# Patient Record
Sex: Female | Born: 1945
Health system: Southern US, Community
[De-identification: ages and names within clinical notes are randomized; demographics above are authoritative.]

## PROBLEM LIST (undated history)

## (undated) DIAGNOSIS — K219 Gastro-esophageal reflux disease without esophagitis: Secondary | ICD-10-CM

## (undated) DIAGNOSIS — T7840XA Allergy, unspecified, initial encounter: Secondary | ICD-10-CM

## (undated) DIAGNOSIS — F32A Depression, unspecified: Secondary | ICD-10-CM

## (undated) DIAGNOSIS — F329 Major depressive disorder, single episode, unspecified: Secondary | ICD-10-CM

## (undated) DIAGNOSIS — R51 Headache: Secondary | ICD-10-CM

## (undated) DIAGNOSIS — C801 Malignant (primary) neoplasm, unspecified: Secondary | ICD-10-CM

## (undated) DIAGNOSIS — F419 Anxiety disorder, unspecified: Secondary | ICD-10-CM

## (undated) DIAGNOSIS — E785 Hyperlipidemia, unspecified: Secondary | ICD-10-CM

## (undated) DIAGNOSIS — I499 Cardiac arrhythmia, unspecified: Secondary | ICD-10-CM

## (undated) DIAGNOSIS — R112 Nausea with vomiting, unspecified: Secondary | ICD-10-CM

## (undated) DIAGNOSIS — K51411 Inflammatory polyps of colon with rectal bleeding: Secondary | ICD-10-CM

## (undated) DIAGNOSIS — I10 Essential (primary) hypertension: Secondary | ICD-10-CM

## (undated) DIAGNOSIS — Z85038 Personal history of other malignant neoplasm of large intestine: Secondary | ICD-10-CM

## (undated) DIAGNOSIS — Z9889 Other specified postprocedural states: Secondary | ICD-10-CM

## (undated) DIAGNOSIS — M1990 Unspecified osteoarthritis, unspecified site: Secondary | ICD-10-CM

## (undated) HISTORY — DX: Hyperlipidemia, unspecified: E78.5

## (undated) HISTORY — DX: Allergy, unspecified, initial encounter: T78.40XA

## (undated) HISTORY — PX: US RT BREAST BX W LOC DEV 1ST LESION IMG BX SPEC US GUIDE: IMG5480

## (undated) HISTORY — DX: Personal history of other malignant neoplasm of large intestine: Z85.038

## (undated) HISTORY — DX: Malignant (primary) neoplasm, unspecified: C80.1

## (undated) HISTORY — PX: APPENDECTOMY: SHX54

## (undated) HISTORY — DX: Headache: R51

## (undated) HISTORY — DX: Inflammatory polyps of colon with rectal bleeding: K51.411

## (undated) HISTORY — PX: OTHER SURGICAL HISTORY: SHX169

---

## 1994-10-18 HISTORY — PX: ABDOMINAL HYSTERECTOMY: SHX81

## 2005-08-30 ENCOUNTER — Ambulatory Visit: Payer: Self-pay | Admitting: Internal Medicine

## 2006-10-18 DIAGNOSIS — K51411 Inflammatory polyps of colon with rectal bleeding: Secondary | ICD-10-CM

## 2006-10-18 HISTORY — PX: COLECTOMY: SHX59

## 2006-10-18 HISTORY — DX: Inflammatory polyps of colon with rectal bleeding: K51.411

## 2006-12-29 LAB — HM MAMMOGRAPHY

## 2007-01-23 ENCOUNTER — Ambulatory Visit: Payer: Self-pay | Admitting: Gastroenterology

## 2007-02-08 ENCOUNTER — Ambulatory Visit: Payer: Self-pay | Admitting: General Surgery

## 2007-02-15 ENCOUNTER — Inpatient Hospital Stay: Payer: Self-pay | Admitting: General Surgery

## 2008-04-09 ENCOUNTER — Ambulatory Visit: Payer: Self-pay | Admitting: General Surgery

## 2010-10-18 HISTORY — PX: COLONOSCOPY: SHX174

## 2010-10-18 HISTORY — PX: FOOT SURGERY: SHX648

## 2011-01-19 LAB — HM COLONOSCOPY

## 2011-01-29 ENCOUNTER — Ambulatory Visit: Payer: Self-pay | Admitting: General Surgery

## 2011-08-06 LAB — HM DEXA SCAN

## 2011-10-19 HISTORY — PX: FOOT SURGERY: SHX648

## 2011-11-29 ENCOUNTER — Emergency Department: Payer: Self-pay | Admitting: Emergency Medicine

## 2011-11-29 LAB — COMPREHENSIVE METABOLIC PANEL
Bilirubin,Total: 0.2 mg/dL (ref 0.2–1.0)
Calcium, Total: 8.8 mg/dL (ref 8.5–10.1)
Chloride: 105 mmol/L (ref 98–107)
Co2: 22 mmol/L (ref 21–32)
Creatinine: 0.63 mg/dL (ref 0.60–1.30)
EGFR (African American): >60
Glucose: 103 mg/dL — ABNORMAL HIGH (ref 65–99)
SGOT(AST): 36 U/L (ref 15–37)
SGPT (ALT): 44 U/L
Sodium: 141 mmol/L (ref 136–145)
Total Protein: 7.7 g/dL (ref 6.4–8.2)

## 2011-11-29 LAB — TROPONIN I: Troponin-I: <0.02 ng/mL

## 2011-11-29 LAB — CBC
HCT: 39.6 % (ref 35.0–47.0)
HGB: 13.7 g/dL (ref 12.0–16.0)
MCH: 33.6 pg (ref 26.0–34.0)
MCHC: 34.5 g/dL (ref 32.0–36.0)
MCV: 98 fL (ref 80–100)
Platelet: 284 10*3/uL (ref 150–440)
RBC: 4.06 10*6/uL (ref 3.80–5.20)
WBC: 6.5 10*3/uL (ref 3.6–11.0)

## 2011-11-29 LAB — PRO B NATRIURETIC PEPTIDE: B-Type Natriuretic Peptide: 82 pg/mL (ref 0–125)

## 2012-12-08 ENCOUNTER — Encounter: Payer: Self-pay | Admitting: *Deleted

## 2013-01-25 ENCOUNTER — Encounter: Payer: Self-pay | Admitting: General Surgery

## 2013-01-31 ENCOUNTER — Other Ambulatory Visit: Payer: Self-pay | Admitting: *Deleted

## 2013-01-31 ENCOUNTER — Ambulatory Visit (INDEPENDENT_AMBULATORY_CARE_PROVIDER_SITE_OTHER): Payer: Medicare Other | Admitting: General Surgery

## 2013-01-31 ENCOUNTER — Encounter: Payer: Self-pay | Admitting: General Surgery

## 2013-01-31 VITALS — BP 130/66 | HR 74 | Resp 12 | Ht 65.0 in | Wt 144.0 lb

## 2013-01-31 DIAGNOSIS — Z1239 Encounter for other screening for malignant neoplasm of breast: Secondary | ICD-10-CM

## 2013-01-31 DIAGNOSIS — Z1231 Encounter for screening mammogram for malignant neoplasm of breast: Secondary | ICD-10-CM

## 2013-01-31 DIAGNOSIS — Z85038 Personal history of other malignant neoplasm of large intestine: Secondary | ICD-10-CM | POA: Insufficient documentation

## 2013-01-31 NOTE — Progress Notes (Signed)
Patient ID: Lisa Caldwell, female   DOB: Jul 04, 1946, 67 y.o.   MRN: [id number]  Chief Complaint  Patient presents with  . Follow-up    mammo    HPI Lisa Caldwell is a 67 y.o. female here today for her annual follow up mammogram.  Patient with known history of right colectomy for polyp with insitu cancer 2008. No new breast complaints and no new issues. HPI  Past Medical History  Diagnosis Date  . Headache   . Personal history of malignant neoplasm of large intestine   . Inflammatory polyps of colon with rectal bleeding 2008  . Rectal bleeding 2008  . Breast screening, unspecified   . Cancer      rt colectomy for a polyp with carcinoma in situ in 2008.     Past Surgical History  Procedure Laterality Date  . Colonoscopy  2012    Dr. Evette Cristal  . Colectomy Right 2008  . Abdominal hysterectomy  1996  . Foot surgery Left 2012  . Foot surgery Left 2013    History reviewed. No pertinent family history.  Social History History  Substance Use Topics  . Smoking status: Never Smoker   . Smokeless tobacco: Not on file  . Alcohol Use: Yes     Comment: beer    Allergies  Allergen Reactions  . Codeine     stomach pain   . Phenacemide     Stomach pains/this was unknown to pt    . Phenergan (Promethazine Hcl) Other (See Comments)    tremors    Current Outpatient Prescriptions  Medication Sig Dispense Refill  . aspirin 81 MG tablet Take 81 mg by mouth daily.      Marland Kitchen buPROPion (WELLBUTRIN) 75 MG tablet Take 75 mg by mouth 2 (two) times daily.      . calcium gluconate 500 MG tablet Take 500 mg by mouth daily.      . cetirizine (ZYRTEC) 10 MG tablet Take 10 mg by mouth daily.      . citalopram (CELEXA) 10 MG tablet Take 10 mg by mouth daily.      Marland Kitchen omeprazole (PRILOSEC) 20 MG capsule Take 20 mg by mouth daily.      . rizatriptan (MAXALT) 10 MG tablet Take 10 mg by mouth as needed for migraine. May repeat in 2 hours if needed       No current facility-administered medications  for this visit.    Review of Systems Review of Systems  Constitutional: Negative.   Respiratory: Negative.   Cardiovascular: Negative.     Blood pressure 130/66, pulse 74, resp. rate 12, height 5\' 5"  (1.651 m), weight 144 lb (65.318 kg).  Physical Exam Physical Exam  Constitutional: She is oriented to person, place, and time. She appears well-developed and well-nourished.  Eyes: Conjunctivae are normal. No scleral icterus.  Neck: No mass and no thyromegaly present.  Cardiovascular: Normal rate, regular rhythm and intact distal pulses.   No edema  Pulmonary/Chest: Effort normal. Right breast exhibits no inverted nipple, no mass, no nipple discharge, no skin change and no tenderness. Left breast exhibits no inverted nipple, no mass, no nipple discharge, no skin change and no tenderness.  Abdominal: Soft. There is no splenomegaly or hepatomegaly. No hernia.    Lymphadenopathy:    She has no cervical adenopathy.    She has no axillary adenopathy.  Neurological: She is oriented to person, place, and time.  Skin: Skin is warm and dry.    Data Reviewed  Mammogram reviewed-BIRADS 2  Assessment    Stable exam.     Plan    21yr f/u         SANKAR,SEEPLAPUTHUR G 01/31/2013, 7:16 PM

## 2013-01-31 NOTE — Patient Instructions (Addendum)
Continue to do self breast exams Colonoscopy due 2017 Call for any abdominal or breast symptoms  Patient will be asked to return to the office in one year for a bilateral screening mammogram at [facility].

## 2013-01-31 NOTE — Progress Notes (Signed)
Patient will be asked to return to the office in one year for a bilateral screening mammogram. This patient prefers to have mammograms at [facility].

## 2013-02-05 ENCOUNTER — Encounter: Payer: Self-pay | Admitting: *Deleted

## 2013-04-03 DIAGNOSIS — F329 Major depressive disorder, single episode, unspecified: Secondary | ICD-10-CM | POA: Insufficient documentation

## 2013-04-03 DIAGNOSIS — J309 Allergic rhinitis, unspecified: Secondary | ICD-10-CM | POA: Insufficient documentation

## 2013-05-23 ENCOUNTER — Other Ambulatory Visit: Payer: Self-pay

## 2013-08-23 ENCOUNTER — Other Ambulatory Visit: Payer: Self-pay

## 2014-01-31 ENCOUNTER — Encounter: Payer: Self-pay | Admitting: General Surgery

## 2014-02-07 ENCOUNTER — Encounter: Payer: Self-pay | Admitting: General Surgery

## 2014-02-07 ENCOUNTER — Ambulatory Visit (INDEPENDENT_AMBULATORY_CARE_PROVIDER_SITE_OTHER): Payer: Medicare HMO | Admitting: General Surgery

## 2014-02-07 VITALS — BP 120/70 | HR 76 | Resp 14 | Ht 64.5 in | Wt 146.0 lb

## 2014-02-07 DIAGNOSIS — Z85038 Personal history of other malignant neoplasm of large intestine: Secondary | ICD-10-CM

## 2014-02-07 DIAGNOSIS — Z1239 Encounter for other screening for malignant neoplasm of breast: Secondary | ICD-10-CM

## 2014-02-07 NOTE — Progress Notes (Signed)
Patient ID: Lisa Caldwell, female   DOB: 11-27-1945, 68 y.o.   MRN: [id number]  Chief Complaint  Patient presents with  . Follow-up    mammogram    HPI Lisa Caldwell is a 68 y.o. female.  who presents for her follow up mammogram and breast evaluation. The most recent mammogram was done on 01-30-14. She is post right colectomy in 2008 for polyp with in situ CA. Patient does perform regular self breast checks and gets regular mammograms done. No new breast issues.   HPI  Past Medical History  Diagnosis Date  . Headache(784.0)   . Personal history of malignant neoplasm of large intestine   . Inflammatory polyps of colon with rectal bleeding 2008  . Rectal bleeding 2008  . Breast screening, unspecified   . Cancer      rt colectomy for a polyp with carcinoma in situ in 2008.     Past Surgical History  Procedure Laterality Date  . Colonoscopy  2012    Dr. Jamal Collin  . Colectomy Right 2008  . Abdominal hysterectomy  1996  . Foot surgery Left 2012  . Foot surgery Left 2013    History reviewed. No pertinent family history.  Social History History  Substance Use Topics  . Smoking status: Never Smoker   . Smokeless tobacco: Not on file  . Alcohol Use: Yes     Comment: beer    Allergies  Allergen Reactions  . Codeine     stomach pain   . Peanut-Containing Drug Products Nausea And Vomiting    Pine nut  . Phenacemide     Stomach pains/this was unknown to pt    . Phenergan [Promethazine Hcl] Other (See Comments)    tremors    Current Outpatient Prescriptions  Medication Sig Dispense Refill  . citalopram (CELEXA) 10 MG tablet Take 10 mg by mouth daily.      . fluticasone (FLONASE) 50 MCG/ACT nasal spray Place into both nostrils daily.      . meclizine (ANTIVERT) 25 MG tablet Take 25 mg by mouth 3 (three) times daily as needed for dizziness.      Lisa Caldwell omeprazole (PRILOSEC) 20 MG capsule Take 20 mg by mouth daily.      . rizatriptan (MAXALT) 10 MG tablet Take 10 mg by mouth  as needed for migraine. May repeat in 2 hours if needed       No current facility-administered medications for this visit.    Review of Systems Review of Systems  Constitutional: Negative.   Respiratory: Negative.   Cardiovascular: Negative.     Blood pressure 120/70, pulse 76, resp. rate 14, height 5' 4.5" (1.638 m), weight 146 lb (66.225 kg).  Physical Exam Physical Exam  Constitutional: She is oriented to person, place, and time. She appears well-developed and well-nourished.  Eyes: Conjunctivae are normal.  Neck: Neck supple.  Cardiovascular: Normal rate, regular rhythm, normal heart sounds, intact distal pulses and normal pulses.   No lower leg edema or varicose veins noted.  Pulmonary/Chest: Effort normal and breath sounds normal. Right breast exhibits no inverted nipple, no mass, no nipple discharge, no skin change and no tenderness. Left breast exhibits no inverted nipple, no mass, no nipple discharge, no skin change and no tenderness.  Abdominal: Soft. Normal appearance. There is no tenderness. No hernia.  Lymphadenopathy:    She has no cervical adenopathy.    She has no axillary adenopathy.  Neurological: She is alert and oriented to person, place, and time.  Skin: Skin is warm and dry.    Data Reviewed Mammogram report reviewed and stable.  Assessment    Stable physical exam. Patient is 7 years post right colectomy.    Plan    Follow up in one year with bilateral screening mammogram and office visit.    Colonoscopy is due 2017.   Tristan Bramble G Pryce Folts 02/08/2014, 5:52 AM

## 2014-02-07 NOTE — Patient Instructions (Addendum)
Continue self breast exams. Call office for any new breast issues or concerns. Follow up in one year with bilateral screening mammogram and office visit. Colonoscopy is due 2017

## 2014-02-08 ENCOUNTER — Encounter: Payer: Self-pay | Admitting: General Surgery

## 2014-03-01 LAB — LIPID PANEL
Cholesterol: 218 mg/dL — AB (ref 0–200)
HDL: 94 mg/dL — AB (ref 35–70)
LDL CALC: 112 mg/dL
LDl/HDL Ratio: 1.2
TRIGLYCERIDES: 59 mg/dL (ref 40–160)

## 2014-03-01 LAB — CBC AND DIFFERENTIAL
HCT: 42 % (ref 36–46)
Hemoglobin: 13.7 g/dL (ref 12.0–16.0)
Neutrophils Absolute: 4 /uL
Platelets: 257 10*3/uL (ref 150–399)
WBC: 5.5 10*3/mL

## 2014-03-01 LAB — TSH: TSH: 1.92 u[IU]/mL (ref 0.41–5.90)

## 2014-03-01 LAB — BASIC METABOLIC PANEL
BUN: 13 mg/dL (ref 4–21)
CREATININE: 0.8 mg/dL (ref 0.5–1.1)
Glucose: 106 mg/dL
POTASSIUM: 5.7 mmol/L — AB (ref 3.4–5.3)
Sodium: 146 mmol/L (ref 137–147)

## 2014-03-01 LAB — HEPATIC FUNCTION PANEL
ALK PHOS: 72 U/L (ref 25–125)
ALT: 27 U/L (ref 7–35)
AST: 30 U/L (ref 13–35)
Bilirubin, Total: 0.6 mg/dL

## 2014-03-01 LAB — HEMOGLOBIN A1C: Hgb A1c MFr Bld: 5.6 % (ref 4.0–6.0)

## 2014-08-19 ENCOUNTER — Encounter: Payer: Self-pay | Admitting: General Surgery

## 2015-02-03 ENCOUNTER — Ambulatory Visit
Admit: 2015-02-03 | Disposition: A | Discharge status: Home / Self Care | Payer: Self-pay | Attending: General Surgery | Admitting: General Surgery

## 2015-02-03 DIAGNOSIS — Z1231 Encounter for screening mammogram for malignant neoplasm of breast: Secondary | ICD-10-CM | POA: Diagnosis not present

## 2015-02-04 ENCOUNTER — Encounter: Payer: Self-pay | Admitting: General Surgery

## 2015-02-17 ENCOUNTER — Ambulatory Visit (INDEPENDENT_AMBULATORY_CARE_PROVIDER_SITE_OTHER): Payer: Commercial Managed Care - HMO | Admitting: General Surgery

## 2015-02-17 ENCOUNTER — Encounter: Payer: Self-pay | Admitting: General Surgery

## 2015-02-17 VITALS — Ht 64.0 in | Wt 154.0 lb

## 2015-02-17 DIAGNOSIS — Z85038 Personal history of other malignant neoplasm of large intestine: Secondary | ICD-10-CM | POA: Diagnosis not present

## 2015-02-17 DIAGNOSIS — Z1239 Encounter for other screening for malignant neoplasm of breast: Secondary | ICD-10-CM

## 2015-02-17 NOTE — Progress Notes (Signed)
Patient ID: Lisa Caldwell, female   DOB: 12-30-1945, 69 y.o.   MRN: [id number]  Chief Complaint  Patient presents with  . Follow-up    mammogram    HPI [name] is a 69 y.o. female who presents for a breast evaluation and colon cancer follow up. The most recent mammogram was done on 02/03/15.Patient does perform regular self breast checks and gets regular mammograms done. She reports no new breast problems. No GI complaints  HPI  Past Medical History  Diagnosis Date  . Headache(784.0)   . Personal history of malignant neoplasm of large intestine   . Inflammatory polyps of colon with rectal bleeding 2008  . Rectal bleeding 2008  . Breast screening, unspecified   . Cancer 2008     rt colectomy for a polyp with carcinoma in situ     Past Surgical History  Procedure Laterality Date  . Colonoscopy  2012    Dr. Jamal Collin  . Colectomy Right 2008  . Abdominal hysterectomy  1996  . Foot surgery Left 2012  . Foot surgery Left 2013    History reviewed. No pertinent family history.  Social History History  Substance Use Topics  . Smoking status: Never Smoker   . Smokeless tobacco: Never Used  . Alcohol Use: 0.0 oz/week    0 Standard drinks or equivalent per week     Comment: beer    Allergies  Allergen Reactions  . Codeine     stomach pain   . Peanut-Containing Drug Products Nausea And Vomiting    Pine nut  . Phenacemide     Stomach pains/this was unknown to pt    . Phenergan [Promethazine Hcl] Other (See Comments)    tremors    Current Outpatient Prescriptions  Medication Sig Dispense Refill  . citalopram (CELEXA) 10 MG tablet Take 10 mg by mouth daily.    . fluticasone (FLONASE) 50 MCG/ACT nasal spray Place into both nostrils daily.    . meclizine (ANTIVERT) 25 MG tablet Take 25 mg by mouth 3 (three) times daily as needed for dizziness.    Marland Kitchen omeprazole (PRILOSEC) 20 MG capsule Take 20 mg by mouth daily.    . rizatriptan (MAXALT) 10 MG tablet Take 10 mg by  mouth as needed for migraine. May repeat in 2 hours if needed     No current facility-administered medications for this visit.    Review of Systems Review of Systems  Constitutional: Negative.   Respiratory: Negative.   Cardiovascular: Negative.     Height 5\' 4"  (1.626 m), weight 154 lb (69.854 kg).  Physical Exam Physical Exam  Constitutional: She is oriented to person, place, and time. She appears well-developed and well-nourished.  Eyes: Conjunctivae are normal. No scleral icterus.  Neck: Neck supple.  Cardiovascular: Normal rate, regular rhythm and normal heart sounds.   Pulmonary/Chest: Effort normal and breath sounds normal. Right breast exhibits no inverted nipple, no mass, no nipple discharge, no skin change and no tenderness. Left breast exhibits no inverted nipple, no mass, no nipple discharge, no skin change and no tenderness.  Abdominal: Soft. Normal appearance and bowel sounds are normal. There is no hepatomegaly. There is no tenderness. No hernia.  Lymphadenopathy:    She has no cervical adenopathy.    She has no axillary adenopathy.  Neurological: She is alert and oriented to person, place, and time.    Data Reviewed Mammogram stable   Assessment    Stable exam. History of right colon polyp with in  situ cancer. S/P right colectomy 2008.    Plan    Follow up in one year with bilateral screening mammogram. Colonoscopy due next year.      PCP:  Otho Darner 02/18/2015, 5:32 AM

## 2015-02-17 NOTE — Patient Instructions (Signed)
Patient will be asked to return to the office in one year with a bilateral screening mammogram. 

## 2015-02-18 ENCOUNTER — Encounter: Payer: Self-pay | Admitting: General Surgery

## 2015-03-04 DIAGNOSIS — G43909 Migraine, unspecified, not intractable, without status migrainosus: Secondary | ICD-10-CM | POA: Insufficient documentation

## 2015-03-04 DIAGNOSIS — E875 Hyperkalemia: Secondary | ICD-10-CM | POA: Insufficient documentation

## 2015-03-04 DIAGNOSIS — Z131 Encounter for screening for diabetes mellitus: Secondary | ICD-10-CM | POA: Insufficient documentation

## 2015-03-04 DIAGNOSIS — R739 Hyperglycemia, unspecified: Secondary | ICD-10-CM | POA: Insufficient documentation

## 2015-03-04 DIAGNOSIS — K219 Gastro-esophageal reflux disease without esophagitis: Secondary | ICD-10-CM | POA: Insufficient documentation

## 2015-03-04 DIAGNOSIS — K5730 Diverticulosis of large intestine without perforation or abscess without bleeding: Secondary | ICD-10-CM | POA: Insufficient documentation

## 2015-03-04 DIAGNOSIS — F4320 Adjustment disorder, unspecified: Secondary | ICD-10-CM | POA: Insufficient documentation

## 2015-03-04 DIAGNOSIS — M2040 Other hammer toe(s) (acquired), unspecified foot: Secondary | ICD-10-CM | POA: Insufficient documentation

## 2015-05-05 ENCOUNTER — Ambulatory Visit (INDEPENDENT_AMBULATORY_CARE_PROVIDER_SITE_OTHER): Payer: Commercial Managed Care - HMO | Admitting: Family Medicine

## 2015-05-05 ENCOUNTER — Encounter: Payer: Self-pay | Admitting: Family Medicine

## 2015-05-05 VITALS — BP 122/84 | HR 80 | Temp 98.1°F | Resp 20 | Ht 64.0 in | Wt 152.0 lb

## 2015-05-05 DIAGNOSIS — Z0000 Encounter for general adult medical examination without abnormal findings: Secondary | ICD-10-CM | POA: Diagnosis not present

## 2015-05-05 DIAGNOSIS — E785 Hyperlipidemia, unspecified: Secondary | ICD-10-CM

## 2015-05-05 DIAGNOSIS — R739 Hyperglycemia, unspecified: Secondary | ICD-10-CM

## 2015-05-05 DIAGNOSIS — R7309 Other abnormal glucose: Secondary | ICD-10-CM

## 2015-05-05 DIAGNOSIS — E875 Hyperkalemia: Secondary | ICD-10-CM | POA: Diagnosis not present

## 2015-05-05 DIAGNOSIS — Z23 Encounter for immunization: Secondary | ICD-10-CM

## 2015-05-05 NOTE — Progress Notes (Signed)
Patient ID: Lisa Caldwell, female   DOB: 04/23/1946, 70 y.o.   MRN: [id number]       Patient: Lisa Caldwell, Female    DOB: 1946/06/02, 69 y.o.   MRN: [id number] Visit Date: 05/05/2015  Today's Provider: Margarita Rana, MD   Chief Complaint  Patient presents with  . Medicare Wellness   Subjective:    Annual wellness visit Lisa Caldwell is a 69 y.o. female who presents today for her Subsequent Annual Wellness Visit. She feels well. She reports exercising 2 days a weeks aerobics and light wight. She reports she is sleeping well, 7-8 hours of sleep. 03/01/14 CPE 02/03/15 Mammogram-[facility]-RADS 1 01/29/11 Colonoscopy-Diverticulosis, recheck in 2017 08/06/11 BMD-osteopenia   Lab Results  Component Value Date   WBC 5.5 03/01/2014   HGB 13.7 03/01/2014   HCT 42 03/01/2014   PLT 257 03/01/2014   GLUCOSE 103* 11/29/2011   CHOL 218* 03/01/2014   TRIG 59 03/01/2014   HDL 94* 03/01/2014   LDLCALC 112 03/01/2014   ALT 27 03/01/2014   AST 30 03/01/2014   NA 146 03/01/2014   K 5.7* 03/01/2014   CL 105 11/29/2011   CREATININE 0.8 03/01/2014   BUN 13 03/01/2014   CO2 22 11/29/2011   TSH 1.92 03/01/2014   HGBA1C 5.6 03/01/2014    -----------------------------------------------------------   Review of Systems  Constitutional: Positive for activity change.  HENT: Negative.   Eyes: Negative.   Respiratory: Negative.   Cardiovascular: Negative.   Gastrointestinal: Negative.   Endocrine: Negative.   Genitourinary: Negative.   Musculoskeletal: Negative.   Skin: Negative.   Allergic/Immunologic: Negative.   Neurological: Negative.   Hematological: Negative.   Psychiatric/Behavioral: Negative.     History   Social History  . Marital Status: Married    Spouse Name: Lisa Caldwell  . Number of Children: 2  . Years of Education: N/A   Occupational History  . Retired    Social History Main Topics  . Smoking status: Never Smoker   . Smokeless tobacco: Never Used  . Alcohol Use: 12.0  oz/week    0 Standard drinks or equivalent, 20 Cans of beer per week     Comment: beer  . Drug Use: No  . Sexual Activity: Not on file   Other Topics Concern  . Not on file   Social History Narrative    Patient Active Problem List   Diagnosis Date Noted  . Adaptation reaction 03/04/2015  . Screening for diabetes mellitus 03/04/2015  . Colon, diverticulosis 03/04/2015  . Elevated blood sugar 03/04/2015  . Acid reflux 03/04/2015  . High potassium 03/04/2015  . Hammer toe 03/04/2015  . Headache, migraine 03/04/2015  . Allergic rhinitis 04/03/2013  . Clinical depression 04/03/2013  . History of colon cancer 01/31/2013    Past Surgical History  Procedure Laterality Date  . Colonoscopy  2012    Dr. Jamal Caldwell  . Colectomy Right 2008  . Abdominal hysterectomy  1996  . Foot surgery Left 2012  . Foot surgery Left 2013  . Appendectomy      Her family history includes Drug abuse in her brother; Heart disease in her father and mother.    Previous Medications   CITALOPRAM (CELEXA) 20 MG TABLET    Take 20 mg by mouth daily.    FLUTICASONE (FLONASE) 50 MCG/ACT NASAL SPRAY    Place 2 sprays into both nostrils daily.    MECLIZINE (ANTIVERT) 12.5 MG TABLET    Take 12.5 mg by mouth as needed.  OMEPRAZOLE (PRILOSEC) 20 MG CAPSULE    Take 20 mg by mouth daily.   RIZATRIPTAN (MAXALT) 10 MG TABLET    Take 10 mg by mouth as needed for migraine. May repeat in 2 hours if needed    Patient Care Team: Lisa Rana, MD as PCP - General (Family Medicine) Lisa Lisa Haines, MD (General Surgery) Lisa Rana, MD as Referring Physician (Family Medicine)     Objective:   Vitals: BP 122/84 mmHg  Pulse 80  Temp(Src) 98.1 F (36.7 C) (Oral)  Resp 20  Ht 5\' 4"  (1.626 m)  Wt 152 lb (68.947 kg)  BMI 26.08 kg/m2  Physical Exam  Constitutional: She is oriented to person, place, and time. She appears well-developed and well-nourished.  HENT:  Head: Normocephalic and atraumatic.  Right  Ear: Tympanic membrane, external ear and ear canal normal.  Left Ear: Tympanic membrane, external ear and ear canal normal.  Nose: Nose normal.  Mouth/Throat: Uvula is midline, oropharynx is clear and moist and mucous membranes are normal.  Eyes: Conjunctivae, EOM and lids are normal. Pupils are equal, round, and reactive to light.  Neck: Trachea normal and normal range of motion. Neck supple. Carotid bruit is not present. No thyroid mass and no thyromegaly present.  Cardiovascular: Normal rate, regular rhythm and normal heart sounds.   Pulmonary/Chest: Effort normal and breath sounds normal.  Abdominal: Soft. Normal appearance and bowel sounds are normal. There is no hepatosplenomegaly. There is no tenderness.  Genitourinary: No breast swelling, tenderness or discharge.  Musculoskeletal: Normal range of motion.  Lymphadenopathy:    She has no cervical adenopathy.    She has no axillary adenopathy.  Neurological: She is alert and oriented to person, place, and time. She has normal strength. No cranial nerve deficit.  Skin: Skin is warm, dry and intact.  Psychiatric: She has a normal mood and affect. Her speech is normal and behavior is normal. Judgment and thought content normal. Cognition and memory are normal.    Activities of Daily Living In your present state of health, do you have any difficulty performing the following activities: 05/05/2015  Hearing? N  Vision? N  Difficulty concentrating or making decisions? N  Walking or climbing stairs? N  Dressing or bathing? N  Doing errands, shopping? N    Fall Risk Assessment Fall Risk  05/05/2015  Falls in the past year? No     Depression Screen PHQ 2/9 Scores 05/05/2015  PHQ - 2 Score 0    Cognitive Testing - 6-CIT  Correct? Score   What year is it? yes 0 0 or 4  What month is it? yes 0 0 or 3  Memorize:    Lisa Caldwell,  42,  [place],      What time is it? (within 1 hour) yes 0 0 or 3  Count backwards from 20 yes  0 0, 2, or 4  Name the months of the year yes 0 0, 2, or 4  Repeat name & address above yes 0 0, 2, 4, 6, 8, or 10       TOTAL SCORE  0/28   Interpretation:  Normal  Normal (0-7) Abnormal (8-28)       Assessment & Plan:     Annual Wellness Visit  Reviewed patient's Family Medical History Reviewed and updated list of patient's medical providers Assessment of cognitive impairment was done Assessed patient's functional ability Established a written schedule for health screening [place] Completed and  Reviewed  Exercise Activities and Dietary recommendations Goals    . Exercise 150 minutes per week (moderate activity)    . Reduce alcohol intake to 1 serving a day.        Immunization History  Administered Date(s) Administered  . Pneumococcal Conjugate-13 05/05/2015  . Pneumococcal Polysaccharide-23 08/27/2011  . Tdap 01/11/2006  . Zoster 08/17/2011    Health Maintenance  Topic Date Due  . PNA vac Low Risk Adult (2 of 2 - PCV13) 08/26/2012  . INFLUENZA VACCINE  05/19/2015  . TETANUS/TDAP  01/12/2016  . MAMMOGRAM  02/02/2017  . COLONOSCOPY  01/18/2021  . DEXA SCAN  Completed  . ZOSTAVAX  Completed         ------------------------------------------------------------------------------------------------------------  Patient was seen and examined by Jerrell Belfast, MD, and note scribed by Lynford Humphrey, [place].    I have reviewed the document for accuracy and completeness and I agree with above. Jerrell Belfast, MD    Lisa Rana, MD

## 2015-05-06 ENCOUNTER — Telehealth: Payer: Self-pay

## 2015-05-06 LAB — COMPREHENSIVE METABOLIC PANEL
A/G RATIO: 1.5 (ref 1.1–2.5)
ALT: 27 IU/L (ref 0–32)
AST: 27 IU/L (ref 0–40)
Albumin: 4.3 g/dL (ref 3.6–4.8)
Alkaline Phosphatase: 70 IU/L (ref 39–117)
BILIRUBIN TOTAL: 0.5 mg/dL (ref 0.0–1.2)
BUN/Creatinine Ratio: 13 (ref 11–26)
BUN: 10 mg/dL (ref 8–27)
CALCIUM: 9.6 mg/dL (ref 8.7–10.3)
CHLORIDE: 103 mmol/L (ref 97–108)
CO2: 25 mmol/L (ref 18–29)
Creatinine, Ser: 0.79 mg/dL (ref 0.57–1.00)
GFR calc Af Amer: 88 mL/min/{1.73_m2} (ref >59)
GFR calc non Af Amer: 77 mL/min/{1.73_m2} (ref >59)
GLOBULIN, TOTAL: 2.9 g/dL (ref 1.5–4.5)
GLUCOSE: 100 mg/dL — AB (ref 65–99)
Potassium: 4.1 mmol/L (ref 3.5–5.2)
SODIUM: 143 mmol/L (ref 134–144)
Total Protein: 7.2 g/dL (ref 6.0–8.5)

## 2015-05-06 LAB — LIPID PANEL WITH LDL/HDL RATIO
CHOLESTEROL TOTAL: 217 mg/dL — AB (ref 100–199)
HDL: 88 mg/dL (ref >39)
LDL Calculated: 113 mg/dL — ABNORMAL HIGH (ref 0–99)
LDl/HDL Ratio: 1.3 ratio units (ref 0.0–3.2)
TRIGLYCERIDES: 80 mg/dL (ref 0–149)
VLDL Cholesterol Cal: 16 mg/dL (ref 5–40)

## 2015-05-06 LAB — HEMOGLOBIN A1C
Est. average glucose Bld gHb Est-mCnc: 103 mg/dL
HEMOGLOBIN A1C: 5.2 % (ref 4.8–5.6)

## 2015-05-06 LAB — TSH: TSH: 2.07 u[IU]/mL (ref 0.450–4.500)

## 2015-05-06 NOTE — Telephone Encounter (Signed)
Advised pt of lab results. Pt verbally acknowledges understanding. Emily Drozdowski, CMA   

## 2015-05-06 NOTE — Telephone Encounter (Signed)
-----   Message from Margarita Rana, MD sent at 05/06/2015  8:01 AM EDT ----- Labs stable.  Cholesterol elevated, but has high good cholesterol.  Proceed with lifestyle changes and recheck labs annually. Thanks.

## 2015-05-06 NOTE — Telephone Encounter (Signed)
LMTCB 05/06/2015  Thanks,   -Mickel Baas

## 2015-05-21 ENCOUNTER — Encounter: Payer: Self-pay | Admitting: Family Medicine

## 2015-05-21 ENCOUNTER — Ambulatory Visit (INDEPENDENT_AMBULATORY_CARE_PROVIDER_SITE_OTHER): Payer: Commercial Managed Care - HMO | Admitting: Family Medicine

## 2015-05-21 VITALS — BP 118/84 | HR 84 | Temp 98.4°F | Resp 16 | Wt 152.0 lb

## 2015-05-21 DIAGNOSIS — R591 Generalized enlarged lymph nodes: Secondary | ICD-10-CM

## 2015-05-21 DIAGNOSIS — R590 Localized enlarged lymph nodes: Secondary | ICD-10-CM

## 2015-05-21 DIAGNOSIS — R599 Enlarged lymph nodes, unspecified: Secondary | ICD-10-CM

## 2015-05-21 NOTE — Progress Notes (Signed)
Patient ID: Lisa Caldwell, female   DOB: [birth year], 69 y.o.   MRN: [id number]        Patient: Lisa Caldwell Female    DOB: [birth year]   69 y.o.   MRN: [id number] Visit Date: 05/21/2015  Today's Provider: Margarita Rana, MD     Chief Complaint  Patient presents with  . Lymphadenopathy      Subjective:    HPI   Pt come in today complaining of a swollen/knot on her right neck.  She reports first noticing it a little over a week ago.  She says it is not changing in size, and is not tender to touch.  Does have history of colon cancer. No systemic symptoms. Feels well.      Allergies  Allergen Reactions  . Codeine     stomach pain   . Peanut-Containing Drug Products Nausea And Vomiting    Pine nut  . Phenacemide     Stomach pains/this was unknown to pt    . Phenergan [Promethazine Hcl] Other (See Comments)    tremors   Previous Medications   CITALOPRAM (CELEXA) 20 MG TABLET    Take 20 mg by mouth daily.    FLUTICASONE (FLONASE) 50 MCG/ACT NASAL SPRAY    Place 2 sprays into both nostrils daily.    MECLIZINE (ANTIVERT) 12.5 MG TABLET    Take 12.5 mg by mouth as needed.    OMEPRAZOLE (PRILOSEC) 20 MG CAPSULE    Take 20 mg by mouth daily.   RIZATRIPTAN (MAXALT) 10 MG TABLET    Take 10 mg by mouth as needed for migraine. May repeat in 2 hours if needed    Review of Systems  Constitutional: Negative.   HENT: Negative.   Musculoskeletal: Negative for myalgias, back pain, joint swelling, arthralgias, gait problem, neck pain and neck stiffness.  Allergic/Immunologic: Negative for environmental allergies and food allergies.  Neurological: Negative for dizziness, light-headedness, numbness and headaches.    History  Substance Use Topics  . Smoking status: Never Smoker   . Smokeless tobacco: Never Used  . Alcohol Use: 12.0 oz/week    0 Standard drinks or equivalent, 20 Cans of beer per week     Comment: beer   Objective:   BP 118/84 mmHg  Pulse 84  Temp(Src) 98.4 F  (36.9 C) (Oral)  Resp 16  Wt 152 lb (68.947 kg)  Physical Exam  Constitutional: She appears well-developed and well-nourished.  Neck: Normal range of motion. Neck supple.  Lymphadenopathy:    She has cervical adenopathy (palpalbe one cm firm nodule right of  mid-line, not symmetric. ).      Assessment & Plan:     1. Lymphadenopathy of right cervical region Will refer to Dr. Jamal Collin who patient has seen before for further evaluation. Appointment scheduled for tomorrow. - Ambulatory referral to [place], MD        Margarita Rana, MD  [facility]

## 2015-05-22 ENCOUNTER — Ambulatory Visit (INDEPENDENT_AMBULATORY_CARE_PROVIDER_SITE_OTHER): Payer: Commercial Managed Care - HMO | Admitting: General Surgery

## 2015-05-22 ENCOUNTER — Encounter: Payer: Self-pay | Admitting: General Surgery

## 2015-05-22 ENCOUNTER — Other Ambulatory Visit: Payer: Commercial Managed Care - HMO

## 2015-05-22 VITALS — BP 140/70 | HR 74 | Resp 12 | Ht 64.0 in | Wt 151.0 lb

## 2015-05-22 DIAGNOSIS — R221 Localized swelling, mass and lump, neck: Secondary | ICD-10-CM

## 2015-05-22 NOTE — Progress Notes (Signed)
Patient ID: Lisa Caldwell, female   DOB: May 05, 1946, 69 y.o.   MRN: [id number]  Chief Complaint  Patient presents with  . Other    Knot on right neck    HPI Lisa Caldwell is a 69 y.o. female here today for evaluation of a nodule in right neck region noticed by the patient a little over one week ago. No tenderness, no change in size, no difficulty swallowing.  HPI  Past Medical History  Diagnosis Date  . Headache(784.0)   . Personal history of malignant neoplasm of large intestine   . Inflammatory polyps of colon with rectal bleeding 2008  . Rectal bleeding 2008  . Breast screening, unspecified   . Cancer 2008     rt colectomy for a polyp with carcinoma in situ   . Allergy     Past Surgical History  Procedure Laterality Date  . Colonoscopy  2012    Dr. Jamal Collin  . Colectomy Right 2008  . Abdominal hysterectomy  1996  . Foot surgery Left 2012  . Foot surgery Left 2013  . Appendectomy      Family History  Problem Relation Age of Onset  . Heart disease Mother   . Heart disease Father   . Drug abuse Brother     Social History History  Substance Use Topics  . Smoking status: Never Smoker   . Smokeless tobacco: Never Used  . Alcohol Use: 12.0 oz/week    0 Standard drinks or equivalent, 20 Cans of beer per week     Comment: beer    Allergies  Allergen Reactions  . Codeine     stomach pain   . Peanut-Containing Drug Products Nausea And Vomiting    Pine nut  . Phenergan [Promethazine Hcl] Other (See Comments)    tremors    Current Outpatient Prescriptions  Medication Sig Dispense Refill  . citalopram (CELEXA) 20 MG tablet Take 20 mg by mouth daily.     . fluticasone (FLONASE) 50 MCG/ACT nasal spray Place 2 sprays into both nostrils daily.     . meclizine (ANTIVERT) 12.5 MG tablet Take 12.5 mg by mouth as needed.     Marland Kitchen omeprazole (PRILOSEC) 20 MG capsule Take 20 mg by mouth daily.    . rizatriptan (MAXALT) 10 MG tablet Take 10 mg by mouth as needed for  migraine. May repeat in 2 hours if needed     No current facility-administered medications for this visit.    Review of Systems Review of Systems  Constitutional: Negative.   Respiratory: Negative.   Cardiovascular: Negative.     Blood pressure 140/70, pulse 74, resp. rate 12, height 5\' 4"  (1.626 m), weight 151 lb (68.493 kg).  Physical Exam Physical Exam  Constitutional: She is oriented to person, place, and time. She appears well-developed and well-nourished.  HENT:  Mouth/Throat: Oropharynx is clear and moist.  Eyes: Conjunctivae are normal. No scleral icterus.  Neck: Neck supple.    Cardiovascular: Normal rate, regular rhythm and normal heart sounds.   Pulmonary/Chest: Effort normal and breath sounds normal.  Lymphadenopathy:    She has no cervical adenopathy.  Neurological: She is alert and oriented to person, place, and time.  Skin: Skin is warm and dry.    Data Reviewed Notes reviewed.  [place] of neck shows cystic appearing mass on either side of the trachea. Right side slightly more prominent.   Assessment    Likely submental salivary gland. Both are soft in feel and benign appearing on  [place]. Will follow up in one month    Plan    Follow up in one month     PCP:  Otho Darner 05/22/2015, 3:44 PM

## 2015-07-01 ENCOUNTER — Telehealth: Payer: Self-pay | Admitting: Family Medicine

## 2015-07-01 ENCOUNTER — Ambulatory Visit (INDEPENDENT_AMBULATORY_CARE_PROVIDER_SITE_OTHER): Payer: Commercial Managed Care - HMO | Admitting: General Surgery

## 2015-07-01 ENCOUNTER — Encounter: Payer: Self-pay | Admitting: General Surgery

## 2015-07-01 VITALS — BP 138/86 | HR 66 | Resp 16 | Ht 64.0 in | Wt 155.2 lb

## 2015-07-01 DIAGNOSIS — R221 Localized swelling, mass and lump, neck: Secondary | ICD-10-CM | POA: Diagnosis not present

## 2015-07-01 DIAGNOSIS — R591 Generalized enlarged lymph nodes: Secondary | ICD-10-CM

## 2015-07-01 DIAGNOSIS — K111 Hypertrophy of salivary gland: Secondary | ICD-10-CM | POA: Insufficient documentation

## 2015-07-01 NOTE — Telephone Encounter (Signed)
Dr Jamal Collin recommended referral to [facility].   Please refer. Thanks.

## 2015-07-01 NOTE — Progress Notes (Signed)
Patient ID: Lisa Caldwell, female   DOB: 01-13-1946, 69 y.o.   MRN: [id number]  Chief Complaint  Patient presents with  . Follow-up    neck mass    HPI Lisa Caldwell is a 69 y.o. female here today for follow up of a nodule in right neck region noticed by the patient about 6 weeks ago. She has noticed that the mass will go down in swelling and then the swelling will rise again. She does think it swells when she has been eating. She can feel it when she has her head down. There is no pain associated with the mass. She states she has been getting dry throat often. HPI  Past Medical History  Diagnosis Date  . Headache(784.0)   . Personal history of malignant neoplasm of large intestine   . Inflammatory polyps of colon with rectal bleeding 2008  . Rectal bleeding 2008  . Breast screening, unspecified   . Cancer 2008     rt colectomy for a polyp with carcinoma in situ   . Allergy     Past Surgical History  Procedure Laterality Date  . Colonoscopy  2012    Dr. Jamal Collin  . Colectomy Right 2008  . Abdominal hysterectomy  1996  . Foot surgery Left 2012  . Foot surgery Left 2013  . Appendectomy      Family History  Problem Relation Age of Onset  . Heart disease Mother   . Heart disease Father   . Drug abuse Brother     Social History Social History  Substance Use Topics  . Smoking status: Never Smoker   . Smokeless tobacco: Never Used  . Alcohol Use: 12.0 oz/week    0 Standard drinks or equivalent, 20 Cans of beer per week     Comment: beer    Allergies  Allergen Reactions  . Codeine     stomach pain   . Peanut-Containing Drug Products Nausea And Vomiting    Pine nut  . Phenergan [Promethazine Hcl] Other (See Comments)    tremors    Current Outpatient Prescriptions  Medication Sig Dispense Refill  . citalopram (CELEXA) 20 MG tablet Take 20 mg by mouth daily.     . fluticasone (FLONASE) 50 MCG/ACT nasal spray Place 2 sprays into both nostrils daily.     .  meclizine (ANTIVERT) 12.5 MG tablet Take 12.5 mg by mouth as needed.     Marland Kitchen omeprazole (PRILOSEC) 20 MG capsule Take 20 mg by mouth daily.    . rizatriptan (MAXALT) 10 MG tablet Take 10 mg by mouth as needed for migraine. May repeat in 2 hours if needed     No current facility-administered medications for this visit.    Review of Systems Review of Systems  Constitutional: Negative.   Respiratory: Negative.   Cardiovascular: Negative.     Blood pressure 138/86, pulse 66, resp. rate 16, height 5\' 4"  (1.626 m), weight 155 lb 3.2 oz (70.398 kg).  Physical Exam Physical Exam No change in the mild swelling in right submental area-subtle soft swelling. No lymphadenopathy. Julien Girt is central Data Reviewed Prior notes  Assessment   Likely mildly swollen submental glands.  .     Plan    Referral to ENT- needs referral by PCP per patient's insurance     PCP: Dr. Phineas Semen 07/01/2015, 10:01 AM

## 2015-07-09 ENCOUNTER — Other Ambulatory Visit: Payer: Self-pay | Admitting: Otolaryngology

## 2015-07-09 DIAGNOSIS — Q892 Congenital malformations of other endocrine glands: Secondary | ICD-10-CM | POA: Diagnosis not present

## 2015-07-09 DIAGNOSIS — R221 Localized swelling, mass and lump, neck: Secondary | ICD-10-CM

## 2015-07-15 ENCOUNTER — Ambulatory Visit
Admission: RE | Admit: 2015-07-15 | Discharge: 2015-07-15 | Disposition: A | Discharge status: Home / Self Care | Payer: Commercial Managed Care - HMO | Source: Ambulatory Visit | Attending: Otolaryngology | Admitting: Otolaryngology

## 2015-07-15 DIAGNOSIS — Q892 Congenital malformations of other endocrine glands: Secondary | ICD-10-CM | POA: Insufficient documentation

## 2015-07-15 DIAGNOSIS — R221 Localized swelling, mass and lump, neck: Secondary | ICD-10-CM | POA: Diagnosis not present

## 2015-07-15 MED ORDER — IOHEXOL 300 MG/ML  SOLN
75.0000 mL | Freq: Once | INTRAMUSCULAR | Status: AC | PRN
  Administered 2015-07-15: 75 mL via INTRAVENOUS

## 2015-07-19 ENCOUNTER — Ambulatory Visit (INDEPENDENT_AMBULATORY_CARE_PROVIDER_SITE_OTHER): Payer: Commercial Managed Care - HMO

## 2015-07-19 DIAGNOSIS — Z23 Encounter for immunization: Secondary | ICD-10-CM

## 2015-07-22 DIAGNOSIS — Q892 Congenital malformations of other endocrine glands: Secondary | ICD-10-CM | POA: Diagnosis not present

## 2015-07-23 ENCOUNTER — Telehealth: Payer: Self-pay

## 2015-07-23 NOTE — Telephone Encounter (Signed)
LMTCB to schedule appointment for pre-op clearance to obtain EKG, 02 sats, etc. Lisa Caldwell, CMA

## 2015-07-24 ENCOUNTER — Telehealth: Payer: Self-pay | Admitting: Family Medicine

## 2015-07-25 ENCOUNTER — Ambulatory Visit (INDEPENDENT_AMBULATORY_CARE_PROVIDER_SITE_OTHER): Payer: Commercial Managed Care - HMO | Admitting: Family Medicine

## 2015-07-25 ENCOUNTER — Encounter: Payer: Self-pay | Admitting: Family Medicine

## 2015-07-25 VITALS — BP 132/76 | HR 80 | Temp 98.4°F | Resp 16 | Ht 64.0 in | Wt 155.0 lb

## 2015-07-25 DIAGNOSIS — Q892 Congenital malformations of other endocrine glands: Secondary | ICD-10-CM

## 2015-07-25 DIAGNOSIS — F329 Major depressive disorder, single episode, unspecified: Secondary | ICD-10-CM

## 2015-07-25 DIAGNOSIS — K219 Gastro-esophageal reflux disease without esophagitis: Secondary | ICD-10-CM | POA: Diagnosis not present

## 2015-07-25 DIAGNOSIS — Z01818 Encounter for other preprocedural examination: Secondary | ICD-10-CM

## 2015-07-25 DIAGNOSIS — F32A Depression, unspecified: Secondary | ICD-10-CM

## 2015-07-25 MED ORDER — OMEPRAZOLE 20 MG PO CPDR: 20.0000 mg | DELAYED_RELEASE_CAPSULE | Freq: Every day | ORAL | Status: DC

## 2015-07-25 MED ORDER — CITALOPRAM HYDROBROMIDE 20 MG PO TABS: 20.0000 mg | ORAL_TABLET | Freq: Every day | ORAL | Status: DC

## 2015-07-25 NOTE — Progress Notes (Signed)
Subjective:    Patient ID: Lisa Caldwell, female    DOB: November 21, 1945, 69 y.o.   MRN: [id number]  HPI  Pre-Operative Examination Pt is preparing to have a procedure to remove thyroglossal cyst duct on 09/04/2015. The surgeon will be Dr. Lula Olszewski. Pt denies use of ASA or blood thinners. Pt reports she is feeling well with no complaints. Lesion is getting bigger with some increased dry throat and increased swallowing issues.   Pt denies previous adverse reactions to anesthesia.  No risks identified.  Also needs other medication refilled. Reflux and mood stable. Taking her medication without any difficulty.    No other concerns today.   Review of Systems  Constitutional: Negative for fever, chills, diaphoresis, activity change, appetite change and unexpected weight change.  HENT: Positive for trouble swallowing. Negative for congestion, facial swelling and rhinorrhea (Dry throat. ).   Respiratory: Negative for cough, choking, chest tightness and shortness of breath.   Cardiovascular: Negative for chest pain and leg swelling.   BP 132/76 mmHg  Pulse 80  Temp(Src) 98.4 F (36.9 C) (Oral)  Resp 16  Ht 5\' 4"  (1.626 m)  Wt 155 lb (70.308 kg)  BMI 26.59 kg/m2  SpO2 97%   Patient Active Problem List   Diagnosis Date Noted  . Salivary gland enlargement 07/01/2015  . Lymphadenopathy of head and neck 05/21/2015  . Adaptation reaction 03/04/2015  . Screening for diabetes mellitus 03/04/2015  . Colon, diverticulosis 03/04/2015  . Elevated blood sugar 03/04/2015  . Acid reflux 03/04/2015  . High potassium 03/04/2015  . Hammer toe 03/04/2015  . Headache, migraine 03/04/2015  . Allergic rhinitis 04/03/2013  . Clinical depression 04/03/2013  . History of colon cancer 01/31/2013   Past Medical History  Diagnosis Date  . Headache(784.0)   . Personal history of malignant neoplasm of large intestine   . Inflammatory polyps of colon with rectal bleeding ([place]) 2008  . Rectal bleeding 2008  .  Breast screening, unspecified   . Cancer [facility]) 2008     rt colectomy for a polyp with carcinoma in situ   . Allergy    Current Outpatient Prescriptions on File Prior to Visit  Medication Sig  . citalopram (CELEXA) 20 MG tablet Take 20 mg by mouth daily.   . fluticasone (FLONASE) 50 MCG/ACT nasal spray Place 2 sprays into both nostrils daily.   Marland Kitchen omeprazole (PRILOSEC) 20 MG capsule Take 20 mg by mouth daily.  . meclizine (ANTIVERT) 12.5 MG tablet Take 12.5 mg by mouth as needed.   . rizatriptan (MAXALT) 10 MG tablet Take 10 mg by mouth as needed for migraine. May repeat in 2 hours if needed   No current facility-administered medications on file prior to visit.   Allergies  Allergen Reactions  . Codeine     stomach pain   . Peanut-Containing Drug Products Nausea And Vomiting    Pine nut  . Phenergan [Promethazine Hcl] Other (See Comments)    tremors   Past Surgical History  Procedure Laterality Date  . Colonoscopy  2012    Dr. Jamal Collin  . Colectomy Right 2008  . Abdominal hysterectomy  1996  . Foot surgery Left 2012  . Foot surgery Left 2013  . Appendectomy     Social History   Social History  . Marital Status: Married    Spouse Name: Gershon Mussel  . Number of Children: 2  . Years of Education: N/A   Occupational History  . Retired    Social History Main  Topics  . Smoking status: Never Smoker   . Smokeless tobacco: Never Used  . Alcohol Use: 12.0 oz/week    0 Standard drinks or equivalent, 20 Cans of beer per week     Comment: beer  . Drug Use: No  . Sexual Activity: Not on file   Other Topics Concern  . Not on file   Social History Narrative   Family History  Problem Relation Age of Onset  . Heart disease Mother   . Heart disease Father   . Drug abuse Brother       Objective:   Physical Exam  Constitutional: She is oriented to person, place, and time. She appears well-developed and well-nourished.  HENT:  Head: Normocephalic and atraumatic.  Right Ear:  External ear normal.  Left Ear: External ear normal.  Mouth/Throat: Oropharynx is clear and moist.  Eyes: Conjunctivae and EOM are normal. Pupils are equal, round, and reactive to light.  Neck: Normal range of motion. Neck supple.  Increased fullness under right neck.   Cardiovascular: Normal rate and regular rhythm.   Pulmonary/Chest: Effort normal and breath sounds normal.  Neurological: She is alert and oriented to person, place, and time.  Psychiatric: She has a normal mood and affect. Her behavior is normal. Judgment and thought content normal.  BP 132/76 mmHg  Pulse 80  Temp(Src) 98.4 F (36.9 C) (Oral)  Resp 16  Ht 5\' 4"  (1.626 m)  Wt 155 lb (70.308 kg)  BMI 26.59 kg/m2  SpO2 97%      Assessment & Plan:  1. Pre-op exam Stable.  EKG and pulse ox is normal today.  Cleared for surgery.  - EKG 12-Lead  2. Clinical depression Condition is stable. Please continue current medication and  plan of care as noted.    - citalopram (CELEXA) 20 MG tablet; Take 1 tablet (20 mg total) by mouth daily.  Dispense: 90 tablet; Refill: 3  3. Gastroesophageal reflux disease without esophagitis Will refill medication.   - omeprazole (PRILOSEC) 20 MG capsule; Take 1 capsule (20 mg total) by mouth daily.  Dispense: 90 capsule; Refill: 3  4. Persistent thyroglossal duct cyst Scheduled for surgery.   Margarita Rana, MD

## 2015-08-04 ENCOUNTER — Other Ambulatory Visit: Payer: Self-pay | Admitting: Family Medicine

## 2015-08-04 DIAGNOSIS — J309 Allergic rhinitis, unspecified: Secondary | ICD-10-CM

## 2015-08-21 ENCOUNTER — Encounter
Admission: RE | Admit: 2015-08-21 | Discharge: 2015-08-21 | Disposition: A | Discharge status: Home / Self Care | Payer: Commercial Managed Care - HMO | Source: Ambulatory Visit | Attending: Otolaryngology | Admitting: Otolaryngology

## 2015-08-21 DIAGNOSIS — Z01818 Encounter for other preprocedural examination: Secondary | ICD-10-CM | POA: Diagnosis not present

## 2015-08-21 HISTORY — DX: Major depressive disorder, single episode, unspecified: F32.9

## 2015-08-21 HISTORY — DX: Gastro-esophageal reflux disease without esophagitis: K21.9

## 2015-08-21 HISTORY — DX: Depression, unspecified: F32.A

## 2015-08-21 HISTORY — DX: Anxiety disorder, unspecified: F41.9

## 2015-08-21 NOTE — Patient Instructions (Signed)
  Your procedure is scheduled on: September 04, 2015 (Thursday) Report to Day Surgery. To find out your arrival time please call [phone] between 1PM - 3PM on September 03, 2015 (Wednesday).  Remember: Instructions that are not followed completely may result in serious medical risk, up to and including death, or upon the discretion of your surgeon and anesthesiologist your surgery may need to be rescheduled.    __x__ 1. Do not eat food or drink liquids after midnight. No gum chewing or hard candies.     __x__ 2. No Alcohol for 24 hours before or after surgery.   ____ 3. Bring all medications with you on the day of surgery if instructed.    ___x_ 4. Notify your doctor if there is any change in your medical condition     (cold, fever, infections).     Do not wear jewelry, make-up, hairpins, clips or nail polish.  Do not wear lotions, powders, or perfumes. You may wear deodorant.  Do not shave 48 hours prior to surgery. Men may shave face and neck.  Do not bring valuables to the hospital.    [facility] is not responsible for any belongings or valuables.               Contacts, dentures or bridgework may not be worn into surgery.  Leave your suitcase in the car. After surgery it may be brought to your room.  For patients admitted to the hospital, discharge time is determined by your                treatment team.   Patients discharged the day of surgery will not be allowed to drive home.   Please read over the following fact sheets that you were given:      _x_ Take these medicines the morning of surgery with A SIP OF WATER:    1. Omeprazole (Omeprazole at bedtime on November 16)  2.   3.   4.  5.  6.  ____ Fleet Enema (as directed)   ____ Use CHG Soap as directed  ____ Use inhalers on the day of surgery  ____ Stop metformin 2 days prior to surgery    ____ Take 1/2 of usual insulin dose the night before surgery and none on the morning of surgery.   ____ Stop  Coumadin/Plavix/aspirin on   ____ Stop Anti-inflammatories on (Tylenol ok to take for pain if needed)   ____ Stop supplements until after surgery.    ____ Bring C-Pap to the hospital.

## 2015-09-04 ENCOUNTER — Encounter: Payer: Self-pay | Admitting: *Deleted

## 2015-09-04 ENCOUNTER — Ambulatory Visit: Payer: Commercial Managed Care - HMO | Admitting: Anesthesiology

## 2015-09-04 ENCOUNTER — Observation Stay
Admission: RE | Admit: 2015-09-04 | Discharge: 2015-09-04 | Disposition: A | Discharge status: Home / Self Care | Payer: Commercial Managed Care - HMO | Source: Ambulatory Visit | Attending: Otolaryngology | Admitting: Otolaryngology

## 2015-09-04 ENCOUNTER — Encounter
Admission: RE | Disposition: A | Discharge status: Home / Self Care | Payer: Self-pay | Source: Ambulatory Visit | Attending: Otolaryngology

## 2015-09-04 DIAGNOSIS — Z85038 Personal history of other malignant neoplasm of large intestine: Secondary | ICD-10-CM | POA: Diagnosis not present

## 2015-09-04 DIAGNOSIS — K219 Gastro-esophageal reflux disease without esophagitis: Secondary | ICD-10-CM | POA: Diagnosis not present

## 2015-09-04 DIAGNOSIS — R1310 Dysphagia, unspecified: Secondary | ICD-10-CM | POA: Diagnosis not present

## 2015-09-04 DIAGNOSIS — Z87798 Personal history of other (corrected) congenital malformations: Secondary | ICD-10-CM

## 2015-09-04 DIAGNOSIS — R221 Localized swelling, mass and lump, neck: Secondary | ICD-10-CM | POA: Diagnosis not present

## 2015-09-04 DIAGNOSIS — Q892 Congenital malformations of other endocrine glands: Principal | ICD-10-CM | POA: Insufficient documentation

## 2015-09-04 DIAGNOSIS — F419 Anxiety disorder, unspecified: Secondary | ICD-10-CM | POA: Diagnosis not present

## 2015-09-04 DIAGNOSIS — Z79899 Other long term (current) drug therapy: Secondary | ICD-10-CM | POA: Diagnosis not present

## 2015-09-04 DIAGNOSIS — G43909 Migraine, unspecified, not intractable, without status migrainosus: Secondary | ICD-10-CM | POA: Diagnosis not present

## 2015-09-04 DIAGNOSIS — Z885 Allergy status to narcotic agent status: Secondary | ICD-10-CM | POA: Insufficient documentation

## 2015-09-04 HISTORY — PX: THYROGLOSSAL DUCT CYST: SHX297

## 2015-09-04 SURGERY — EXCISION, THYROGLOSSAL DUCT CYST
Anesthesia: General | Wound class: Clean

## 2015-09-04 MED ORDER — FENTANYL CITRATE (PF) 100 MCG/2ML IJ SOLN: 25.0000 ug | INTRAMUSCULAR | Status: DC | PRN

## 2015-09-04 MED ORDER — BACITRACIN 500 UNIT/GM EX OINT: 1.0000 "application " | TOPICAL_OINTMENT | Freq: Three times a day (TID) | CUTANEOUS | Status: DC

## 2015-09-04 MED ORDER — DEXAMETHASONE SODIUM PHOSPHATE 4 MG/ML IJ SOLN
INTRAMUSCULAR | Status: DC | PRN
  Administered 2015-09-04: 8 mg via INTRAVENOUS

## 2015-09-04 MED ORDER — ROCURONIUM BROMIDE 100 MG/10ML IV SOLN
INTRAVENOUS | Status: DC | PRN
  Administered 2015-09-04 (×2): 5 mg via INTRAVENOUS
  Administered 2015-09-04: 10 mg via INTRAVENOUS
  Administered 2015-09-04: 15 mg via INTRAVENOUS

## 2015-09-04 MED ORDER — BACITRACIN 500 UNIT/GM EX OINT
1.0000 "application " | TOPICAL_OINTMENT | Freq: Three times a day (TID) | CUTANEOUS | Status: DC
  Administered 2015-09-04: 1 via TOPICAL
  Filled 2015-09-04 (×3): qty 0.9

## 2015-09-04 MED ORDER — FLUTICASONE PROPIONATE 50 MCG/ACT NA SUSP
2.0000 | Freq: Every day | NASAL | Status: DC
  Filled 2015-09-04: qty 16

## 2015-09-04 MED ORDER — TRAMADOL HCL 50 MG PO TABS: 50.0000 mg | ORAL_TABLET | Freq: Four times a day (QID) | ORAL | Status: DC | PRN

## 2015-09-04 MED ORDER — CITALOPRAM HYDROBROMIDE 20 MG PO TABS: 20.0000 mg | ORAL_TABLET | Freq: Every day | ORAL | Status: DC

## 2015-09-04 MED ORDER — LACTATED RINGERS IV SOLN
INTRAVENOUS | Status: DC
  Administered 2015-09-04 (×2): via INTRAVENOUS

## 2015-09-04 MED ORDER — SUGAMMADEX SODIUM 200 MG/2ML IV SOLN
INTRAVENOUS | Status: DC | PRN
  Administered 2015-09-04: 138.8 mg via INTRAVENOUS

## 2015-09-04 MED ORDER — BACITRACIN ZINC 500 UNIT/GM EX OINT
TOPICAL_OINTMENT | CUTANEOUS | Status: AC
  Filled 2015-09-04: qty 28.35

## 2015-09-04 MED ORDER — ONDANSETRON HCL 4 MG PO TABS: 4.0000 mg | ORAL_TABLET | ORAL | Status: DC | PRN

## 2015-09-04 MED ORDER — PROPOFOL 10 MG/ML IV BOLUS
INTRAVENOUS | Status: DC | PRN
  Administered 2015-09-04: 20 mg via INTRAVENOUS
  Administered 2015-09-04: 130 mg via INTRAVENOUS
  Administered 2015-09-04: 50 mg via INTRAVENOUS

## 2015-09-04 MED ORDER — FENTANYL CITRATE (PF) 100 MCG/2ML IJ SOLN
INTRAMUSCULAR | Status: DC | PRN
  Administered 2015-09-04: 50 ug via INTRAVENOUS
  Administered 2015-09-04: 25 ug via INTRAVENOUS
  Administered 2015-09-04: 50 ug via INTRAVENOUS
  Administered 2015-09-04: 100 ug via INTRAVENOUS
  Administered 2015-09-04: 50 ug via INTRAVENOUS

## 2015-09-04 MED ORDER — ONDANSETRON HCL 4 MG/2ML IJ SOLN: 4.0000 mg | INTRAMUSCULAR | Status: DC | PRN

## 2015-09-04 MED ORDER — ONDANSETRON HCL 4 MG/2ML IJ SOLN
INTRAMUSCULAR | Status: DC | PRN
  Administered 2015-09-04: 4 mg via INTRAVENOUS

## 2015-09-04 MED ORDER — SUCCINYLCHOLINE CHLORIDE 20 MG/ML IJ SOLN
INTRAMUSCULAR | Status: DC | PRN
  Administered 2015-09-04: 100 mg via INTRAVENOUS

## 2015-09-04 MED ORDER — PANTOPRAZOLE SODIUM 40 MG PO TBEC
40.0000 mg | DELAYED_RELEASE_TABLET | Freq: Every day | ORAL | Status: DC
  Administered 2015-09-04: 40 mg via ORAL
  Filled 2015-09-04: qty 1

## 2015-09-04 MED ORDER — OXYCODONE HCL 5 MG/5ML PO SOLN: 5.0000 mg | Freq: Once | ORAL | Status: DC | PRN

## 2015-09-04 MED ORDER — GLYCOPYRROLATE 0.2 MG/ML IJ SOLN
INTRAMUSCULAR | Status: DC | PRN
  Administered 2015-09-04: 0.2 mg via INTRAVENOUS

## 2015-09-04 MED ORDER — SODIUM CHLORIDE 0.45 % IV SOLN
INTRAVENOUS | Status: DC
  Administered 2015-09-04: 13:00:00 via INTRAVENOUS

## 2015-09-04 MED ORDER — TRAMADOL HCL 50 MG PO TABS: 50.0000 mg | ORAL_TABLET | Freq: Four times a day (QID) | ORAL | Status: DC

## 2015-09-04 MED ORDER — MIDAZOLAM HCL 2 MG/2ML IJ SOLN
INTRAMUSCULAR | Status: DC | PRN
  Administered 2015-09-04: 2 mg via INTRAVENOUS

## 2015-09-04 MED ORDER — MORPHINE SULFATE (PF) 2 MG/ML IV SOLN: 2.0000 mg | INTRAVENOUS | Status: DC | PRN

## 2015-09-04 MED ORDER — BACITRACIN ZINC 500 UNIT/GM EX OINT
TOPICAL_OINTMENT | CUTANEOUS | Status: DC | PRN
  Administered 2015-09-04: 1 via TOPICAL

## 2015-09-04 MED ORDER — CEPHALEXIN 500 MG PO CAPS
500.0000 mg | ORAL_CAPSULE | Freq: Two times a day (BID) | ORAL | Status: DC
  Administered 2015-09-04: 500 mg via ORAL
  Filled 2015-09-04: qty 1

## 2015-09-04 MED ORDER — CEPHALEXIN 500 MG PO CAPS: 500.0000 mg | ORAL_CAPSULE | Freq: Two times a day (BID) | ORAL | Status: AC

## 2015-09-04 MED ORDER — LIDOCAINE HCL (CARDIAC) 20 MG/ML IV SOLN
INTRAVENOUS | Status: DC | PRN
  Administered 2015-09-04: 80 mg via INTRAVENOUS

## 2015-09-04 MED ORDER — LIDOCAINE-EPINEPHRINE 1 %-1:100000 IJ SOLN
INTRAMUSCULAR | Status: DC | PRN
  Administered 2015-09-04: 7.5 mL

## 2015-09-04 MED ORDER — ACETAMINOPHEN 325 MG PO TABS: 650.0000 mg | ORAL_TABLET | ORAL | Status: DC | PRN

## 2015-09-04 MED ORDER — OXYCODONE HCL 5 MG PO TABS: 5.0000 mg | ORAL_TABLET | Freq: Once | ORAL | Status: DC | PRN

## 2015-09-04 SURGICAL SUPPLY — 43 items
APPLIER CLIP 11 MED OPEN (CLIP)
APPLIER CLIP 9.375 SM OPEN (CLIP)
BLADE SURG 15 STRL LF DISP TIS (BLADE) ×1 IMPLANT
BLADE SURG 15 STRL SS (BLADE) ×1
CANISTER SUCT 1200ML W/VALVE (MISCELLANEOUS) ×2 IMPLANT
CLIP APPLIE 11 MED OPEN (CLIP) IMPLANT
CLIP APPLIE 9.375 SM OPEN (CLIP) IMPLANT
CORD BIP STRL DISP 12FT (MISCELLANEOUS) ×2 IMPLANT
DRAIN TLS ROUND 10FR (DRAIN) IMPLANT
DRAPE MAG INST 16X20 L/F (DRAPES) ×2 IMPLANT
ELECT CAUTERY BLADE TIP 2.5 (TIP) ×2
ELECTRODE CAUTERY BLDE TIP 2.5 (TIP) ×1 IMPLANT
FORCEPS JEWEL BIP 4-3/4 STR (INSTRUMENTS) ×2 IMPLANT
GLOVE BIO SURGEON STRL SZ7.5 (GLOVE) ×4 IMPLANT
GOWN STRL REUS W/ TWL LRG LVL3 (GOWN DISPOSABLE) ×3 IMPLANT
GOWN STRL REUS W/TWL LRG LVL3 (GOWN DISPOSABLE) ×3
HARMONIC SCALPEL FOCUS (MISCELLANEOUS) ×2 IMPLANT
HEMOSTAT SURGICEL 2X3 (HEMOSTASIS) ×2 IMPLANT
HOOK STAY 5M SHARP BLUNT 3316- (MISCELLANEOUS) ×2 IMPLANT
KIT RM TURNOVER STRD PROC AR (KITS) ×2 IMPLANT
LIQUID BAND (GAUZE/BANDAGES/DRESSINGS) IMPLANT
NS IRRIG 500ML POUR BTL (IV SOLUTION) ×2 IMPLANT
PACK HEAD/NECK (MISCELLANEOUS) ×2 IMPLANT
PAD GROUND ADULT SPLIT (MISCELLANEOUS) ×2 IMPLANT
SPONGE KITTNER 5P (MISCELLANEOUS) ×8 IMPLANT
SPONGE XRAY 4X4 16PLY STRL (MISCELLANEOUS) ×2 IMPLANT
STRIP CLOSURE SKIN 1/4X4 (GAUZE/BANDAGES/DRESSINGS) IMPLANT
SUCTION FRAZIER TIP 10 FR DISP (SUCTIONS) ×2 IMPLANT
SUT ETHILON 3-0 FS-10 30 BLK (SUTURE)
SUT SILK 0 (SUTURE) ×1
SUT SILK 0 30XBRD TIE 6 (SUTURE) ×1 IMPLANT
SUT SILK 2 0 (SUTURE) ×1
SUT SILK 2 0 SH (SUTURE) IMPLANT
SUT SILK 2-0 30XBRD TIE 12 (SUTURE) ×1 IMPLANT
SUT SILK 3 0 REEL (SUTURE) IMPLANT
SUT SILK 4 0 (SUTURE)
SUT SILK 4-0 18XBRD TIE 12 (SUTURE) IMPLANT
SUT VIC AB 3-0 SH 27 (SUTURE)
SUT VIC AB 3-0 SH 27X BRD (SUTURE) IMPLANT
SUT VIC AB 4-0 RB1 18 (SUTURE) ×2 IMPLANT
SUTURE EHLN 3-0 FS-10 30 BLK (SUTURE) IMPLANT
SYR 3ML LL SCALE MARK (SYRINGE) ×2 IMPLANT
SYSTEM CHEST DRAIN TLS 7FR (DRAIN) IMPLANT

## 2015-09-04 NOTE — Final Progress Note (Signed)
..   09/04/2015 5:37 PM  Teofilo Pod [id number]  Post-Op Day 0    Temp:  [97.3 F (36.3 C)-98.7 F (37.1 C)] 98.2 F (36.8 C) (11/17 1100) Pulse Rate:  [68-82] 68 (11/17 1100) Resp:  [14-17] 14 (11/17 1100) BP: (123-158)/(74-86) 158/74 mmHg (11/17 1100) SpO2:  [98 %-99 %] 99 % (11/17 1100),     Intake/Output Summary (Last 24 hours) at 09/04/15 1737 Last data filed at 09/04/15 1600  Gross per 24 hour  Intake 1328.33 ml  Output   1050 ml  Net 278.33 ml    No results found for this or any previous visit (from the past 24 hour(s)).  SUBJECTIVE:  No acute events.  Patient's drain came out when ambulating.  Had some very mild bleeding through incision but doing well.  Tolerating PO.  Pain controlled with oral medications.  No breathing difficulty.  OBJECTIVE:   GEN- NAD, sitting upright in bed NECK- moderate bruising superficially but no hematoma or seroma.  No evidence of active bleeding.  IMPRESSION:  S/p Sistrunk procedure for thyroglossal duct cyst  PLAN:  OK to discharge home.  Given precautions for strenuous activity or bending over.  Patient will use Ice.  Follow up tomorrow at 1:00p.m.  Edilia Ghuman 09/04/2015, 5:37 PM

## 2015-09-04 NOTE — H&P (Signed)
..  History and Physical paper copy reviewed and updated date of procedure and will be scanned into system.  

## 2015-09-04 NOTE — Op Note (Signed)
....09/04/2015  9:27 AM    Teofilo Pod  [id number]   Pre-Op Dx: Neck mass, Thyroglossal duct cyst  Post-op Dx: SAME  Proc: Excision of Thyroglossal duct cyst via Sistrunk procedure  Surg: Carloyn Manner  Assistant: Malon Kindle  Anes: GOT  EBL: <10ccs  Comp: None  Indications:  Large cystic mass consistent with thyroglossal duct cyst with dysphagia and compressive symptoms  Findings: Large multilobulated cyst extending from midline neck overlying the thyroid cartilage extending rightward and to inferior portion of hyoid bone.  Cyst exended posterior to hyoid toward pre-epiglottic space as well.  All cyst as well as midportion of hyoid bone and cuff of tissue between base of tongue and hyoid bone excised.  Description of Procedure: After the patient was identified in hold and the history and physical and consent was reviewed and updated. The patient was marked in an upright position on the anterior neck along a natural occuring skin crease. The patient was next taken to the operating room and placed in a supine position. General endotracheal anesthesia was induced. The patient's anterior marked neck crease was neck injected with 7.5cc's of 1% Lidocaine with 1:100,000 Epinephrine. The patient was next prepped and draped in a sterile normal fashion.  At this time, a 15 blade scalpel was used to make a skin incision along a previously marked anterior neck crease. Dissection was carefully performed through the subcutaneous tissues with combination of Bovie electrocautery and blunt dissection.  The platysma was incised and anterior neck veins ligated with harmonic scalpel.  There was noted to be significant vasculature along the anterior border of the neck mass  The median raphe of the strap muscles was divided in a linear fashion with Bovie electrocautery until the anterior border of the cystic mass was identified.  Using a combination of blunt and sharp techniques the  left lateral border of the cystic mass was circumferentially dissected away from overlying muscle as well as the thyroid cartilage itself until the thyroid notch was identified.  At this time dissection was performed inferiorly as well as laterally on the patient's right side of the cystic mass.  There was an area were the cystic mass was multilobulated and extending laterally inferior to the hyoid bone.  A small rent was made in the cystic mass at this point.  Blunt dissection was continued inferior and posteriorly to the mass until the pre-epiglottic space was identified.  Superiorly, the hyoid bone was skeletonized at the midline and taken laterally until the lesser cornu.  Using bipolar cautery, the superior and inferior attachments to the hyoid bone were separated.  An alice clamp was attached to the mid-portion of the hyoid bone and using bone forceps the mid-portion of the hyoid bone was separated with the cystic mass still attached.  Fracturing of bone occurred at this time and multiple pieces were removed.  At this time the remaining attachments of the geniohyoid and mylohyoid to the hyoid were removed and attention was directed at separating the cystic mass from the pre-epiglottic space and mucosa.  Careful blunt dissection with Kitners and bipolar was used to avoid any mucosal disruption and the cyst in its entirety was removed.  The wound was copiously irrigated with sterile saline. Meticulous hemostasis with bipolar was obtained. Surgicel was placed in the wound bed bilaterally.  Visualization and palpation of the wound did not reveal any additional areas of concern.  A TLS drain was placed in the patient's left neck. The wound was then closed in a multilayered  fashion with vicryl for subcutaneous tissues and locking running 6.0 prolene suture for the skin closure.  This was topped with Bacitracin ointment.  At this time the patient was extubated and taken to PACU in good condition.  Plan:  Admit for observation.  Follow pathology.  Limit activity for 2 weeks. Follow up next week for post-operative evaluation and suture removal.  Monish Haliburton  09/04/2015 9:27 AM

## 2015-09-04 NOTE — Progress Notes (Signed)
09/04/2015 16:15  Rogue Bussing to be D/C'd Home per MD order.  Discussed prescriptions and follow up appointments with the patient. Prescriptions given to patient, medication list explained in detail. Pt verbalized understanding.    Medication List    TAKE these medications        bacitracin 500 UNIT/GM ointment  Apply 1 application topically every 8 (eight) hours.     cephALEXin 500 MG capsule  Commonly known as:  KEFLEX  Take 1 capsule (500 mg total) by mouth 2 (two) times daily.     citalopram 20 MG tablet  Commonly known as:  CELEXA  Take 1 tablet (20 mg total) by mouth daily.     fluticasone 50 MCG/ACT nasal spray  Commonly known as:  FLONASE  USE 2 SPRAYS IN EACH NOSTRIL EVERY DAY (SUBSTITUTED FOR FLONASE)     meclizine 12.5 MG tablet  Commonly known as:  ANTIVERT  Take 12.5 mg by mouth as needed.     omeprazole 20 MG capsule  Commonly known as:  PRILOSEC  Take 1 capsule (20 mg total) by mouth daily.     ondansetron 4 MG tablet  Commonly known as:  ZOFRAN  Take 1 tablet (4 mg total) by mouth every 4 (four) hours as needed for nausea.     rizatriptan 10 MG tablet  Commonly known as:  MAXALT  Take 10 mg by mouth as needed for migraine. May repeat in 2 hours if needed     traMADol 50 MG tablet  Commonly known as:  ULTRAM  Take 1 tablet (50 mg total) by mouth every 6 (six) hours as needed for moderate pain.        Filed Vitals:   09/04/15 1100  BP: 158/74  Pulse: 68  Temp: 98.2 F (36.8 C)  Resp: 14    Skin clean, dry and intact without evidence of skin break down, no evidence of skin tears noted. IV catheter discontinued intact. Site without signs and symptoms of complications. Dressing and pressure applied. Pt denies pain at this time. No complaints noted.  An After Visit Summary was printed and given to the patient. Patient escorted via [place], and D/C home via private auto.  [name]

## 2015-09-04 NOTE — Anesthesia Preprocedure Evaluation (Signed)
Anesthesia Evaluation  Patient identified by MRN, date of birth, ID band Patient awake    Reviewed: Allergy & Precautions, H&P , NPO status , Patient's Chart, lab work & pertinent test results  History of Anesthesia Complications Negative for: history of anesthetic complications  Airway Mallampati: II  TM Distance: >3 FB Neck ROM: full    Dental  (+) Poor Dentition, Chipped, Caps   Pulmonary neg pulmonary ROS, neg shortness of breath,    Pulmonary exam normal breath sounds clear to auscultation       Cardiovascular Exercise Tolerance: Good (-) angina(-) Past MI and (-) DOE negative cardio ROS Normal cardiovascular exam Rhythm:regular Rate:Normal     Neuro/Psych  Headaches, PSYCHIATRIC DISORDERS Anxiety Depression    GI/Hepatic Neg liver ROS, GERD  Controlled,  Endo/Other  negative endocrine ROS  Renal/GU negative Renal ROS  negative genitourinary   Musculoskeletal   Abdominal   Peds  Hematology negative hematology ROS (+)   Anesthesia Other Findings Past Medical History:   Headache(784.0)                                              Personal history of malignant neoplasm of larg*              Inflammatory polyps of colon with rectal bleed* 2008         Rectal bleeding                                 2008         Breast screening, unspecified                                Cancer ([place])                                    2008           Comment: rt colectomy for a polyp with carcinoma in               situ    Allergy                                                      Depression                                                   Anxiety                                                      GERD (gastroesophageal reflux disease)                      Past Surgical History:   COLONOSCOPY  2012           Comment:Dr. Jamal Collin   COLECTOMY                                       Right  2008         ABDOMINAL HYSTERECTOMY                           1996         FOOT SURGERY                                    Left 2012         FOOT SURGERY                                    Left 2013         APPENDECTOMY                                                    Reproductive/Obstetrics negative OB ROS                             Anesthesia Physical Anesthesia Plan  ASA: III  Anesthesia Plan: General ETT   Post-op Pain Management:    Induction:   Airway Management Planned:   Additional Equipment:   Intra-op Plan:   Post-operative Plan:   Informed Consent: I have reviewed the patients History and Physical, chart, labs and discussed the procedure including the risks, benefits and alternatives for the proposed anesthesia with the patient or authorized representative who has indicated his/her understanding and acceptance.   Dental Advisory Given  Plan Discussed with: Anesthesiologist, CRNA and Surgeon  Anesthesia Plan Comments:         Anesthesia Quick Evaluation

## 2015-09-04 NOTE — Progress Notes (Signed)
09/04/2015 16:15  Pt notified me that her closed system drain had come loose from its securement and the tube had come completely out of her neck.  No bleeding noted, and scant if any drainage was in the tube at the time.  Notified Dr Pryor Ochoa who recommended to watch the site for signs of hematoma.  Will continue to monitor and assess. [name], RN

## 2015-09-04 NOTE — Progress Notes (Signed)
09/04/2015 2:08 PM  Noted bleeding from incision and tube insertion site.  Reinforced tegaderm with gauze and spoke to MD.  Dr Pryor Ochoa recommended PRN dressing changes and ice packs.  Will continue to monitor and assess. [name], RN

## 2015-09-04 NOTE — Anesthesia Postprocedure Evaluation (Signed)
  Anesthesia Post-op Note  Patient: Lisa Caldwell  Procedure(s) Performed: Procedure(s): THYROGLOSSAL DUCT CYST (N/A)  Anesthesia type:General ETT  Patient location: PACU  Post pain: Pain level controlled  Post assessment: Post-op Vital signs reviewed, Patient's Cardiovascular Status Stable, Respiratory Function Stable, Patent Airway and No signs of Nausea or vomiting  Post vital signs: Reviewed and stable  Last Vitals:  Filed Vitals:   09/04/15 1100  BP: 158/74  Pulse: 68  Temp: 36.8 C  Resp: 14    Level of consciousness: awake, alert  and patient cooperative  Complications: No apparent anesthesia complications

## 2015-09-04 NOTE — Transfer of Care (Signed)
Immediate Anesthesia Transfer of Care Note  Patient: Lisa Caldwell  Procedure(s) Performed: Procedure(s): THYROGLOSSAL DUCT CYST (N/A)  Patient Location: PACU  Anesthesia Type:General  Level of Consciousness: awake, alert  and oriented  Airway & Oxygen Therapy: Patient Spontanous Breathing and Patient connected to face mask oxygen  Post-op Assessment: Report given to RN and Post -op Vital signs reviewed and stable  Post vital signs: Reviewed and stable  Last Vitals:  Filed Vitals:   09/04/15 0937  BP:   Pulse:   Temp: 36.4 C  Resp:     Complications: No apparent anesthesia complications

## 2015-09-05 ENCOUNTER — Encounter: Payer: Self-pay | Admitting: Otolaryngology

## 2015-09-05 LAB — SURGICAL PATHOLOGY

## 2015-12-11 ENCOUNTER — Other Ambulatory Visit: Payer: Self-pay | Admitting: *Deleted

## 2015-12-11 DIAGNOSIS — Z1231 Encounter for screening mammogram for malignant neoplasm of breast: Secondary | ICD-10-CM

## 2015-12-29 ENCOUNTER — Encounter: Payer: Self-pay | Admitting: *Deleted

## 2016-02-04 ENCOUNTER — Ambulatory Visit
Admission: RE | Admit: 2016-02-04 | Discharge: 2016-02-04 | Disposition: A | Discharge status: Home / Self Care | Payer: Commercial Managed Care - HMO | Source: Ambulatory Visit | Attending: General Surgery | Admitting: General Surgery

## 2016-02-04 ENCOUNTER — Other Ambulatory Visit: Payer: Self-pay | Admitting: General Surgery

## 2016-02-04 DIAGNOSIS — Z1231 Encounter for screening mammogram for malignant neoplasm of breast: Secondary | ICD-10-CM

## 2016-02-10 ENCOUNTER — Other Ambulatory Visit: Payer: Self-pay | Admitting: General Surgery

## 2016-02-10 ENCOUNTER — Ambulatory Visit
Admission: RE | Admit: 2016-02-10 | Discharge: 2016-02-10 | Disposition: A | Discharge status: Home / Self Care | Payer: Commercial Managed Care - HMO | Source: Ambulatory Visit | Attending: General Surgery | Admitting: General Surgery

## 2016-02-10 DIAGNOSIS — Z1231 Encounter for screening mammogram for malignant neoplasm of breast: Secondary | ICD-10-CM | POA: Insufficient documentation

## 2016-02-18 ENCOUNTER — Telehealth: Payer: Self-pay | Admitting: *Deleted

## 2016-02-18 ENCOUNTER — Ambulatory Visit (INDEPENDENT_AMBULATORY_CARE_PROVIDER_SITE_OTHER): Payer: Commercial Managed Care - HMO | Admitting: General Surgery

## 2016-02-18 ENCOUNTER — Encounter: Payer: Self-pay | Admitting: General Surgery

## 2016-02-18 DIAGNOSIS — Z1239 Encounter for other screening for malignant neoplasm of breast: Secondary | ICD-10-CM | POA: Diagnosis not present

## 2016-02-18 DIAGNOSIS — Z85038 Personal history of other malignant neoplasm of large intestine: Secondary | ICD-10-CM

## 2016-02-18 MED ORDER — POLYETHYLENE GLYCOL 3350 17 GM/SCOOP PO POWD: ORAL | Status: DC

## 2016-02-18 NOTE — Progress Notes (Signed)
Patient ID: Lisa Caldwell, female   DOB: 04-18-1946, 70 y.o.   MRN: [id number]  Chief Complaint  Patient presents with  . Follow-up    mammogram  . Colonoscopy    HPI Lisa Caldwell is a 70 y.o. female.  who presents for a breast evaluation and colonoscopy discussion. The most recent mammogram was done on 02-10-16 .  Patient does perform regular self breast checks and gets regular mammograms done.   No new breast issues. Denies any gastrointestinal issues. Bowels are regular and daily, no bleeding.  Since last visit for an unusual neck mass, she under went ENT evaluation and subsequent resection of a thyroglossal duct cyst.  I have reviewed the history of present illness with the patient.  HPI  Past Medical History  Diagnosis Date  . Headache(784.0)   . Personal history of malignant neoplasm of large intestine   . Inflammatory polyps of colon with rectal bleeding ([place]) 2008     rt colectomy for a polyp with carcinoma in situ   . Allergy   . Depression   . Anxiety   . GERD (gastroesophageal reflux disease)     Past Surgical History  Procedure Laterality Date  . Colonoscopy  2012    Dr. Jamal Collin  . Colectomy Right 2008  . Abdominal hysterectomy  1996  . Foot surgery Left 2012  . Foot surgery Left 2013  . Appendectomy    . Thyroglossal duct cyst N/A 09/04/2015    Procedure: THYROGLOSSAL DUCT CYST;  Surgeon: Carloyn Manner, MD;  Location: [facility] ORS;  Service: ENT;  Laterality: N/A;    Family History  Problem Relation Age of Onset  . Heart disease Mother   . Heart disease Father   . Drug abuse Brother   . Breast cancer Neg Hx     Social History Social History  Substance Use Topics  . Smoking status: Never Smoker   . Smokeless tobacco: Never Used  . Alcohol Use: 6.0 oz/week    0 Standard drinks or equivalent, 10 Cans of beer per week     Comment: beer    Allergies  Allergen Reactions  . Codeine Nausea And Vomiting  . Peanut-Containing Drug Products Diarrhea  and Nausea And Vomiting    Pine nut  . Phenergan [Promethazine Hcl] Other (See Comments)    tremors    Current Outpatient Prescriptions  Medication Sig Dispense Refill  . citalopram (CELEXA) 20 MG tablet Take 1 tablet (20 mg total) by mouth daily. (Patient taking differently: Take 20 mg by mouth at bedtime. ) 90 tablet 3  . fluticasone (FLONASE) 50 MCG/ACT nasal spray USE 2 SPRAYS IN EACH NOSTRIL EVERY DAY (SUBSTITUTED FOR FLONASE) 48 g 3  . meclizine (ANTIVERT) 12.5 MG tablet Take 12.5 mg by mouth as needed.     Marland Kitchen omeprazole (PRILOSEC) 20 MG capsule Take 1 capsule (20 mg total) by mouth daily. 90 capsule 3  . ondansetron (ZOFRAN) 4 MG tablet Take 1 tablet (4 mg total) by mouth every 4 (four) hours as needed for nausea. 20 tablet 0  . rizatriptan (MAXALT) 10 MG tablet Take 10 mg by mouth as needed for migraine. May repeat in 2 hours if needed    . polyethylene glycol powder (GLYCOLAX/MIRALAX) powder 255 grams one bottle for colonoscopy prep 255 g 0   No current facility-administered medications for this visit.    Review of Systems Review of Systems  Constitutional: Negative.   Respiratory: Negative.   Cardiovascular: Negative.   Gastrointestinal: Negative  for diarrhea, constipation and blood in stool.    Blood pressure 120/78, pulse 58, resp. rate 12, height 5\' 4"  (1.626 m), weight 153 lb (69.4 kg).  Physical Exam Physical Exam  Constitutional: She is oriented to person, place, and time. She appears well-developed and well-nourished.  Eyes: Conjunctivae are normal. No scleral icterus.  Neck: Neck supple. No thyromegaly present.  Cardiovascular: Normal rate, regular rhythm and normal heart sounds.   Pulmonary/Chest: Effort normal and breath sounds normal.  Abdominal: Soft. Bowel sounds are normal.  Lymphadenopathy:    She has no cervical adenopathy.    She has no axillary adenopathy.  Neurological: She is alert and oriented to person, place, and time.  Skin: Skin is warm and  dry.    Data Reviewed Mammogram reviewed.  Assessment    Stable exam. History of right colon polyp with in situ cancer. S/P right colectomy 2008. Due for surveillance colonoscopy this year      Plan    Patient will be asked to return to the office in one year with a bilateral screening mammogram.  Colonoscopy with possible biopsy/polypectomy prn: Information regarding the procedure, including its potential risks and complications (including but not limited to perforation of the bowel, which may require emergency surgery to repair, and bleeding) was verbally given to the patient. Educational information regarding lower intestinal endoscopy was given to the patient. Written instructions for how to complete the bowel prep using Miralax were provided. The importance of drinking ample fluids to avoid dehydration as a result of the prep emphasized.      Patient has been scheduled for a colonoscopy on 03-10-16 at [facility]   PCP:  Margarita Rana This information has been scribed by Verlene Mayer, CMA.     Tashyra Adduci G 02/18/2016, 2:18 PM

## 2016-02-18 NOTE — Patient Instructions (Addendum)
The patient is aware to call back for any questions or concerns. Colonoscopy A colonoscopy is an exam to look at the entire large intestine (colon). This exam can help find problems such as tumors, polyps, inflammation, and areas of bleeding. The exam takes about 1 hour.  LET [facility] CARE PROVIDER KNOW ABOUT:   Any allergies you have.  All medicines you are taking, including vitamins, herbs, eye drops, creams, and over-the-counter medicines.  Previous problems you or members of your family have had with the use of anesthetics.  Any blood disorders you have.  Previous surgeries you have had.  Medical conditions you have. RISKS AND COMPLICATIONS  Generally, this is a safe procedure. However, as with any procedure, complications can occur. Possible complications include:  Bleeding.  Tearing or rupture of the colon wall.  Reaction to medicines given during the exam.  Infection (rare). BEFORE THE PROCEDURE   Ask your health care provider about changing or stopping your regular medicines.  You may be prescribed an oral bowel prep. This involves drinking a large amount of medicated liquid, starting the day before your procedure. The liquid will cause you to have multiple loose stools until your stool is almost clear or light green. This cleans out your colon in preparation for the procedure.  Do not eat or drink anything else once you have started the bowel prep, unless your health care provider tells you it is safe to do so.  Arrange for someone to drive you home after the procedure. PROCEDURE   You will be given medicine to help you relax (sedative).  You will lie on your side with your knees bent.  A long, flexible tube with a light and camera on the end (colonoscope) will be inserted through the rectum and into the colon. The camera sends video back to a computer screen as it moves through the colon. The colonoscope also releases carbon dioxide gas to inflate the colon. This  helps your health care provider see the area better.  During the exam, your health care provider may take a small tissue sample (biopsy) to be examined under a microscope if any abnormalities are found.  The exam is finished when the entire colon has been viewed. AFTER THE PROCEDURE   Do not drive for 24 hours after the exam.  You may have a small amount of blood in your stool.  You may pass moderate amounts of gas and have mild abdominal cramping or bloating. This is caused by the gas used to inflate your colon during the exam.  Ask when your test results will be ready and how you will get your results. Make sure you get your test results.   This information is not intended to replace advice given to you by your health care provider. Make sure you discuss any questions you have with your health care provider.   Document Released: 10/01/2000 Document Revised: 07/25/2013 Document Reviewed: 06/11/2013 Elsevier Interactive Patient Education [employer].  Patient has been scheduled for a colonoscopy on 03-10-16 at [facility].

## 2016-02-18 NOTE — Telephone Encounter (Signed)
Patient was just seen here in the office today 02/18/16 and has decided she does want you to give [facility] a call and get her set up for an appointment. She does prefer to see a female doctor.

## 2016-02-18 NOTE — Addendum Note (Signed)
Addended by: [name] on: 02/18/2016 03:12 PM   Modules accepted: Orders

## 2016-03-09 ENCOUNTER — Encounter: Payer: Self-pay | Admitting: *Deleted

## 2016-03-09 ENCOUNTER — Ambulatory Visit (INDEPENDENT_AMBULATORY_CARE_PROVIDER_SITE_OTHER): Payer: Commercial Managed Care - HMO | Admitting: Family Medicine

## 2016-03-09 ENCOUNTER — Encounter: Payer: Self-pay | Admitting: Family Medicine

## 2016-03-09 VITALS — BP 138/70 | HR 68 | Temp 98.5°F | Resp 16 | Wt 157.0 lb

## 2016-03-09 DIAGNOSIS — J309 Allergic rhinitis, unspecified: Secondary | ICD-10-CM

## 2016-03-09 DIAGNOSIS — M8580 Other specified disorders of bone density and structure, unspecified site: Secondary | ICD-10-CM

## 2016-03-09 DIAGNOSIS — F32A Depression, unspecified: Secondary | ICD-10-CM

## 2016-03-09 DIAGNOSIS — F329 Major depressive disorder, single episode, unspecified: Secondary | ICD-10-CM | POA: Diagnosis not present

## 2016-03-09 DIAGNOSIS — E875 Hyperkalemia: Secondary | ICD-10-CM

## 2016-03-09 DIAGNOSIS — F4322 Adjustment disorder with anxiety: Secondary | ICD-10-CM | POA: Diagnosis not present

## 2016-03-09 NOTE — Progress Notes (Signed)
Patient ID: Lisa Caldwell, female   DOB: 01/19/1946, 70 y.o.   MRN: [id number]         Patient: Lisa Caldwell, Female    DOB: 06-24-1946, 70 y.o.   MRN: [id number] Visit Date: 03/09/2016  Today's Provider: Margarita Rana, MD   Chief Complaint  Patient presents with  . Depression    Depression        This is a chronic problem.The problem is unchanged (Pt reports doing well on current medications.).  Associated symptoms include no decreased concentration, no fatigue, no helplessness, no hopelessness, does not have insomnia, not irritable, no restlessness, no decreased interest, no appetite change, no body aches, no myalgias, no headaches, no indigestion, not sad and no suicidal ideas.  Past treatments include SSRIs - Selective serotonin reuptake inhibitors.  Compliance with treatment is good.  Previous treatment provided significant relief.   Does need a recheck of her labs. Feeling well.  Not due for her Wellness until July.  Has not had a migraine in quite awhile. Has not needed Maxalt or the Zofran.   ROS as below.      Review of Systems  Constitutional: Negative.  Negative for appetite change and fatigue.  HENT: Negative.   Eyes: Negative.   Respiratory: Negative.   Cardiovascular: Negative.   Endocrine: Negative.   Genitourinary: Negative.   Musculoskeletal: Negative.  Negative for myalgias.  Skin: Negative.   Allergic/Immunologic: Negative.   Neurological: Negative.  Negative for headaches.  Hematological: Negative.   Psychiatric/Behavioral: Positive for depression. Negative for suicidal ideas and decreased concentration. The patient does not have insomnia.     Social History   Social History  . Marital Status: Married    Spouse Name: Gershon Mussel  . Number of Children: 2  . Years of Education: N/A   Occupational History  . Retired    Social History Main Topics  . Smoking status: Never Smoker   . Smokeless tobacco: Never Used  . Alcohol Use: 6.0 oz/week    10 Cans of  beer, 0 Standard drinks or equivalent per week     Comment: beer  . Drug Use: No  . Sexual Activity: No     Comment: hysterectomy   Other Topics Concern  . Not on file   Social History Narrative    Past Medical History  Diagnosis Date  . Headache(784.0)   . Personal history of malignant neoplasm of large intestine   . Inflammatory polyps of colon with rectal bleeding ([place]) 2008     rt colectomy for a polyp with carcinoma in situ   . Allergy   . Depression   . Anxiety   . GERD (gastroesophageal reflux disease)   . Cancer [facility])     colon cancer     Patient Active Problem List   Diagnosis Date Noted  . History of thyroglossal duct cyst removal 09/04/2015  . Persistent thyroglossal duct cyst 07/25/2015  . Adaptation reaction 03/04/2015  . Screening for diabetes mellitus 03/04/2015  . Colon, diverticulosis 03/04/2015  . Elevated blood sugar 03/04/2015  . Acid reflux 03/04/2015  . High potassium 03/04/2015  . Hammer toe 03/04/2015  . Headache, migraine 03/04/2015  . Allergic rhinitis 04/03/2013  . Clinical depression 04/03/2013  . History of colon cancer 01/31/2013    Past Surgical History  Procedure Laterality Date  . Colonoscopy  2012    Dr. Jamal Collin  . Colectomy Right 2008  . Abdominal hysterectomy  1996  . Foot surgery Left 2012  .  Foot surgery Left 2013  . Appendectomy    . Thyroglossal duct cyst N/A 09/04/2015    Procedure: THYROGLOSSAL DUCT CYST;  Surgeon: Carloyn Manner, MD;  Location: [facility] ORS;  Service: ENT;  Laterality: N/A;    Her family history includes Drug abuse in her brother; Heart disease in her father and mother. There is no history of Breast cancer.    Previous Medications   CITALOPRAM (CELEXA) 20 MG TABLET    Take 1 tablet (20 mg total) by mouth daily.   FLUTICASONE (FLONASE) 50 MCG/ACT NASAL SPRAY    USE 2 SPRAYS IN EACH NOSTRIL EVERY DAY (SUBSTITUTED FOR FLONASE)   MECLIZINE (ANTIVERT) 12.5 MG TABLET    Take 12.5 mg by mouth as needed.     OMEPRAZOLE (PRILOSEC) 20 MG CAPSULE    Take 1 capsule (20 mg total) by mouth daily.   ONDANSETRON (ZOFRAN) 4 MG TABLET    Take 1 tablet (4 mg total) by mouth every 4 (four) hours as needed for nausea.   POLYETHYLENE GLYCOL POWDER (GLYCOLAX/MIRALAX) POWDER    255 grams one bottle for colonoscopy prep   RIZATRIPTAN (MAXALT) 10 MG TABLET    Take 10 mg by mouth as needed for migraine. May repeat in 2 hours if needed    Patient Care Team: Margarita Rana, MD as PCP - General (Family Medicine) Seeplaputhur Robinette Haines, MD (General Surgery) Margarita Rana, MD as Referring Physician (Family Medicine)     Objective:   Vitals: BP 138/70 mmHg  Pulse 68  Temp(Src) 98.5 F (36.9 C) (Oral)  Resp 16  Wt 157 lb (71.215 kg)  Physical Exam  Constitutional: She is oriented to person, place, and time. She appears well-developed and well-nourished. She is not irritable.  Cardiovascular: Normal rate, regular rhythm and normal heart sounds.   Pulmonary/Chest: Effort normal and breath sounds normal.  Neurological: She is alert and oriented to person, place, and time.  Skin: Skin is warm and dry.  Psychiatric: She has a normal mood and affect. Her behavior is normal. Judgment and thought content normal.    Assessment and Plan:   1. Clinical depression Stable; continue current medication.  Has not had a migraine recently.    - TSH  2. Osteopenia Will obtain Bone Density; and check Vitamin D.  - DG Bone Density - VITAMIN D 25 Hydroxy (Vit-D Deficiency, Fractures)  3. Allergic rhinitis, unspecified allergic rhinitis type Stable. Check labs.  - CBC with Differential/Platelet  4. Adjustment disorder with anxious mood Stable;  Continue medication. Check labs.    - TSH  5. High potassium Recheck labs.   - Comprehensive metabolic panel  Patient was seen and examined by Jerrell Belfast, MD, and note scribed by Ashley Royalty, CMA.   I have reviewed the document for accuracy and completeness and I agree  with above. Jerrell Belfast, MD   Margarita Rana, MD

## 2016-03-10 ENCOUNTER — Encounter: Payer: Self-pay | Admitting: Anesthesiology

## 2016-03-10 ENCOUNTER — Ambulatory Visit: Payer: Commercial Managed Care - HMO | Admitting: Registered Nurse

## 2016-03-10 ENCOUNTER — Ambulatory Visit
Admission: RE | Admit: 2016-03-10 | Discharge: 2016-03-10 | Disposition: A | Discharge status: Home / Self Care | Payer: Commercial Managed Care - HMO | Source: Ambulatory Visit | Attending: General Surgery | Admitting: General Surgery

## 2016-03-10 ENCOUNTER — Encounter
Admission: RE | Disposition: A | Discharge status: Home / Self Care | Payer: Self-pay | Source: Ambulatory Visit | Attending: General Surgery

## 2016-03-10 ENCOUNTER — Telehealth: Payer: Self-pay

## 2016-03-10 DIAGNOSIS — K219 Gastro-esophageal reflux disease without esophagitis: Secondary | ICD-10-CM | POA: Insufficient documentation

## 2016-03-10 DIAGNOSIS — K5730 Diverticulosis of large intestine without perforation or abscess without bleeding: Secondary | ICD-10-CM | POA: Diagnosis not present

## 2016-03-10 DIAGNOSIS — Z7951 Long term (current) use of inhaled steroids: Secondary | ICD-10-CM | POA: Insufficient documentation

## 2016-03-10 DIAGNOSIS — Z980 Intestinal bypass and anastomosis status: Secondary | ICD-10-CM | POA: Diagnosis not present

## 2016-03-10 DIAGNOSIS — Z9049 Acquired absence of other specified parts of digestive tract: Secondary | ICD-10-CM | POA: Diagnosis not present

## 2016-03-10 DIAGNOSIS — F329 Major depressive disorder, single episode, unspecified: Secondary | ICD-10-CM | POA: Insufficient documentation

## 2016-03-10 DIAGNOSIS — Z85038 Personal history of other malignant neoplasm of large intestine: Secondary | ICD-10-CM | POA: Diagnosis not present

## 2016-03-10 DIAGNOSIS — K5790 Diverticulosis of intestine, part unspecified, without perforation or abscess without bleeding: Secondary | ICD-10-CM | POA: Diagnosis not present

## 2016-03-10 DIAGNOSIS — Z1211 Encounter for screening for malignant neoplasm of colon: Secondary | ICD-10-CM | POA: Insufficient documentation

## 2016-03-10 DIAGNOSIS — Z79899 Other long term (current) drug therapy: Secondary | ICD-10-CM | POA: Diagnosis not present

## 2016-03-10 HISTORY — PX: COLONOSCOPY WITH PROPOFOL: SHX5780

## 2016-03-10 LAB — CBC WITH DIFFERENTIAL/PLATELET
BASOS ABS: 0.1 10*3/uL (ref 0.0–0.2)
Basos: 1 %
EOS (ABSOLUTE): 0.2 10*3/uL (ref 0.0–0.4)
Eos: 2 %
Hematocrit: 41.3 % (ref 34.0–46.6)
Hemoglobin: 13.7 g/dL (ref 11.1–15.9)
IMMATURE GRANS (ABS): 0 10*3/uL (ref 0.0–0.1)
Immature Granulocytes: 0 %
LYMPHS: 26 %
Lymphocytes Absolute: 1.7 10*3/uL (ref 0.7–3.1)
MCH: 32.8 pg (ref 26.6–33.0)
MCHC: 33.2 g/dL (ref 31.5–35.7)
MCV: 99 fL — ABNORMAL HIGH (ref 79–97)
MONOS ABS: 0.4 10*3/uL (ref 0.1–0.9)
Monocytes: 6 %
NEUTROS ABS: 4.3 10*3/uL (ref 1.4–7.0)
Neutrophils: 65 %
PLATELETS: 278 10*3/uL (ref 150–379)
RBC: 4.18 x10E6/uL (ref 3.77–5.28)
RDW: 13.3 % (ref 12.3–15.4)
WBC: 6.7 10*3/uL (ref 3.4–10.8)

## 2016-03-10 LAB — COMPREHENSIVE METABOLIC PANEL
A/G RATIO: 1.5 (ref 1.2–2.2)
ALK PHOS: 73 IU/L (ref 39–117)
ALT: 25 IU/L (ref 0–32)
AST: 25 IU/L (ref 0–40)
Albumin: 4.2 g/dL (ref 3.5–4.8)
BILIRUBIN TOTAL: 0.3 mg/dL (ref 0.0–1.2)
BUN/Creatinine Ratio: 15 (ref 12–28)
BUN: 11 mg/dL (ref 8–27)
CHLORIDE: 105 mmol/L (ref 96–106)
CO2: 25 mmol/L (ref 18–29)
Calcium: 9.4 mg/dL (ref 8.7–10.3)
Creatinine, Ser: 0.73 mg/dL (ref 0.57–1.00)
GFR calc Af Amer: 96 mL/min/{1.73_m2} (ref >59)
GFR calc non Af Amer: 84 mL/min/{1.73_m2} (ref >59)
GLUCOSE: 103 mg/dL — AB (ref 65–99)
Globulin, Total: 2.8 g/dL (ref 1.5–4.5)
POTASSIUM: 5.2 mmol/L (ref 3.5–5.2)
Sodium: 144 mmol/L (ref 134–144)
Total Protein: 7 g/dL (ref 6.0–8.5)

## 2016-03-10 LAB — TSH: TSH: 1.66 u[IU]/mL (ref 0.450–4.500)

## 2016-03-10 LAB — VITAMIN D 25 HYDROXY (VIT D DEFICIENCY, FRACTURES): VIT D 25 HYDROXY: 20.7 ng/mL — AB (ref 30.0–100.0)

## 2016-03-10 SURGERY — COLONOSCOPY WITH PROPOFOL
Anesthesia: General

## 2016-03-10 MED ORDER — PROPOFOL 500 MG/50ML IV EMUL
INTRAVENOUS | Status: DC | PRN
  Administered 2016-03-10: 180 ug/kg/min via INTRAVENOUS

## 2016-03-10 MED ORDER — FENTANYL CITRATE (PF) 100 MCG/2ML IJ SOLN
INTRAMUSCULAR | Status: DC | PRN
  Administered 2016-03-10: 50 ug via INTRAVENOUS

## 2016-03-10 MED ORDER — PROPOFOL 10 MG/ML IV BOLUS
INTRAVENOUS | Status: DC | PRN
  Administered 2016-03-10: 50 mg via INTRAVENOUS

## 2016-03-10 MED ORDER — SODIUM CHLORIDE 0.9 % IV SOLN
INTRAVENOUS | Status: DC
Start: 2016-03-10 — End: 2016-03-10
  Administered 2016-03-10: 08:00:00 via INTRAVENOUS

## 2016-03-10 MED ORDER — MIDAZOLAM HCL 2 MG/2ML IJ SOLN
INTRAMUSCULAR | Status: DC | PRN
  Administered 2016-03-10: 1 mg via INTRAVENOUS

## 2016-03-10 MED ORDER — LIDOCAINE HCL (CARDIAC) 20 MG/ML IV SOLN
INTRAVENOUS | Status: DC | PRN
  Administered 2016-03-10: 40 mg via INTRAVENOUS

## 2016-03-10 NOTE — Anesthesia Postprocedure Evaluation (Signed)
Anesthesia Post Note  Patient: Lisa Caldwell  Procedure(s) Performed: Procedure(s) (LRB): COLONOSCOPY WITH PROPOFOL (N/A)  Patient location during evaluation: Endoscopy Anesthesia Type: General Level of consciousness: awake and alert Pain management: pain level controlled Vital Signs Assessment: post-procedure vital signs reviewed and stable Respiratory status: spontaneous breathing, nonlabored ventilation, respiratory function stable and patient connected to nasal cannula oxygen Cardiovascular status: blood pressure returned to baseline and stable Postop Assessment: no signs of nausea or vomiting Anesthetic complications: no    Last Vitals:  Filed Vitals:   03/10/16 0924 03/10/16 0934  BP: 135/92 123/70  Pulse: 56 57  Temp:    Resp: 20 25    Last Pain: There were no vitals filed for this visit.               Zylee Marchiano S

## 2016-03-10 NOTE — Telephone Encounter (Signed)
Left message to call back  

## 2016-03-10 NOTE — Telephone Encounter (Signed)
Advised patient of results.  

## 2016-03-10 NOTE — Anesthesia Procedure Notes (Signed)
Date/Time: 03/10/2016 8:31 AM Performed by: Doreen Salvage Pre-anesthesia Checklist: Patient identified, Emergency Drugs available, Suction available and Patient being monitored Patient Re-evaluated:Patient Re-evaluated prior to inductionOxygen Delivery Method: Nasal cannula Intubation Type: IV induction Dental Injury: Teeth and Oropharynx as per pre-operative assessment  Comments: Nasal cannula with etCO2 monitoring

## 2016-03-10 NOTE — Op Note (Signed)
[facility] Gastroenterology Patient Name: Lisa Caldwell Procedure Date: 03/10/2016 8:31 AM MRN: [id number] Account #: [id number] Date of Birth: June 13, 1946 Admit Type: Outpatient Age: 70 Room: [facility] ENDO ROOM 1 Gender: Female Note Status: Finalized Procedure:            Colonoscopy Indications:          High risk colon cancer surveillance: Personal history                        of colon cancer Providers:            Kiffany Schelling G. Jamal Collin, MD Referring MD:         Jerrell Belfast, MD (Referring MD) Medicines:            General Anesthesia Complications:        No immediate complications. Procedure:            Pre-Anesthesia Assessment:                       - General anesthesia under the supervision of an                        anesthesiologist was determined to be medically                        necessary for this procedure based on review of the                        patient's medical history, medications, and prior                        anesthesia history.                       After obtaining informed consent, the colonoscope was                        passed under direct vision. Throughout the procedure,                        the patient's blood pressure, pulse, and oxygen                        saturations were monitored continuously. The                        Colonoscope was introduced through the anus and                        advanced to the the ileocolonic anastomosis. The                        colonoscopy was performed without difficulty. The                        patient tolerated the procedure well. The quality of                        the bowel preparation was good. Findings:      The perianal and digital rectal examinations were normal.      Many small and large-mouthed diverticula were  found in the mid rectum       and sigmoid colon.      The exam was otherwise without abnormality on direct and retroflexion       views. Impression:            - Diverticulosis in the mid rectum and in the sigmoid                        colon.                       - The examination was otherwise normal on direct and                        retroflexion views.                       - No specimens collected. Recommendation:       - Discharge patient to home.                       - Repeat colonoscopy in 5 years for surveillance. Procedure Code(s):    --- Professional ---                       [phone], Colonoscopy, flexible; diagnostic, including                        collection of specimen(s) by brushing or washing, when                        performed (separate procedure) Diagnosis Code(s):    --- Professional ---                       [id number], Personal history of other malignant neoplasm                        of large intestine                       K57.30, Diverticulosis of large intestine without                        perforation or abscess without bleeding CPT copyright 2016 American Medical Association. All rights reserved. The codes documented in this report are preliminary and upon coder review may  be revised to meet current compliance requirements. [name], MD 03/10/2016 9:02:13 AM This report has been signed electronically. Number of Addenda: 0 Note Initiated On: 03/10/2016 8:31 AM Scope Withdrawal Time: 0 hours 5 minutes 53 seconds  Total Procedure Duration: 0 hours 19 minutes 7 seconds       [facility]

## 2016-03-10 NOTE — Interval H&P Note (Signed)
History and Physical Interval Note:  03/10/2016 8:27 AM  Lisa Caldwell  has presented today for surgery, with the diagnosis of HX COLON CA  The various methods of treatment have been discussed with the patient and family. After consideration of risks, benefits and other options for treatment, the patient has consented to  Procedure(s): COLONOSCOPY WITH PROPOFOL (N/A) as a surgical intervention .  The patient's history has been reviewed, patient examined, no change in status, stable for surgery.  I have reviewed the patient's chart and labs.  Questions were answered to the patient's satisfaction.     Itxel Wickard G

## 2016-03-10 NOTE — Addendum Note (Signed)
Addendum  created 03/10/16 1110 by Doreen Salvage, CRNA   Modules edited: Anesthesia Medication Administration

## 2016-03-10 NOTE — H&P (View-Only) (Signed)
Patient ID: Lisa Caldwell, female   DOB: 04-18-1946, 70 y.o.   MRN: [id number]  Chief Complaint  Patient presents with  . Follow-up    mammogram  . Colonoscopy    HPI Lisa Caldwell is a 70 y.o. female.  who presents for a breast evaluation and colonoscopy discussion. The most recent mammogram was done on 02-10-16 .  Patient does perform regular self breast checks and gets regular mammograms done.   No new breast issues. Denies any gastrointestinal issues. Bowels are regular and daily, no bleeding.  Since last visit for an unusual neck mass, she under went ENT evaluation and subsequent resection of a thyroglossal duct cyst.  I have reviewed the history of present illness with the patient.  HPI  Past Medical History  Diagnosis Date  . Headache(784.0)   . Personal history of malignant neoplasm of large intestine   . Inflammatory polyps of colon with rectal bleeding ([place]) 2008     rt colectomy for a polyp with carcinoma in situ   . Allergy   . Depression   . Anxiety   . GERD (gastroesophageal reflux disease)     Past Surgical History  Procedure Laterality Date  . Colonoscopy  2012    Dr. Jamal Collin  . Colectomy Right 2008  . Abdominal hysterectomy  1996  . Foot surgery Left 2012  . Foot surgery Left 2013  . Appendectomy    . Thyroglossal duct cyst N/A 09/04/2015    Procedure: THYROGLOSSAL DUCT CYST;  Surgeon: Carloyn Manner, MD;  Location: [facility] ORS;  Service: ENT;  Laterality: N/A;    Family History  Problem Relation Age of Onset  . Heart disease Mother   . Heart disease Father   . Drug abuse Brother   . Breast cancer Neg Hx     Social History Social History  Substance Use Topics  . Smoking status: Never Smoker   . Smokeless tobacco: Never Used  . Alcohol Use: 6.0 oz/week    0 Standard drinks or equivalent, 10 Cans of beer per week     Comment: beer    Allergies  Allergen Reactions  . Codeine Nausea And Vomiting  . Peanut-Containing Drug Products Diarrhea  and Nausea And Vomiting    Pine nut  . Phenergan [Promethazine Hcl] Other (See Comments)    tremors    Current Outpatient Prescriptions  Medication Sig Dispense Refill  . citalopram (CELEXA) 20 MG tablet Take 1 tablet (20 mg total) by mouth daily. (Patient taking differently: Take 20 mg by mouth at bedtime. ) 90 tablet 3  . fluticasone (FLONASE) 50 MCG/ACT nasal spray USE 2 SPRAYS IN EACH NOSTRIL EVERY DAY (SUBSTITUTED FOR FLONASE) 48 g 3  . meclizine (ANTIVERT) 12.5 MG tablet Take 12.5 mg by mouth as needed.     Marland Kitchen omeprazole (PRILOSEC) 20 MG capsule Take 1 capsule (20 mg total) by mouth daily. 90 capsule 3  . ondansetron (ZOFRAN) 4 MG tablet Take 1 tablet (4 mg total) by mouth every 4 (four) hours as needed for nausea. 20 tablet 0  . rizatriptan (MAXALT) 10 MG tablet Take 10 mg by mouth as needed for migraine. May repeat in 2 hours if needed    . polyethylene glycol powder (GLYCOLAX/MIRALAX) powder 255 grams one bottle for colonoscopy prep 255 g 0   No current facility-administered medications for this visit.    Review of Systems Review of Systems  Constitutional: Negative.   Respiratory: Negative.   Cardiovascular: Negative.   Gastrointestinal: Negative  for diarrhea, constipation and blood in stool.    Blood pressure 120/78, pulse 58, resp. rate 12, height 5\' 4"  (1.626 m), weight 153 lb (69.4 kg).  Physical Exam Physical Exam  Constitutional: She is oriented to person, place, and time. She appears well-developed and well-nourished.  Eyes: Conjunctivae are normal. No scleral icterus.  Neck: Neck supple. No thyromegaly present.  Cardiovascular: Normal rate, regular rhythm and normal heart sounds.   Pulmonary/Chest: Effort normal and breath sounds normal.  Abdominal: Soft. Bowel sounds are normal.  Lymphadenopathy:    She has no cervical adenopathy.    She has no axillary adenopathy.  Neurological: She is alert and oriented to person, place, and time.  Skin: Skin is warm and  dry.    Data Reviewed Mammogram reviewed.  Assessment    Stable exam. History of right colon polyp with in situ cancer. S/P right colectomy 2008. Due for surveillance colonoscopy this year      Plan    Patient will be asked to return to the office in one year with a bilateral screening mammogram.  Colonoscopy with possible biopsy/polypectomy prn: Information regarding the procedure, including its potential risks and complications (including but not limited to perforation of the bowel, which may require emergency surgery to repair, and bleeding) was verbally given to the patient. Educational information regarding lower intestinal endoscopy was given to the patient. Written instructions for how to complete the bowel prep using Miralax were provided. The importance of drinking ample fluids to avoid dehydration as a result of the prep emphasized.      Patient has been scheduled for a colonoscopy on 03-10-16 at [facility]   PCP:  Margarita Rana This information has been scribed by Verlene Mayer, CMA.     SANKAR,SEEPLAPUTHUR G 02/18/2016, 2:18 PM

## 2016-03-10 NOTE — Transfer of Care (Signed)
  Immediate Anesthesia Transfer of Care Note  Patient: Lisa Caldwell  Procedure(s) Performed: Procedure(s): COLONOSCOPY WITH PROPOFOL (N/A)  Patient Location: PACU and Endoscopy Unit  Anesthesia Type:General  Level of Consciousness: sedated  Airway & Oxygen Therapy: Patient Spontanous Breathing and Patient connected to nasal cannula oxygen  Post-op Assessment: Report given to RN and Post -op Vital signs reviewed and stable  Post vital signs: Reviewed and stable  Last Vitals:  Filed Vitals:   03/10/16 0803 03/10/16 0904  BP: 146/84 109/64  Pulse: 78 55  Temp: 35.7 C 36.1 C  Resp: 17 16    Complications: No apparent anesthesia complications

## 2016-03-10 NOTE — Anesthesia Preprocedure Evaluation (Signed)
Anesthesia Evaluation  Patient identified by MRN, date of birth, ID band Patient awake    Reviewed: Allergy & Precautions, NPO status , Patient's Chart, lab work & pertinent test results, reviewed documented beta blocker date and time   Airway Mallampati: II  TM Distance: >3 FB     Dental  (+) Chipped   Pulmonary           Cardiovascular      Neuro/Psych  Headaches, PSYCHIATRIC DISORDERS Anxiety Depression    GI/Hepatic GERD  ,  Endo/Other    Renal/GU      Musculoskeletal   Abdominal   Peds  Hematology   Anesthesia Other Findings   Reproductive/Obstetrics                             Anesthesia Physical Anesthesia Plan  ASA: II  Anesthesia Plan: General   Post-op Pain Management:    Induction: Intravenous  Airway Management Planned: Oral ETT  Additional Equipment:   Intra-op Plan:   Post-operative Plan:   Informed Consent: I have reviewed the patients History and Physical, chart, labs and discussed the procedure including the risks, benefits and alternatives for the proposed anesthesia with the patient or authorized representative who has indicated his/her understanding and acceptance.     Plan Discussed with: CRNA  Anesthesia Plan Comments:         Anesthesia Quick Evaluation

## 2016-03-10 NOTE — Telephone Encounter (Signed)
-----   Message from Margarita Rana, MD sent at 03/10/2016  6:34 AM EDT ----- Labs stable except for low vit D. Start Vit D 2000 iu daily . Thanks.

## 2016-03-12 ENCOUNTER — Encounter: Payer: Self-pay | Admitting: General Surgery

## 2016-04-05 ENCOUNTER — Ambulatory Visit
Admission: RE | Admit: 2016-04-05 | Discharge: 2016-04-05 | Disposition: A | Discharge status: Home / Self Care | Payer: Commercial Managed Care - HMO | Source: Ambulatory Visit | Attending: Family Medicine | Admitting: Family Medicine

## 2016-04-05 DIAGNOSIS — Z780 Asymptomatic menopausal state: Secondary | ICD-10-CM | POA: Diagnosis not present

## 2016-04-05 DIAGNOSIS — M8580 Other specified disorders of bone density and structure, unspecified site: Secondary | ICD-10-CM | POA: Diagnosis present

## 2016-04-05 DIAGNOSIS — M85852 Other specified disorders of bone density and structure, left thigh: Secondary | ICD-10-CM | POA: Insufficient documentation

## 2016-04-06 ENCOUNTER — Telehealth: Payer: Self-pay

## 2016-04-06 DIAGNOSIS — M8580 Other specified disorders of bone density and structure, unspecified site: Secondary | ICD-10-CM | POA: Insufficient documentation

## 2016-04-06 NOTE — Telephone Encounter (Signed)
-----   Message from Margarita Rana, MD sent at 04/06/2016  6:54 AM EDT ----- Bone thinning but not osteoporosis. Eat healthy,  Weight bearing exercise, recheck in 2 years.  Thanks.

## 2016-04-06 NOTE — Telephone Encounter (Signed)
Pt advised.   Thanks,   -Alwyn Cordner  

## 2016-05-03 ENCOUNTER — Other Ambulatory Visit: Payer: Self-pay | Admitting: Family Medicine

## 2016-05-03 DIAGNOSIS — F4322 Adjustment disorder with anxiety: Secondary | ICD-10-CM

## 2016-05-03 DIAGNOSIS — J309 Allergic rhinitis, unspecified: Secondary | ICD-10-CM

## 2016-05-03 DIAGNOSIS — K219 Gastro-esophageal reflux disease without esophagitis: Secondary | ICD-10-CM

## 2016-05-05 ENCOUNTER — Encounter: Payer: Commercial Managed Care - HMO | Admitting: Physician Assistant

## 2016-05-13 ENCOUNTER — Encounter: Payer: Self-pay | Admitting: Physician Assistant

## 2016-05-13 ENCOUNTER — Ambulatory Visit (INDEPENDENT_AMBULATORY_CARE_PROVIDER_SITE_OTHER): Payer: Commercial Managed Care - HMO | Admitting: Physician Assistant

## 2016-05-13 VITALS — BP 120/80 | HR 75 | Temp 98.6°F | Ht 64.0 in | Wt 156.2 lb

## 2016-05-13 DIAGNOSIS — Z0000 Encounter for general adult medical examination without abnormal findings: Secondary | ICD-10-CM | POA: Diagnosis not present

## 2016-05-13 DIAGNOSIS — F1010 Alcohol abuse, uncomplicated: Secondary | ICD-10-CM

## 2016-05-13 NOTE — Patient Instructions (Addendum)
* Call in 4-5 months for labs *  Menopause is a normal process in which your reproductive ability comes to an end. This process happens gradually over a span of months to years, usually between the ages of 44 and 69. Menopause is complete when you have missed 12 consecutive menstrual periods. It is important to talk with your health care provider about some of the most common conditions that affect postmenopausal women, such as heart disease, cancer, and bone loss (osteoporosis). Adopting a healthy lifestyle and getting preventive care can help to promote your health and wellness. Those actions can also lower your chances of developing some of these common conditions. WHAT SHOULD I KNOW ABOUT MENOPAUSE? During menopause, you may experience a number of symptoms, such as:  Moderate-to-severe hot flashes.  Night sweats.  Decrease in sex drive.  Mood swings.  Headaches.  Tiredness.  Irritability.  Memory problems.  Insomnia. Choosing to treat or not to treat menopausal changes is an individual decision that you make with your health care provider. WHAT SHOULD I KNOW ABOUT HORMONE REPLACEMENT THERAPY AND SUPPLEMENTS? Hormone therapy products are effective for treating symptoms that are associated with menopause, such as hot flashes and night sweats. Hormone replacement carries certain risks, especially as you become older. If you are thinking about using estrogen or estrogen with progestin treatments, discuss the benefits and risks with your health care provider. WHAT SHOULD I KNOW ABOUT HEART DISEASE AND STROKE? Heart disease, heart attack, and stroke become more likely as you age. This may be due, in part, to the hormonal changes that your body experiences during menopause. These can affect how your body processes dietary fats, triglycerides, and cholesterol. Heart attack and stroke are both medical emergencies. There are many things that you can do to help prevent heart disease and  stroke:  Have your blood pressure checked at least every 1-2 years. High blood pressure causes heart disease and increases the risk of stroke.  If you are 33-44 years old, ask your health care provider if you should take aspirin to prevent a heart attack or a stroke.  Do not use any tobacco products, including cigarettes, chewing tobacco, or electronic cigarettes. If you need help quitting, ask your health care provider.  It is important to eat a healthy diet and maintain a healthy weight.  Be sure to include plenty of vegetables, fruits, low-fat dairy products, and lean protein.  Avoid eating foods that are high in solid fats, added sugars, or salt (sodium).  Get regular exercise. This is one of the most important things that you can do for your health.  Try to exercise for at least 150 minutes each week. The type of exercise that you do should increase your heart rate and make you sweat. This is known as moderate-intensity exercise.  Try to do strengthening exercises at least twice each week. Do these in addition to the moderate-intensity exercise.  Know your numbers.Ask your health care provider to check your cholesterol and your blood glucose. Continue to have your blood tested as directed by your health care provider. WHAT SHOULD I KNOW ABOUT CANCER SCREENING? There are several types of cancer. Take the following steps to reduce your risk and to catch any cancer development as early as possible. Breast Cancer  Practice breast self-awareness.  This means understanding how your breasts normally appear and feel.  It also means doing regular breast self-exams. Let your health care provider know about any changes, no matter how small.  If you  are 19 or older, have a clinician do a breast exam (clinical breast exam or CBE) every year. Depending on your age, family history, and medical history, it may be recommended that you also have a yearly breast X-ray (mammogram).  If you have a  family history of breast cancer, talk with your health care provider about genetic screening.  If you are at high risk for breast cancer, talk with your health care provider about having an MRI and a mammogram every year.  Breast cancer (BRCA) gene test is recommended for women who have family members with BRCA-related cancers. Results of the assessment will determine the need for genetic counseling and BRCA1 and for BRCA2 testing. BRCA-related cancers include these types:  Breast. This occurs in males or females.  Ovarian.  Tubal. This may also be called fallopian tube cancer.  Cancer of the abdominal or pelvic lining (peritoneal cancer).  Prostate.  Pancreatic. Cervical, Uterine, and Ovarian Cancer Your health care provider may recommend that you be screened regularly for cancer of the pelvic organs. These include your ovaries, uterus, and vagina. This screening involves a pelvic exam, which includes checking for microscopic changes to the surface of your cervix (Pap test).  For women ages 21-65, health care providers may recommend a pelvic exam and a Pap test every three years. For women ages 59-65, they may recommend the Pap test and pelvic exam, combined with testing for human papilloma virus (HPV), every five years. Some types of HPV increase your risk of cervical cancer. Testing for HPV may also be done on women of any age who have unclear Pap test results.  Other health care providers may not recommend any screening for nonpregnant women who are considered low risk for pelvic cancer and have no symptoms. Ask your health care provider if a screening pelvic exam is right for you.  If you have had past treatment for cervical cancer or a condition that could lead to cancer, you need Pap tests and screening for cancer for at least 20 years after your treatment. If Pap tests have been discontinued for you, your risk factors (such as having a new sexual partner) need to be reassessed to  determine if you should start having screenings again. Some women have medical problems that increase the chance of getting cervical cancer. In these cases, your health care provider may recommend that you have screening and Pap tests more often.  If you have a family history of uterine cancer or ovarian cancer, talk with your health care provider about genetic screening.  If you have vaginal bleeding after reaching menopause, tell your health care provider.  There are currently no reliable tests available to screen for ovarian cancer. Lung Cancer Lung cancer screening is recommended for adults 37-63 years old who are at high risk for lung cancer because of a history of smoking. A yearly low-dose CT scan of the lungs is recommended if you:  Currently smoke.  Have a history of at least 30 pack-years of smoking and you currently smoke or have quit within the past 15 years. A pack-year is smoking an average of one pack of cigarettes per day for one year. Yearly screening should:  Continue until it has been 15 years since you quit.  Stop if you develop a health problem that would prevent you from having lung cancer treatment. Colorectal Cancer  This type of cancer can be detected and can often be prevented.  Routine colorectal cancer screening usually begins at age 75  and continues through age 90.  If you have risk factors for colon cancer, your health care provider may recommend that you be screened at an earlier age.  If you have a family history of colorectal cancer, talk with your health care provider about genetic screening.  Your health care provider may also recommend using home test kits to check for hidden blood in your stool.  A small camera at the end of a tube can be used to examine your colon directly (sigmoidoscopy or colonoscopy). This is done to check for the earliest forms of colorectal cancer.  Direct examination of the colon should be repeated every 5-10 years until age  47. However, if early forms of precancerous polyps or small growths are found or if you have a family history or genetic risk for colorectal cancer, you may need to be screened more often. Skin Cancer  Check your skin from head to toe regularly.  Monitor any moles. Be sure to tell your health care provider:  About any new moles or changes in moles, especially if there is a change in a mole's shape or color.  If you have a mole that is larger than the size of a pencil eraser.  If any of your family members has a history of skin cancer, especially at a young age, talk with your health care provider about genetic screening.  Always use sunscreen. Apply sunscreen liberally and repeatedly throughout the day.  Whenever you are outside, protect yourself by wearing long sleeves, pants, a wide-brimmed hat, and sunglasses. WHAT SHOULD I KNOW ABOUT OSTEOPOROSIS? Osteoporosis is a condition in which bone destruction happens more quickly than new bone creation. After menopause, you may be at an increased risk for osteoporosis. To help prevent osteoporosis or the bone fractures that can happen because of osteoporosis, the following is recommended:  If you are 56-58 years old, get at least 1,000 mg of calcium and at least 600 mg of vitamin D per day.  If you are older than age 51 but younger than age 16, get at least 1,200 mg of calcium and at least 600 mg of vitamin D per day.  If you are older than age 30, get at least 1,200 mg of calcium and at least 800 mg of vitamin D per day. Smoking and excessive alcohol intake increase the risk of osteoporosis. Eat foods that are rich in calcium and vitamin D, and do weight-bearing exercises several times each week as directed by your health care provider. WHAT SHOULD I KNOW ABOUT HOW MENOPAUSE AFFECTS [place]? Depression may occur at any age, but it is more common as you become older. Common symptoms of depression include:  Low or sad mood.  Changes  in sleep patterns.  Changes in appetite or eating patterns.  Feeling an overall lack of motivation or enjoyment of activities that you previously enjoyed.  Frequent crying spells. Talk with your health care provider if you think that you are experiencing depression. WHAT SHOULD I KNOW ABOUT IMMUNIZATIONS? It is important that you get and maintain your immunizations. These include:  Tetanus, diphtheria, and pertussis (Tdap) booster vaccine.  Influenza every year before the flu season begins.  Pneumonia vaccine.  Shingles vaccine. Your health care provider may also recommend other immunizations.   This information is not intended to replace advice given to you by your health care provider. Make sure you discuss any questions you have with your health care provider.   Document Released: 11/26/2005 Document Revised: 10/25/2014  Document Reviewed: 06/06/2014 Elsevier Interactive Patient Education [employer].

## 2016-05-13 NOTE — Progress Notes (Signed)
Patient: Lisa Caldwell, Female    DOB: 11/26/1945, 70 y.o.   MRN: [id number] Visit Date: 05/16/2016  Today's Provider: Mar Daring, PA-C   Chief Complaint  Patient presents with  . Medicare Wellness   Subjective:    Annual wellness visit Lisa Caldwell is a 70 y.o. female. She feels well. She reports exercising -walks and does weight training. She reports she is sleeping well.  Colonoscopy: 03/10/2016 Mammogram: 02/03/2015 BMD: 04/05/2016 Pap: Hysterectomy -----------------------------------------------------------   Review of Systems  Constitutional: Negative.   HENT: Negative.   Eyes: Negative.   Respiratory: Negative.   Cardiovascular: Negative.   Gastrointestinal: Negative.   Endocrine: Negative.   Genitourinary: Negative.   Musculoskeletal: Negative.   Skin: Negative.   Allergic/Immunologic: Negative.   Neurological: Negative.   Hematological: Negative.   Psychiatric/Behavioral: Negative.     Social History   Social History  . Marital status: Married    Spouse name: Gershon Mussel  . Number of children: 2  . Years of education: N/A   Occupational History  . Retired    Social History Main Topics  . Smoking status: Never Smoker  . Smokeless tobacco: Never Used  . Alcohol use 6.0 oz/week    10 Cans of beer per week     Comment: beer  . Drug use: No  . Sexual activity: No     Comment: hysterectomy   Other Topics Concern  . Not on file   Social History Narrative  . No narrative on file    Past Medical History:  Diagnosis Date  . Allergy   . Anxiety   . Cancer [facility])    colon cancer  . Depression   . GERD (gastroesophageal reflux disease)   . Headache(784.0)   . Inflammatory polyps of colon with rectal bleeding ([place]) 2008    rt colectomy for a polyp with carcinoma in situ   . Personal history of malignant neoplasm of large intestine      Patient Active Problem List   Diagnosis Date Noted  . Alcohol abuse 05/16/2016  .  Osteopenia 04/06/2016  . History of thyroglossal duct cyst removal 09/04/2015  . Persistent thyroglossal duct cyst 07/25/2015  . Adaptation reaction 03/04/2015  . Screening for diabetes mellitus 03/04/2015  . Colon, diverticulosis 03/04/2015  . Elevated blood sugar 03/04/2015  . Acid reflux 03/04/2015  . High potassium 03/04/2015  . Hammer toe 03/04/2015  . Headache, migraine 03/04/2015  . Allergic rhinitis 04/03/2013  . Clinical depression 04/03/2013  . History of colon cancer 01/31/2013    Past Surgical History:  Procedure Laterality Date  . ABDOMINAL HYSTERECTOMY  1996  . APPENDECTOMY    . COLECTOMY Right 2008  . COLONOSCOPY  2012   Dr. Jamal Collin  . COLONOSCOPY WITH PROPOFOL N/A 03/10/2016   Procedure: COLONOSCOPY WITH PROPOFOL;  Surgeon: [name], MD;  Location: [facility] ENDOSCOPY;  Service: Endoscopy;  Laterality: N/A;  . FOOT SURGERY Left 2012  . FOOT SURGERY Left 2013  . THYROGLOSSAL DUCT CYST N/A 09/04/2015   Procedure: THYROGLOSSAL DUCT CYST;  Surgeon: Carloyn Manner, MD;  Location: [facility] ORS;  Service: ENT;  Laterality: N/A;    Her family history includes Drug abuse in her brother; Heart disease in her father and mother.    Current Meds  Medication Sig  . aspirin 81 MG tablet Take 81 mg by mouth daily.  . Cholecalciferol (VITAMIN D3 PO) Take by mouth.  . citalopram (CELEXA) 20 MG tablet TAKE 1  TABLET EVERY DAY  . fluticasone (FLONASE) 50 MCG/ACT nasal spray USE 2 SPRAYS IN EACH NOSTRIL EVERY DAY (SUBSTITUTED FOR FLONASE)  . meclizine (ANTIVERT) 12.5 MG tablet Take 12.5 mg by mouth as needed. Reported on 03/10/2016  . Multiple Vitamin (MULTIVITAMIN) tablet Take 1 tablet by mouth daily.  Marland Kitchen omeprazole (PRILOSEC) 20 MG capsule TAKE 1 CAPSULE EVERY DAY  . rizatriptan (MAXALT) 10 MG tablet Take 10 mg by mouth as needed for migraine. May repeat in 2 hours if needed    Patient Care Team: Mar Daring, PA-C as PCP - General (Family Medicine) Seeplaputhur Robinette Haines, MD (General Surgery)    Objective:   Vitals: BP 120/80 (BP Location: Left Arm, Patient Position: Sitting, Cuff Size: Normal)   Pulse 75   Temp 98.6 F (37 C) (Oral)   Ht 5\' 4"  (1.626 m)   Wt 156 lb 3.2 oz (70.9 kg)   BMI 26.81 kg/m   Physical Exam  Constitutional: She is oriented to person, place, and time. She appears well-developed and well-nourished. No distress.  HENT:  Head: Normocephalic and atraumatic.  Right Ear: External ear normal.  Left Ear: External ear normal.  Nose: Nose normal.  Mouth/Throat: Oropharynx is clear and moist. No oropharyngeal exudate.  Eyes: Conjunctivae and EOM are normal. Pupils are equal, round, and reactive to light. Right eye exhibits no discharge. Left eye exhibits no discharge. No scleral icterus.  Neck: Normal range of motion. Neck supple. No JVD present. No tracheal deviation present. No thyromegaly present.  Cardiovascular: Normal rate, regular rhythm, normal heart sounds and intact distal pulses.  Exam reveals no gallop and no friction rub.   No murmur heard. Pulmonary/Chest: Effort normal and breath sounds normal. No respiratory distress. She has no wheezes. She has no rales. She exhibits no tenderness.  Abdominal: Soft. Bowel sounds are normal. She exhibits no distension and no mass. There is no tenderness. There is no rebound and no guarding.  Musculoskeletal: Normal range of motion. She exhibits no edema or tenderness.  Lymphadenopathy:    She has no cervical adenopathy.  Neurological: She is alert and oriented to person, place, and time.  Skin: Skin is warm and dry. No rash noted. She is not diaphoretic.  Psychiatric: She has a normal mood and affect. Her behavior is normal. Judgment and thought content normal.  Vitals reviewed.   Activities of Daily Living In your present state of health, do you have any difficulty performing the following activities: 05/13/2016 09/04/2015  Hearing? N -  Vision? N -  Difficulty  concentrating or making decisions? N -  Walking or climbing stairs? N -  Dressing or bathing? N -  Doing errands, shopping? N N  Some recent data might be hidden    Fall Risk Assessment Fall Risk  05/13/2016 05/05/2015  Falls in the past year? No No     Depression Screen PHQ 2/9 Scores 05/13/2016 05/05/2015  PHQ - 2 Score 0 0    Cognitive Testing - 6-CIT  Correct? Score   What year is it? yes 0 0 or 4  What month is it? yes 0 0 or 3  Memorize:    [name],  42,  [place],      What time is it? (within 1 hour) yes 0 0 or 3  Count backwards from 20 yes 0 0, 2, or 4  Name the months of the year yes 0 0, 2, or 4  Repeat name & address above  yes 0 0, 2, 4, 6, 8, or 10       TOTAL SCORE  0/28   Interpretation:  Normal  Normal (0-7) Abnormal (8-28)   Audit-C Alcohol Use Screening  Question Answer Points  How often do you have alcoholic drink? 4 or more times daily 4  On days you do drink alcohol, how many drinks do you typically consume? 5-6 2  How oftey will you drink 6 or more in a total? daily times daily 4  Total Score:  10   A score of 3 or more in women, and 4 or more in men indicates increased risk for alcohol abuse, EXCEPT if all of the points are from question 1.   Visual Acuity Screening   Right eye Left eye Both eyes  Without correction:     With correction: 20/20 20/25 20/30      Assessment & Plan:     Annual Wellness Visit  Reviewed patient's Family Medical History Reviewed and updated list of patient's medical providers Assessment of cognitive impairment was done Assessed patient's functional ability Established a written schedule for health screening [place] Completed and Reviewed  Exercise Activities and Dietary recommendations Goals    . Exercise 150 minutes per week (moderate activity)    . Reduce alcohol intake to 1 serving a day.        Immunization History  Administered Date(s) Administered  .  Influenza, High Dose Seasonal PF 07/19/2015  . Pneumococcal Conjugate-13 05/05/2015  . Pneumococcal Polysaccharide-23 08/27/2011  . Tdap 01/11/2006  . Zoster 08/17/2011    Health Maintenance  Topic Date Due  . Hepatitis C Screening  1946/01/08  . TETANUS/TDAP  01/12/2016  . INFLUENZA VACCINE  05/18/2016  . MAMMOGRAM  02/09/2018  . COLONOSCOPY  03/11/2023  . DEXA SCAN  Completed  . ZOSTAVAX  Completed  . PNA vac Low Risk Adult  Completed      Discussed health benefits of physical activity, and encouraged her to engage in regular exercise appropriate for her age and condition.    1. Medicare annual wellness visit, subsequent Normal physical exam. Had labs in 02/2016 which were normal. Had mammogram in 01/2016 and BMD in 03/2016. We will recheck labs in approx. 4 months. She will call for these.  2. Alcohol abuse Discussed decreasing how much she drinks daily. She voiced understanding but refused to decrease intake.   ------------------------------------------------------------------------------------------------------------    Mar Daring, PA-C  [facility]

## 2016-05-16 DIAGNOSIS — F1010 Alcohol abuse, uncomplicated: Secondary | ICD-10-CM | POA: Insufficient documentation

## 2016-05-24 NOTE — Telephone Encounter (Signed)
error 

## 2016-06-14 ENCOUNTER — Encounter: Payer: Self-pay | Admitting: General Surgery

## 2016-07-09 ENCOUNTER — Ambulatory Visit (INDEPENDENT_AMBULATORY_CARE_PROVIDER_SITE_OTHER): Payer: Commercial Managed Care - HMO

## 2016-07-09 DIAGNOSIS — Z23 Encounter for immunization: Secondary | ICD-10-CM | POA: Diagnosis not present

## 2016-10-21 ENCOUNTER — Ambulatory Visit (INDEPENDENT_AMBULATORY_CARE_PROVIDER_SITE_OTHER): Payer: Medicare HMO | Admitting: Physician Assistant

## 2016-10-21 ENCOUNTER — Encounter: Payer: Self-pay | Admitting: Physician Assistant

## 2016-10-21 DIAGNOSIS — K219 Gastro-esophageal reflux disease without esophagitis: Secondary | ICD-10-CM

## 2016-10-21 MED ORDER — OMEPRAZOLE 20 MG PO CPDR: 20.0000 mg | DELAYED_RELEASE_CAPSULE | Freq: Two times a day (BID) | ORAL | 1 refills | Status: DC

## 2016-10-21 NOTE — Patient Instructions (Signed)
Food Choices for Gastroesophageal Reflux Disease, Adult When you have gastroesophageal reflux disease (GERD), the foods you eat and your eating habits are very important. Choosing the right foods can help ease the discomfort of GERD. What general guidelines do I need to follow?  Choose fruits, vegetables, whole grains, low-fat dairy products, and low-fat meat, fish, and poultry.  Limit fats such as oils, salad dressings, butter, nuts, and avocado.  Keep a food diary to identify foods that cause symptoms.  Avoid foods that cause reflux. These may be different for different people.  Eat frequent small meals instead of three large meals each day.  Eat your meals slowly, in a relaxed setting.  Limit fried foods.  Cook foods using methods other than frying.  Avoid drinking alcohol.  Avoid drinking large amounts of liquids with your meals.  Avoid bending over or lying down until 2-3 hours after eating. What foods are not recommended? The following are some foods and drinks that may worsen your symptoms: Vegetables  Tomatoes. Tomato juice. Tomato and spaghetti sauce. Chili peppers. Onion and garlic. Horseradish. Fruits  Oranges, grapefruit, and lemon (fruit and juice). Meats  High-fat meats, fish, and poultry. This includes hot dogs, ribs, ham, sausage, salami, and bacon. Dairy  Whole milk and chocolate milk. Sour cream. Cream. Butter. Ice cream. Cream cheese. Beverages  Coffee and tea, with or without caffeine. Carbonated beverages or energy drinks. Condiments  Hot sauce. Barbecue sauce. Sweets/Desserts  Chocolate and cocoa. Donuts. Peppermint and spearmint. Fats and Oils  High-fat foods, including [place] fries and potato chips. Other  Vinegar. Strong spices, such as black pepper, white pepper, red pepper, cayenne, curry powder, cloves, ginger, and chili powder. The items listed above may not be a complete list of foods and beverages to avoid. Contact your dietitian for more  information.  This information is not intended to replace advice given to you by your health care provider. Make sure you discuss any questions you have with your health care provider. Document Released: 10/04/2005 Document Revised: 03/11/2016 Document Reviewed: 08/08/2013 Elsevier Interactive Patient Education  2017 [employer].

## 2016-10-21 NOTE — Progress Notes (Signed)
Patient: Lisa Caldwell Female    DOB: 01-23-1946   71 y.o.   MRN: [id number] Visit Date: 10/21/2016  Today's Provider: Mar Daring, PA-C   Chief Complaint  Patient presents with  . Abdominal Pain   Subjective:    HPI Abdominal Pain: Patient complains of abdominal pain. The pain is described as pressure-like, and is 4/10 in intensity. Pain is located in the epigastric with radiation to chest. Onset was 2 weeks ago. Symptoms have been gradually improving since. Aggravating factors: eating.  Alleviating factors: antacids. Associated symptoms: belching, diarrhea, flatus, nausea and vomiting. The patient denies constipation, dysuria and fever. Patient reports symptoms are better today.  She only had diarrhea x 1 day. No melena or hematochezia. She states normal BM now. Vomiting was only on first day as well and only had vomiting of acid and spit, no food. She has been improving since.    Allergies  Allergen Reactions  . Codeine Nausea And Vomiting  . Peanut-Containing Drug Products Diarrhea and Nausea And Vomiting    Pine nut  . Phenergan [Promethazine Hcl] Other (See Comments)    tremors     Current Outpatient Prescriptions:  .  aspirin 81 MG tablet, Take 81 mg by mouth daily., Disp: , Rfl:  .  Cholecalciferol (VITAMIN D3 PO), Take by mouth., Disp: , Rfl:  .  citalopram (CELEXA) 20 MG tablet, TAKE 1 TABLET EVERY DAY, Disp: 90 tablet, Rfl: 3 .  fluticasone (FLONASE) 50 MCG/ACT nasal spray, USE 2 SPRAYS IN EACH NOSTRIL EVERY DAY (SUBSTITUTED FOR FLONASE), Disp: 48 g, Rfl: 3 .  Multiple Vitamin (MULTIVITAMIN) tablet, Take 1 tablet by mouth daily., Disp: , Rfl:  .  omeprazole (PRILOSEC) 20 MG capsule, TAKE 1 CAPSULE EVERY DAY, Disp: 90 capsule, Rfl: 3 .  rizatriptan (MAXALT) 10 MG tablet, Take 10 mg by mouth as needed for migraine. May repeat in 2 hours if needed, Disp: , Rfl:   Review of Systems  Constitutional: Positive for appetite change.  HENT: Negative.     Respiratory: Negative.   Cardiovascular: Negative.   Gastrointestinal: Positive for abdominal distention, abdominal pain, diarrhea, nausea and vomiting.  Genitourinary: Negative.   Musculoskeletal: Negative for back pain.  Neurological: Negative for dizziness, weakness, numbness and headaches.    Social History  Substance Use Topics  . Smoking status: Never Smoker  . Smokeless tobacco: Never Used  . Alcohol use 6.0 oz/week    10 Cans of beer per week     Comment: beer   Objective:   BP 140/84 (BP Location: Right Arm, Patient Position: Sitting, Cuff Size: Normal)   Pulse 80   Temp 98.3 F (36.8 C) (Oral)   Resp 16   Ht 5' 4.5" (1.638 m)   Wt 152 lb (68.9 kg)   BMI 25.69 kg/m   Physical Exam  Constitutional: She is oriented to person, place, and time. She appears well-developed and well-nourished. No distress.  Cardiovascular: Normal rate, regular rhythm and normal heart sounds.  Exam reveals no gallop and no friction rub.   No murmur heard. Pulmonary/Chest: Effort normal and breath sounds normal. No respiratory distress. She has no wheezes. She has no rales.  Abdominal: Soft. Normal appearance and bowel sounds are normal. She exhibits no distension and no mass. There is no hepatosplenomegaly. There is tenderness in the epigastric area. There is no rebound, no guarding and no CVA tenderness.  Neurological: She is alert and oriented to person, place, and time.  Skin: Skin is warm and dry. She is not diaphoretic.  Vitals reviewed.     Assessment & Plan:     1. Gastroesophageal reflux disease without esophagitis Tenderness through epigastrum on exam. Possible viral gastritis with worsening GERD symptoms. She may increase prilosec to 1 capsule BID x 4 weeks and then decrease back to 1 capsule daily. She is to call if symptoms return or worsen. - omeprazole (PRILOSEC) 20 MG capsule; Take 1 capsule (20 mg total) by mouth 2 (two) times daily before a meal.  Dispense: 180 capsule;  Refill: [place], PA-C  [facility]

## 2016-12-03 ENCOUNTER — Other Ambulatory Visit: Payer: Self-pay

## 2016-12-03 DIAGNOSIS — Z1231 Encounter for screening mammogram for malignant neoplasm of breast: Secondary | ICD-10-CM

## 2017-01-10 ENCOUNTER — Other Ambulatory Visit: Payer: Self-pay | Admitting: Physician Assistant

## 2017-01-10 DIAGNOSIS — K219 Gastro-esophageal reflux disease without esophagitis: Secondary | ICD-10-CM

## 2017-01-11 NOTE — Telephone Encounter (Signed)
Last ov 10/21/16 Last filled 10/21/16 Please review. Thank you. sd

## 2017-01-31 ENCOUNTER — Encounter: Payer: Self-pay | Admitting: Physician Assistant

## 2017-01-31 ENCOUNTER — Ambulatory Visit (INDEPENDENT_AMBULATORY_CARE_PROVIDER_SITE_OTHER): Payer: Medicare HMO | Admitting: Physician Assistant

## 2017-01-31 VITALS — BP 100/62 | HR 72 | Temp 98.2°F | Resp 16 | Ht 64.5 in | Wt 147.6 lb

## 2017-01-31 DIAGNOSIS — S39012A Strain of muscle, fascia and tendon of lower back, initial encounter: Secondary | ICD-10-CM | POA: Diagnosis not present

## 2017-01-31 DIAGNOSIS — M19041 Primary osteoarthritis, right hand: Secondary | ICD-10-CM

## 2017-01-31 NOTE — Patient Instructions (Signed)
Arthritis Arthritis means joint pain. It can also mean joint disease. A joint is a place where bones come together. People who have arthritis may have:  Red joints.  Swollen joints.  Stiff joints.  Warm joints.  A fever.  A feeling of being sick. Follow these instructions at home: Pay attention to any changes in your symptoms. Take these actions to help with your pain and swelling. Medicines   Take over-the-counter and prescription medicines only as told by your doctor.  Do not take aspirin for pain if your doctor says that you may have gout. Activity   Rest your joint if your doctor tells you to.  Avoid activities that make the pain worse.  Exercise your joint regularly as told by your doctor. Try doing exercises like:  Swimming.  Water aerobics.  Biking.  Walking. Joint Care    If your joint is swollen, keep it raised (elevated) if told by your doctor.  If your joint feels stiff in the morning, try taking a warm shower.  If you have diabetes, do not apply heat without asking your doctor.  If told, apply heat to the joint:  Put a towel between the joint and the hot pack or heating pad.  Leave the heat on the area for 20-30 minutes.  If told, apply ice to the joint:  Put ice in a plastic bag.  Place a towel between your skin and the bag.  Leave the ice on for 20 minutes, 2-3 times per day.  Keep all follow-up visits as told by your doctor. Contact a doctor if:  The pain gets worse.  You have a fever. Get help right away if:  You have very bad pain in your joint.  You have swelling in your joint.  Your joint is red.  Many joints become painful and swollen.  You have very bad back pain.  Your leg is very weak.  You cannot control your pee (urine) or poop (stool). This information is not intended to replace advice given to you by your health care provider. Make sure you discuss any questions you have with your health care  provider. Document Released: 12/29/2009 Document Revised: 03/11/2016 Document Reviewed: 12/30/2014 Elsevier Interactive Patient Education  2017 Else[employer] Inc[employer]   Back Pain, Adult Back pain is very common. The pain often gets better over time. The cause of back pain is usually not dangerous. Most people can learn to manage their back pain on their own. Follow these instructions at home: Watch your back pain for any changes. The following actions may help to lessen any pain you are feeling:  Stay active. Start with short walks on flat ground if you can. Try to walk farther each day.  Exercise regularly as told by your doctor. Exercise helps your back heal faster. It also helps avoid future injury by keeping your muscles strong and flexible.  Do not sit, drive, or stand in one place for more than 30 minutes.  Do not stay in bed. Resting more than 1-2 days can slow down your recovery.  Be careful when you bend or lift an object. Use good form when lifting:  Bend at your knees.  Keep the object close to your body.  Do not twist.  Sleep on a firm mattress. Lie on your side, and bend your knees. If you lie on your back, put a pillow under your knees.  Take medicines only as told by your doctor.  Put ice on the injured area.  Put  ice in a plastic bag.  Place a towel between your skin and the bag.  Leave the ice on for 20 minutes, 2-3 times a day for the first 2-3 days. After that, you can switch between ice and heat packs.  Avoid feeling anxious or stressed. Find good ways to deal with stress, such as exercise.  Maintain a healthy weight. Extra weight puts stress on your back. Contact a doctor if:  You have pain that does not go away with rest or medicine.  You have worsening pain that goes down into your legs or buttocks.  You have pain that does not get better in one week.  You have pain at night.  You lose weight.  You have a fever or chills. Get help right away  if:  You cannot control when you poop (bowel movement) or pee (urinate).  Your arms or legs feel weak.  Your arms or legs lose feeling (numbness).  You feel sick to your stomach (nauseous) or throw up (vomit).  You have belly (abdominal) pain.  You feel like you may pass out (faint). This information is not intended to replace advice given to you by your health care provider. Make sure you discuss any questions you have with your health care provider. Document Released: 03/22/2008 Document Revised: 03/11/2016 Document Reviewed: 02/05/2014 Elsevier Interactive Patient Education  2017 [employer].

## 2017-01-31 NOTE — Progress Notes (Signed)
Patient: Lisa Caldwell Female    DOB: 02/15/1946   71 y.o.   MRN: [id number] Visit Date: 01/31/2017  Today's Provider: Mar Daring, PA-C   Chief Complaint  Patient presents with  . Hand Problem   Subjective:    HPI Patient here today C/O right hand problem x' 3-4 weeks. Patient denies injuries to hand. Patient reports some pain and swelling with hand movement. Patient reports she has been taking Advil PRN.  Patient also C/O of lower back pain on the right side radiating down to right leg on and off x's 3-4 weeks. Patient denies numbness to leg or back. Patient reports pain is worse with inactivity. Patient reports pain with bend for a long period of time. She reports this has improved with Advil 400mg  BID.     Allergies  Allergen Reactions  . Codeine Nausea And Vomiting  . Peanut-Containing Drug Products Diarrhea and Nausea And Vomiting    Pine nut  . Phenergan [Promethazine Hcl] Other (See Comments)    tremors     Current Outpatient Prescriptions:  .  aspirin 81 MG tablet, Take 81 mg by mouth daily., Disp: , Rfl:  .  Cholecalciferol (VITAMIN D3 PO), Take by mouth., Disp: , Rfl:  .  citalopram (CELEXA) 20 MG tablet, TAKE 1 TABLET EVERY DAY, Disp: 90 tablet, Rfl: 3 .  fluticasone (FLONASE) 50 MCG/ACT nasal spray, USE 2 SPRAYS IN EACH NOSTRIL EVERY DAY (SUBSTITUTED FOR FLONASE), Disp: 48 g, Rfl: 3 .  Multiple Vitamin (MULTIVITAMIN) tablet, Take 1 tablet by mouth daily., Disp: , Rfl:  .  omeprazole (PRILOSEC) 20 MG capsule, TAKE 1 CAPSULE TWICE DAILY BEFORE A MEAL, Disp: 180 capsule, Rfl: 1 .  rizatriptan (MAXALT) 10 MG tablet, Take 10 mg by mouth as needed for migraine. May repeat in 2 hours if needed, Disp: , Rfl:   Review of Systems  Constitutional: Negative.   Respiratory: Negative.   Cardiovascular: Negative.   Gastrointestinal: Negative.   Musculoskeletal: Positive for arthralgias, back pain and myalgias.  Neurological: Negative.     Social History    Substance Use Topics  . Smoking status: Never Smoker  . Smokeless tobacco: Never Used  . Alcohol use 6.0 oz/week    10 Cans of beer per week     Comment: beer   Objective:   BP 100/62 (BP Location: Left Arm, Patient Position: Sitting, Cuff Size: Large)   Pulse 72   Temp 98.2 F (36.8 C) (Oral)   Resp 16   Ht 5' 4.5" (1.638 m)   Wt 147 lb 9.6 oz (67 kg)   SpO2 97%   BMI 24.94 kg/m  Vitals:   01/31/17 1105  BP: 100/62  Pulse: 72  Resp: 16  Temp: 98.2 F (36.8 C)  TempSrc: Oral  SpO2: 97%  Weight: 147 lb 9.6 oz (67 kg)  Height: 5' 4.5" (1.638 m)     Physical Exam  Constitutional: She appears well-developed and well-nourished. No distress.  Neck: Normal range of motion. Neck supple. No JVD present. No tracheal deviation present. No thyromegaly present.  Cardiovascular: Normal rate, regular rhythm and normal heart sounds.  Exam reveals no gallop and no friction rub.   No murmur heard. Pulmonary/Chest: Effort normal and breath sounds normal. No respiratory distress. She has no wheezes. She has no rales.  Musculoskeletal:       Right hand: She exhibits swelling. She exhibits normal range of motion, no tenderness, no bony tenderness and normal  two-point discrimination. Normal sensation noted. Normal strength noted. She exhibits no thumb/finger opposition.       Hands: Lymphadenopathy:    She has no cervical adenopathy.  Skin: She is not diaphoretic.  Vitals reviewed.      Assessment & Plan:     1. Primary osteoarthritis of right hand No weakness, numbness or pain noted yet. Discussed prn use of advil. She is to call if pain worsens or becomes more persistent and may consider a daily NSAID.   2. Strain of lumbar region, initial encounter Improving with advil 400mg  BID. She is to call if symptoms worsen.       Mar Daring, PA-C  [facility]

## 2017-02-10 ENCOUNTER — Ambulatory Visit
Admission: RE | Admit: 2017-02-10 | Discharge: 2017-02-10 | Disposition: A | Discharge status: Home / Self Care | Payer: Commercial Managed Care - HMO | Source: Ambulatory Visit | Attending: General Surgery | Admitting: General Surgery

## 2017-02-10 DIAGNOSIS — Z1231 Encounter for screening mammogram for malignant neoplasm of breast: Secondary | ICD-10-CM | POA: Insufficient documentation

## 2017-02-16 ENCOUNTER — Ambulatory Visit: Payer: Commercial Managed Care - HMO | Admitting: General Surgery

## 2017-02-22 ENCOUNTER — Ambulatory Visit: Payer: Commercial Managed Care - HMO | Admitting: General Surgery

## 2017-03-08 ENCOUNTER — Ambulatory Visit: Payer: Commercial Managed Care - HMO | Admitting: General Surgery

## 2017-04-18 ENCOUNTER — Telehealth: Payer: Self-pay | Admitting: Physician Assistant

## 2017-04-18 NOTE — Telephone Encounter (Signed)
Pt returned call and is scheduled for AWV with [facility] on 05/12/17. Thanks TNP

## 2017-04-28 ENCOUNTER — Ambulatory Visit (INDEPENDENT_AMBULATORY_CARE_PROVIDER_SITE_OTHER): Payer: Medicare HMO | Admitting: General Surgery

## 2017-04-28 ENCOUNTER — Encounter: Payer: Self-pay | Admitting: General Surgery

## 2017-04-28 VITALS — BP 124/73 | HR 76 | Resp 12 | Ht 65.0 in | Wt 143.0 lb

## 2017-04-28 DIAGNOSIS — Z85038 Personal history of other malignant neoplasm of large intestine: Secondary | ICD-10-CM

## 2017-04-28 DIAGNOSIS — Z1231 Encounter for screening mammogram for malignant neoplasm of breast: Secondary | ICD-10-CM

## 2017-04-28 DIAGNOSIS — Z1239 Encounter for other screening for malignant neoplasm of breast: Secondary | ICD-10-CM

## 2017-04-28 NOTE — Progress Notes (Signed)
Patient ID: Lisa Caldwell, female   DOB: 1946/01/19, 71 y.o.   MRN: [id number]  Chief Complaint  Patient presents with  . Follow-up    HPI Lisa Caldwell is a 71 y.o. female who presents for a breast evaluation. The most recent mammogram was done on 02/22/2017.  Patient does perform regular self breast checks and gets regular mammograms done.    HPI  Past Medical History:  Diagnosis Date  . Allergy   . Anxiety   . Cancer [facility])    colon cancer  . Depression   . GERD (gastroesophageal reflux disease)   . Headache(784.0)   . Inflammatory polyps of colon with rectal bleeding ([place]) 2008    rt colectomy for a polyp with carcinoma in situ   . Personal history of malignant neoplasm of large intestine     Past Surgical History:  Procedure Laterality Date  . ABDOMINAL HYSTERECTOMY  1996  . APPENDECTOMY    . COLECTOMY Right 2008  . COLONOSCOPY  2012   Dr. Jamal Collin  . COLONOSCOPY WITH PROPOFOL N/A 03/10/2016   Procedure: COLONOSCOPY WITH PROPOFOL;  Surgeon: [name], MD;  Location: [facility] ENDOSCOPY;  Service: Endoscopy;  Laterality: N/A;  . FOOT SURGERY Left 2012  . FOOT SURGERY Left 2013  . THYROGLOSSAL DUCT CYST N/A 09/04/2015   Procedure: THYROGLOSSAL DUCT CYST;  Surgeon: Carloyn Manner, MD;  Location: [facility] ORS;  Service: ENT;  Laterality: N/A;    Family History  Problem Relation Age of Onset  . Heart disease Mother   . Heart disease Father   . Drug abuse Brother   . Breast cancer Neg Hx     Social History Social History  Substance Use Topics  . Smoking status: Never Smoker  . Smokeless tobacco: Never Used  . Alcohol use 6.0 oz/week    10 Cans of beer per week     Comment: beer    Allergies  Allergen Reactions  . Codeine Nausea And Vomiting  . Peanut-Containing Drug Products Diarrhea and Nausea And Vomiting    Pine nut  . Phenergan [Promethazine Hcl] Other (See Comments)    tremors    Current Outpatient Prescriptions  Medication Sig Dispense Refill   . aspirin 81 MG tablet Take 81 mg by mouth daily.    . Cholecalciferol (VITAMIN D3 PO) Take by mouth.    . citalopram (CELEXA) 20 MG tablet TAKE 1 TABLET EVERY DAY 90 tablet 3  . fluticasone (FLONASE) 50 MCG/ACT nasal spray USE 2 SPRAYS IN EACH NOSTRIL EVERY DAY (SUBSTITUTED FOR FLONASE) 48 g 3  . Multiple Vitamin (MULTIVITAMIN) tablet Take 1 tablet by mouth daily.    Marland Kitchen omeprazole (PRILOSEC) 20 MG capsule TAKE 1 CAPSULE TWICE DAILY BEFORE A MEAL 180 capsule 1   No current facility-administered medications for this visit.     Review of Systems Review of Systems  Constitutional: Negative.   Respiratory: Negative.   Cardiovascular: Negative.     Blood pressure 124/73, pulse 76, resp. rate 12, height 5\' 5"  (1.651 m), weight 143 lb (64.9 kg).  Physical Exam Physical Exam  Constitutional: She is oriented to person, place, and time. She appears well-developed and well-nourished.  Eyes: Conjunctivae are normal. No scleral icterus.  Neck: Neck supple.  Cardiovascular: Normal rate, regular rhythm and normal heart sounds.   Pulmonary/Chest: Effort normal and breath sounds normal. Right breast exhibits no inverted nipple, no mass, no nipple discharge, no skin change and no tenderness. Left breast exhibits no inverted nipple, no  mass, no nipple discharge, no skin change and no tenderness.  Abdominal: Soft. Bowel sounds are normal. There is no tenderness.  Lymphadenopathy:    She has no cervical adenopathy.    She has no axillary adenopathy.  Neurological: She is alert and oriented to person, place, and time.  Skin: Skin is warm and dry.    Data Reviewed Mammogram reviewed   Assessment      History of colon polyps. Stable exam.  Plan    Patient to return to her PCP for mammogram and breast checks. The patient is aware to call back for any questions or concerns. Patient to see Dr. Bary Castilla in 2022 for colonoscopy.     HPI, Physical Exam, Assessment and Plan have been scribed under  the direction and in the presence of Mckinley Jewel, MD  Gaspar Cola, CMA  I have completed the exam and reviewed the above documentation for accuracy and completeness.  I agree with the above.  Haematologist has been used and any errors in dictation or transcription are unintentional.  Khaleesi Gruel G. Jamal Collin, M.D., F.A.C.S.    Junie Panning G 04/29/2017, 9:37 AM

## 2017-04-28 NOTE — Patient Instructions (Addendum)
Patient to return to her PCP for mammogram and breast checks. The patient is aware to call back for any questions or concerns.Patient to see Dr. Bary Castilla in 2022 for colonoscopy.

## 2017-05-12 ENCOUNTER — Ambulatory Visit (INDEPENDENT_AMBULATORY_CARE_PROVIDER_SITE_OTHER): Payer: Medicare HMO

## 2017-05-12 VITALS — BP 122/72 | HR 88 | Temp 98.2°F | Ht 65.0 in | Wt 144.8 lb

## 2017-05-12 DIAGNOSIS — Z0000 Encounter for general adult medical examination without abnormal findings: Secondary | ICD-10-CM

## 2017-05-12 NOTE — Patient Instructions (Signed)
[name] , Thank you for taking time to come for your Medicare Wellness Visit. I appreciate your ongoing commitment to your health goals. Please review the following plan we discussed and let me know if I can assist you in the future.   Screening recommendations/referrals: Colonoscopy: completed 03/10/16, due 02/2021 Mammogram: completed 02/10/17, due 01/2018 Bone Density: completed 04/05/16 Recommended yearly ophthalmology/optometry visit for glaucoma screening and checkup Recommended yearly dental visit for hygiene and checkup  Vaccinations: Influenza vaccine: due 06/2017 Pneumococcal vaccine: completed series Tdap vaccine: declined Shingles vaccine: completed 08/17/11  Advanced directives: Advance directive discussed with you today. Even though you declined this today please call our office should you change your mind and we can give you the proper paperwork for you to fill out.  Conditions/risks identified: Recommend decreasing alcohol consumption to 1 serving a day.   Next appointment: 05/16/17 @ 10:00 AM   Preventive Care 65 Years and Older, Female Preventive care refers to lifestyle choices and visits with your health care provider that can promote health and wellness. What does preventive care include?  A yearly physical exam. This is also called an annual well check.  Dental exams once or twice a year.  Routine eye exams. Ask your health care provider how often you should have your eyes checked.  Personal lifestyle choices, including:  Daily care of your teeth and gums.  Regular physical activity.  Eating a healthy diet.  Avoiding tobacco and drug use.  Limiting alcohol use.  Practicing safe sex.  Taking low-dose aspirin every day.  Taking vitamin and mineral supplements as recommended by your health care provider. What happens during an annual well check? The services and screenings done by your health care provider during your annual well check will  depend on your age, overall health, lifestyle risk factors, and family history of disease. Counseling  Your health care provider may ask you questions about your:  Alcohol use.  Tobacco use.  Drug use.  Emotional well-being.  Home and relationship well-being.  Sexual activity.  Eating habits.  History of falls.  Memory and ability to understand (cognition).  Work and work Statistician.  Reproductive health. Screening  You may have the following tests or measurements:  Height, weight, and BMI.  Blood pressure.  Lipid and cholesterol levels. These may be checked every 5 years, or more frequently if you are over 85 years old.  Skin check.  Lung cancer screening. You may have this screening every year starting at age 93 if you have a 30-pack-year history of smoking and currently smoke or have quit within the past 15 years.  Fecal occult blood test (FOBT) of the stool. You may have this test every year starting at age 6.  Flexible sigmoidoscopy or colonoscopy. You may have a sigmoidoscopy every 5 years or a colonoscopy every 10 years starting at age 59.  Hepatitis C blood test.  Hepatitis B blood test.  Sexually transmitted disease (STD) testing.  Diabetes screening. This is done by checking your blood sugar (glucose) after you have not eaten for a while (fasting). You may have this done every 1-3 years.  Bone density scan. This is done to screen for osteoporosis. You may have this done starting at age 60.  Mammogram. This may be done every 1-2 years. Talk to your health care provider about how often you should have regular mammograms. Talk with your health care provider about your test results, treatment options, and if necessary, the need for more tests. Vaccines  Your health care provider may recommend certain vaccines, such as:  Influenza vaccine. This is recommended every year.  Tetanus, diphtheria, and acellular pertussis (Tdap, Td) vaccine. You may need a  Td booster every 10 years.  Zoster vaccine. You may need this after age 24.  Pneumococcal 13-valent conjugate (PCV13) vaccine. One dose is recommended after age 20.  Pneumococcal polysaccharide (PPSV23) vaccine. One dose is recommended after age 38. Talk to your health care provider about which screenings and vaccines you need and how often you need them. This information is not intended to replace advice given to you by your health care provider. Make sure you discuss any questions you have with your health care provider. Document Released: 10/31/2015 Document Revised: 06/23/2016 Document Reviewed: 08/05/2015 Elsevier Interactive Patient Education  2017 [place] Prevention in the Home Falls can cause injuries. They can happen to people of all ages. There are many things you can do to make your home safe and to help prevent falls. What can I do on the outside of my home?  Regularly fix the edges of walkways and driveways and fix any cracks.  Remove anything that might make you trip as you walk through a door, such as a raised step or threshold.  Trim any bushes or trees on the path to your home.  Use bright outdoor lighting.  Clear any walking paths of anything that might make someone trip, such as rocks or tools.  Regularly check to see if handrails are loose or broken. Make sure that both sides of any steps have handrails.  Any raised decks and porches should have guardrails on the edges.  Have any leaves, snow, or ice cleared regularly.  Use sand or salt on walking paths during winter.  Clean up any spills in your garage right away. This includes oil or grease spills. What can I do in the bathroom?  Use night lights.  Install grab bars by the toilet and in the tub and shower. Do not use towel bars as grab bars.  Use non-skid mats or decals in the tub or shower.  If you need to sit down in the shower, use a plastic, non-slip stool.  Keep the floor dry. Clean  up any water that spills on the floor as soon as it happens.  Remove soap buildup in the tub or shower regularly.  Attach bath mats securely with double-sided non-slip rug tape.  Do not have throw rugs and other things on the floor that can make you trip. What can I do in the bedroom?  Use night lights.  Make sure that you have a light by your bed that is easy to reach.  Do not use any sheets or blankets that are too big for your bed. They should not hang down onto the floor.  Have a firm chair that has side arms. You can use this for support while you get dressed.  Do not have throw rugs and other things on the floor that can make you trip. What can I do in the kitchen?  Clean up any spills right away.  Avoid walking on wet floors.  Keep items that you use a lot in easy-to-reach places.  If you need to reach something above you, use a strong step stool that has a grab bar.  Keep electrical cords out of the way.  Do not use floor polish or wax that makes floors slippery. If you must use wax, use non-skid floor wax.  Do  not have throw rugs and other things on the floor that can make you trip. What can I do with my stairs?  Do not leave any items on the stairs.  Make sure that there are handrails on both sides of the stairs and use them. Fix handrails that are broken or loose. Make sure that handrails are as long as the stairways.  Check any carpeting to make sure that it is firmly attached to the stairs. Fix any carpet that is loose or worn.  Avoid having throw rugs at the top or bottom of the stairs. If you do have throw rugs, attach them to the floor with carpet tape.  Make sure that you have a light switch at the top of the stairs and the bottom of the stairs. If you do not have them, ask someone to add them for you. What else can I do to help prevent falls?  Wear shoes that:  Do not have high heels.  Have rubber bottoms.  Are comfortable and fit you well.  Are  closed at the toe. Do not wear sandals.  If you use a stepladder:  Make sure that it is fully opened. Do not climb a closed stepladder.  Make sure that both sides of the stepladder are locked into place.  Ask someone to hold it for you, if possible.  Clearly mark and make sure that you can see:  Any grab bars or handrails.  First and last steps.  Where the edge of each step is.  Use tools that help you move around (mobility aids) if they are needed. These include:  Canes.  Walkers.  Scooters.  Crutches.  Turn on the lights when you go into a dark area. Replace any light bulbs as soon as they burn out.  Set up your furniture so you have a clear path. Avoid moving your furniture around.  If any of your floors are uneven, fix them.  If there are any pets around you, be aware of where they are.  Review your medicines with your doctor. Some medicines can make you feel dizzy. This can increase your chance of falling. Ask your doctor what other things that you can do to help prevent falls. This information is not intended to replace advice given to you by your health care provider. Make sure you discuss any questions you have with your health care provider. Document Released: 07/31/2009 Document Revised: 03/11/2016 Document Reviewed: 11/08/2014 Elsevier Interactive Patient Education  2017 [employer].

## 2017-05-12 NOTE — Progress Notes (Signed)
Subjective:   Lisa Caldwell is a 71 y.o. female who presents for Medicare Annual (Subsequent) preventive examination.  Review of Systems:  N/A  Cardiac Risk Factors include: advanced age (>12men, >48 women);sedentary lifestyle     Objective:     Vitals: BP 122/72 (BP Location: Left Arm)   Pulse 88   Temp 98.2 F (36.8 C) (Oral)   Ht 5\' 5"  (1.651 m)   Wt 144 lb 12.8 oz (65.7 kg)   BMI 24.10 kg/m   Body mass index is 24.1 kg/m.   Tobacco History  Smoking Status  . Never Smoker  Smokeless Tobacco  . Never Used     Counseling given: Not Answered   Past Medical History:  Diagnosis Date  . Allergy   . Anxiety   . Cancer [facility])    colon cancer  . Depression   . GERD (gastroesophageal reflux disease)   . Headache(784.0)   . Inflammatory polyps of colon with rectal bleeding ([place]) 2008    rt colectomy for a polyp with carcinoma in situ   . Personal history of malignant neoplasm of large intestine    Past Surgical History:  Procedure Laterality Date  . ABDOMINAL HYSTERECTOMY  1996  . APPENDECTOMY    . COLECTOMY Right 2008  . COLONOSCOPY  2012   Dr. Jamal Collin  . COLONOSCOPY WITH PROPOFOL N/A 03/10/2016   Procedure: COLONOSCOPY WITH PROPOFOL;  Surgeon: [name], MD;  Location: [facility] ENDOSCOPY;  Service: Endoscopy;  Laterality: N/A;  . FOOT SURGERY Left 2012  . FOOT SURGERY Left 2013  . THYROGLOSSAL DUCT CYST N/A 09/04/2015   Procedure: THYROGLOSSAL DUCT CYST;  Surgeon: Carloyn Manner, MD;  Location: [facility] ORS;  Service: ENT;  Laterality: N/A;   Family History  Problem Relation Age of Onset  . Heart disease Mother   . Heart disease Father   . Drug abuse Brother   . Breast cancer Neg Hx    History  Sexual Activity  . Sexual activity: No    Comment: hysterectomy    Outpatient Encounter Prescriptions as of 05/12/2017  Medication Sig  . aspirin 81 MG tablet Take 81 mg by mouth daily.  . Cholecalciferol (VITAMIN D3 PO) Take by mouth.  .  citalopram (CELEXA) 20 MG tablet TAKE 1 TABLET EVERY DAY  . fluticasone (FLONASE) 50 MCG/ACT nasal spray USE 2 SPRAYS IN EACH NOSTRIL EVERY DAY (SUBSTITUTED FOR FLONASE)  . Multiple Vitamin (MULTIVITAMIN) tablet Take 1 tablet by mouth daily.  Marland Kitchen omeprazole (PRILOSEC) 20 MG capsule TAKE 1 CAPSULE TWICE DAILY BEFORE A MEAL (Patient taking differently: TAKE 1 CAPSULE Once DAILY BEFORE A MEAL)   No facility-administered encounter medications on file as of 05/12/2017.     Activities of Daily Living In your present state of health, do you have any difficulty performing the following activities: 05/12/2017 05/13/2016  Hearing? N N  Vision? N N  Difficulty concentrating or making decisions? N N  Walking or climbing stairs? N N  Dressing or bathing? N N  Doing errands, shopping? N N  Preparing Food and eating ? N -  Using the Toilet? N -  In the past six months, have you accidently leaked urine? N -  Do you have problems with loss of bowel control? N -  Managing your Medications? N -  Managing your Finances? N -  Housekeeping or managing your Housekeeping? N -  Some recent data might be hidden    Patient Care Team: Mar Daring, PA-C as  PCP - General (Family Medicine)    Assessment:     Exercise Activities and Dietary recommendations Current Exercise Habits: Structured exercise class, Type of exercise: strength training/weights;Other - see comments (tyche class), Time (Minutes): > 60, Frequency (Times/Week): 5, Weekly Exercise (Minutes/Week): 0, Intensity: Intense, Exercise limited by: None identified  Goals    . Exercise 150 minutes per week (moderate activity)    . Reduce alcohol intake to 1 serving a day.       Fall Risk Fall Risk  05/12/2017 05/13/2016 05/05/2015  Falls in the past year? No No No   Depression Screen PHQ 2/9 Scores 05/12/2017 05/13/2016 05/05/2015  PHQ - 2 Score 0 0 0     Cognitive Function     6CIT Screen 05/12/2017  What Year? 0 points  What month? 0  points  What time? 0 points  Count back from 20 0 points  Months in reverse 0 points  Repeat phrase 0 points  Total Score 0    Immunization History  Administered Date(s) Administered  . H1N1 09/25/2008  . Influenza Split 07/16/2010, 07/28/2011, 08/08/2012  . Influenza, High Dose Seasonal PF 07/16/2014, 07/19/2015, 07/09/2016  . Influenza,inj,Quad PF,36+ Mos 07/12/2013  . Pneumococcal Conjugate-13 05/05/2015  . Pneumococcal Polysaccharide-23 08/27/2011  . Tdap 01/11/2006  . Zoster 08/17/2011   Screening Tests Health Maintenance  Topic Date Due  . TETANUS/TDAP  10/18/2026 (Originally 01/12/2016)  . INFLUENZA VACCINE  05/18/2017  . MAMMOGRAM  02/11/2019  . COLONOSCOPY  03/11/2023  . DEXA SCAN  Completed  . Hepatitis C Screening  Completed  . PNA vac Low Risk Adult  Completed      Plan:  I have personally reviewed and addressed the Medicare Annual Wellness questionnaire and have noted the following in the patient's chart:  A. Medical and social history B. Use of alcohol, tobacco or illicit drugs  C. Current medications and supplements D. Functional ability and status E.  Nutritional status F.  Physical activity G. Advance directives H. List of other physicians I.  Hospitalizations, surgeries, and ER visits in previous 12 months J.  [place] such as hearing and vision if needed, cognitive and depression L. Referrals and appointments - none  In addition, I have reviewed and discussed with patient certain preventive protocols, quality metrics, and best practice recommendations. A written personalized care plan for preventive services as well as general preventive health recommendations were provided to patient.  See attached scanned questionnaire for additional information.   Signed,  Fabio Neighbors, LPN Nurse Health Advisor   MD Recommendations: None.

## 2017-05-16 ENCOUNTER — Ambulatory Visit (INDEPENDENT_AMBULATORY_CARE_PROVIDER_SITE_OTHER): Payer: Medicare HMO | Admitting: Physician Assistant

## 2017-05-16 VITALS — BP 130/80 | HR 84 | Temp 98.0°F | Resp 16 | Ht 65.0 in | Wt 143.8 lb

## 2017-05-16 DIAGNOSIS — F4322 Adjustment disorder with anxiety: Secondary | ICD-10-CM

## 2017-05-16 DIAGNOSIS — E875 Hyperkalemia: Secondary | ICD-10-CM

## 2017-05-16 DIAGNOSIS — F1010 Alcohol abuse, uncomplicated: Secondary | ICD-10-CM

## 2017-05-16 DIAGNOSIS — Z0000 Encounter for general adult medical examination without abnormal findings: Secondary | ICD-10-CM

## 2017-05-16 DIAGNOSIS — E7800 Pure hypercholesterolemia, unspecified: Secondary | ICD-10-CM | POA: Diagnosis not present

## 2017-05-16 DIAGNOSIS — R739 Hyperglycemia, unspecified: Secondary | ICD-10-CM | POA: Diagnosis not present

## 2017-05-16 DIAGNOSIS — Z23 Encounter for immunization: Secondary | ICD-10-CM | POA: Diagnosis not present

## 2017-05-16 NOTE — Patient Instructions (Signed)
Health Maintenance for Postmenopausal Women Menopause is a normal process in which your reproductive ability comes to an end. This process happens gradually over a span of months to years, usually between the ages of 22 and 9. Menopause is complete when you have missed 12 consecutive menstrual periods. It is important to talk with your health care provider about some of the most common conditions that affect postmenopausal women, such as heart disease, cancer, and bone loss (osteoporosis). Adopting a healthy lifestyle and getting preventive care can help to promote your health and wellness. Those actions can also lower your chances of developing some of these common conditions. What should I know about menopause? During menopause, you may experience a number of symptoms, such as:  Moderate-to-severe hot flashes.  Night sweats.  Decrease in sex drive.  Mood swings.  Headaches.  Tiredness.  Irritability.  Memory problems.  Insomnia.  Choosing to treat or not to treat menopausal changes is an individual decision that you make with your health care provider. What should I know about hormone replacement therapy and supplements? Hormone therapy products are effective for treating symptoms that are associated with menopause, such as hot flashes and night sweats. Hormone replacement carries certain risks, especially as you become older. If you are thinking about using estrogen or estrogen with progestin treatments, discuss the benefits and risks with your health care provider. What should I know about heart disease and stroke? Heart disease, heart attack, and stroke become more likely as you age. This may be due, in part, to the hormonal changes that your body experiences during menopause. These can affect how your body processes dietary fats, triglycerides, and cholesterol. Heart attack and stroke are both medical emergencies. There are many things that you can do to help prevent heart disease  and stroke:  Have your blood pressure checked at least every 1-2 years. High blood pressure causes heart disease and increases the risk of stroke.  If you are 53-22 years old, ask your health care provider if you should take aspirin to prevent a heart attack or a stroke.  Do not use any tobacco products, including cigarettes, chewing tobacco, or electronic cigarettes. If you need help quitting, ask your health care provider.  It is important to eat a healthy diet and maintain a healthy weight. ? Be sure to include plenty of vegetables, fruits, low-fat dairy products, and lean protein. ? Avoid eating foods that are high in solid fats, added sugars, or salt (sodium).  Get regular exercise. This is one of the most important things that you can do for your health. ? Try to exercise for at least 150 minutes each week. The type of exercise that you do should increase your heart rate and make you sweat. This is known as moderate-intensity exercise. ? Try to do strengthening exercises at least twice each week. Do these in addition to the moderate-intensity exercise.  Know your numbers.Ask your health care provider to check your cholesterol and your blood glucose. Continue to have your blood tested as directed by your health care provider.  What should I know about cancer screening? There are several types of cancer. Take the following steps to reduce your risk and to catch any cancer development as early as possible. Breast Cancer  Practice breast self-awareness. ? This means understanding how your breasts normally appear and feel. ? It also means doing regular breast self-exams. Let your health care provider know about any changes, no matter how small.  If you are 40  or older, have a clinician do a breast exam (clinical breast exam or CBE) every year. Depending on your age, family history, and medical history, it may be recommended that you also have a yearly breast X-ray (mammogram).  If you  have a family history of breast cancer, talk with your health care provider about genetic screening.  If you are at high risk for breast cancer, talk with your health care provider about having an MRI and a mammogram every year.  Breast cancer (BRCA) gene test is recommended for women who have family members with BRCA-related cancers. Results of the assessment will determine the need for genetic counseling and BRCA1 and for BRCA2 testing. BRCA-related cancers include these types: ? Breast. This occurs in males or females. ? Ovarian. ? Tubal. This may also be called fallopian tube cancer. ? Cancer of the abdominal or pelvic lining (peritoneal cancer). ? Prostate. ? Pancreatic.  Cervical, Uterine, and Ovarian Cancer Your health care provider may recommend that you be screened regularly for cancer of the pelvic organs. These include your ovaries, uterus, and vagina. This screening involves a pelvic exam, which includes checking for microscopic changes to the surface of your cervix (Pap test).  For women ages 21-65, health care providers may recommend a pelvic exam and a Pap test every three years. For women ages 79-65, they may recommend the Pap test and pelvic exam, combined with testing for human papilloma virus (HPV), every five years. Some types of HPV increase your risk of cervical cancer. Testing for HPV may also be done on women of any age who have unclear Pap test results.  Other health care providers may not recommend any screening for nonpregnant women who are considered low risk for pelvic cancer and have no symptoms. Ask your health care provider if a screening pelvic exam is right for you.  If you have had past treatment for cervical cancer or a condition that could lead to cancer, you need Pap tests and screening for cancer for at least 20 years after your treatment. If Pap tests have been discontinued for you, your risk factors (such as having a new sexual partner) need to be  reassessed to determine if you should start having screenings again. Some women have medical problems that increase the chance of getting cervical cancer. In these cases, your health care provider may recommend that you have screening and Pap tests more often.  If you have a family history of uterine cancer or ovarian cancer, talk with your health care provider about genetic screening.  If you have vaginal bleeding after reaching menopause, tell your health care provider.  There are currently no reliable tests available to screen for ovarian cancer.  Lung Cancer Lung cancer screening is recommended for adults 69-62 years old who are at high risk for lung cancer because of a history of smoking. A yearly low-dose CT scan of the lungs is recommended if you:  Currently smoke.  Have a history of at least 30 pack-years of smoking and you currently smoke or have quit within the past 15 years. A pack-year is smoking an average of one pack of cigarettes per day for one year.  Yearly screening should:  Continue until it has been 15 years since you quit.  Stop if you develop a health problem that would prevent you from having lung cancer treatment.  Colorectal Cancer  This type of cancer can be detected and can often be prevented.  Routine colorectal cancer screening usually begins at  age 42 and continues through age 45.  If you have risk factors for colon cancer, your health care provider may recommend that you be screened at an earlier age.  If you have a family history of colorectal cancer, talk with your health care provider about genetic screening.  Your health care provider may also recommend using home test kits to check for hidden blood in your stool.  A small camera at the end of a tube can be used to examine your colon directly (sigmoidoscopy or colonoscopy). This is done to check for the earliest forms of colorectal cancer.  Direct examination of the colon should be repeated every  5-10 years until age 71. However, if early forms of precancerous polyps or small growths are found or if you have a family history or genetic risk for colorectal cancer, you may need to be screened more often.  Skin Cancer  Check your skin from head to toe regularly.  Monitor any moles. Be sure to tell your health care provider: ? About any new moles or changes in moles, especially if there is a change in a mole's shape or color. ? If you have a mole that is larger than the size of a pencil eraser.  If any of your family members has a history of skin cancer, especially at a young age, talk with your health care provider about genetic screening.  Always use sunscreen. Apply sunscreen liberally and repeatedly throughout the day.  Whenever you are outside, protect yourself by wearing long sleeves, pants, a wide-brimmed hat, and sunglasses.  What should I know about osteoporosis? Osteoporosis is a condition in which bone destruction happens more quickly than new bone creation. After menopause, you may be at an increased risk for osteoporosis. To help prevent osteoporosis or the bone fractures that can happen because of osteoporosis, the following is recommended:  If you are 46-71 years old, get at least 1,000 mg of calcium and at least 600 mg of vitamin D per day.  If you are older than age 55 but younger than age 65, get at least 1,200 mg of calcium and at least 600 mg of vitamin D per day.  If you are older than age 54, get at least 1,200 mg of calcium and at least 800 mg of vitamin D per day.  Smoking and excessive alcohol intake increase the risk of osteoporosis. Eat foods that are rich in calcium and vitamin D, and do weight-bearing exercises several times each week as directed by your health care provider. What should I know about how menopause affects my mental health? Depression may occur at any age, but it is more common as you become older. Common symptoms of depression  include:  Low or sad mood.  Changes in sleep patterns.  Changes in appetite or eating patterns.  Feeling an overall lack of motivation or enjoyment of activities that you previously enjoyed.  Frequent crying spells.  Talk with your health care provider if you think that you are experiencing depression. What should I know about immunizations? It is important that you get and maintain your immunizations. These include:  Tetanus, diphtheria, and pertussis (Tdap) booster vaccine.  Influenza every year before the flu season begins.  Pneumonia vaccine.  Shingles vaccine.  Your health care provider may also recommend other immunizations. This information is not intended to replace advice given to you by your health care provider. Make sure you discuss any questions you have with your health care provider. Document Released: 11/26/2005  Document Revised: 04/23/2016 Document Reviewed: 07/08/2015 Elsevier Interactive Patient Education  2018 Elsevier Inc.  

## 2017-05-16 NOTE — Progress Notes (Signed)
Patient: Lisa Caldwell, Female    DOB: 03-12-1946, 71 y.o.   MRN: [id number] Visit Date: 05/16/2017  Today's Provider: Mar Daring, PA-C   Chief Complaint  Patient presents with  . Annual Exam   Subjective:    Annual physical exam Lisa Caldwell is a 71 y.o. female who presents today for health maintenance and complete physical. She feels well. She reports exercising daily. She reports she is sleeping well. 05/16/16 CPE 02/12/17 Mammogram-BI-RADS 1 04/05/16 BMD-Osteopenia 03/10/16 Colonoscopy-recheck in 5 years -----------------------------------------------------------------   Review of Systems  Constitutional: Negative.   HENT: Negative.   Eyes: Negative.   Respiratory: Negative.   Cardiovascular: Negative.   Gastrointestinal: Negative.   Endocrine: Negative.   Genitourinary: Negative.   Musculoskeletal: Negative.   Skin: Negative.   Allergic/Immunologic: Negative.   Neurological: Negative.   Hematological: Negative.   Psychiatric/Behavioral: Negative.     Social History      She  reports that she has never smoked. She has never used smokeless tobacco. She reports that she drinks about 6.0 oz of alcohol per week . She reports that she does not use drugs.       Social History   Social History  . Marital status: Married    Spouse name: Gershon Mussel  . Number of children: 2  . Years of education: N/A   Occupational History  . Retired    Social History Main Topics  . Smoking status: Never Smoker  . Smokeless tobacco: Never Used  . Alcohol use 6.0 oz/week    10 Cans of beer per week     Comment: beer  . Drug use: No  . Sexual activity: No     Comment: hysterectomy   Other Topics Concern  . Not on file   Social History Narrative  . No narrative on file    Past Medical History:  Diagnosis Date  . Allergy   . Anxiety   . Cancer [facility])    colon cancer  . Depression   . GERD (gastroesophageal reflux disease)   . Headache(784.0)   .  Inflammatory polyps of colon with rectal bleeding ([place]) 2008    rt colectomy for a polyp with carcinoma in situ   . Personal history of malignant neoplasm of large intestine      Patient Active Problem List   Diagnosis Date Noted  . Alcohol abuse 05/16/2016  . Osteopenia 04/06/2016  . History of thyroglossal duct cyst removal 09/04/2015  . Persistent thyroglossal duct cyst 07/25/2015  . Adaptation reaction 03/04/2015  . Screening for diabetes mellitus 03/04/2015  . Colon, diverticulosis 03/04/2015  . Elevated blood sugar 03/04/2015  . Acid reflux 03/04/2015  . High potassium 03/04/2015  . Hammer toe 03/04/2015  . Headache, migraine 03/04/2015  . Allergic rhinitis 04/03/2013  . Clinical depression 04/03/2013  . History of colon cancer 01/31/2013    Past Surgical History:  Procedure Laterality Date  . ABDOMINAL HYSTERECTOMY  1996  . APPENDECTOMY    . COLECTOMY Right 2008  . COLONOSCOPY  2012   Dr. Jamal Collin  . COLONOSCOPY WITH PROPOFOL N/A 03/10/2016   Procedure: COLONOSCOPY WITH PROPOFOL;  Surgeon: [name], MD;  Location: [facility] ENDOSCOPY;  Service: Endoscopy;  Laterality: N/A;  . FOOT SURGERY Left 2012  . FOOT SURGERY Left 2013  . THYROGLOSSAL DUCT CYST N/A 09/04/2015   Procedure: THYROGLOSSAL DUCT CYST;  Surgeon: Carloyn Manner, MD;  Location: [facility] ORS;  Service: ENT;  Laterality: N/A;  Family History        Family Status  Relation Status  . Mother Deceased at age 14  . Father Other  . Brother Deceased at age 28       heart disease  . Neg Hx (Not Specified)        Her family history includes Drug abuse in her brother; Heart disease in her father and mother.     Allergies  Allergen Reactions  . Codeine Nausea And Vomiting  . Peanut-Containing Drug Products Diarrhea and Nausea And Vomiting    Pine nut  . Phenergan [Promethazine Hcl] Other (See Comments)    tremors     Current Outpatient Prescriptions:  .  aspirin 81 MG tablet, Take 81 mg by  mouth daily., Disp: , Rfl:  .  Cholecalciferol (VITAMIN D3 PO), Take by mouth., Disp: , Rfl:  .  citalopram (CELEXA) 20 MG tablet, TAKE 1 TABLET EVERY DAY, Disp: 90 tablet, Rfl: 3 .  fluticasone (FLONASE) 50 MCG/ACT nasal spray, USE 2 SPRAYS IN EACH NOSTRIL EVERY DAY (SUBSTITUTED FOR FLONASE), Disp: 48 g, Rfl: 3 .  Multiple Vitamin (MULTIVITAMIN) tablet, Take 1 tablet by mouth daily., Disp: , Rfl:  .  omeprazole (PRILOSEC) 20 MG capsule, TAKE 1 CAPSULE TWICE DAILY BEFORE A MEAL (Patient taking differently: TAKE 1 CAPSULE Once DAILY BEFORE A MEAL), Disp: 180 capsule, Rfl: 1   Patient Care Team: Mar Daring, PA-C as PCP - General (Family Medicine)      Objective:   Vitals: BP 130/80 (BP Location: Left Arm, Patient Position: Sitting, Cuff Size: Large)   Pulse 84   Temp 98 F (36.7 C) (Oral)   Resp 16   Ht 5\' 5"  (1.651 m)   Wt 143 lb 12.8 oz (65.2 kg)   BMI 23.93 kg/m    Vitals:   05/16/17 1002  BP: 130/80  Pulse: 84  Resp: 16  Temp: 98 F (36.7 C)  TempSrc: Oral  Weight: 143 lb 12.8 oz (65.2 kg)  Height: 5\' 5"  (1.651 m)     Physical Exam  Constitutional: She is oriented to person, place, and time. She appears well-developed and well-nourished. No distress.  HENT:  Head: Normocephalic and atraumatic.  Right Ear: Hearing, tympanic membrane, external ear and ear canal normal.  Left Ear: Hearing, tympanic membrane, external ear and ear canal normal.  Nose: Nose normal.  Mouth/Throat: Uvula is midline, oropharynx is clear and moist and mucous membranes are normal. No oropharyngeal exudate.  Eyes: Pupils are equal, round, and reactive to light. Conjunctivae and EOM are normal. Right eye exhibits no discharge. Left eye exhibits no discharge. No scleral icterus.  Neck: Normal range of motion. Neck supple. No JVD present. Carotid bruit is not present. No tracheal deviation present. No thyromegaly present.  Cardiovascular: Normal rate, regular rhythm, normal heart sounds and  intact distal pulses.  Exam reveals no gallop and no friction rub.   No murmur heard. Pulmonary/Chest: Effort normal and breath sounds normal. No respiratory distress. She has no wheezes. She has no rales. She exhibits no tenderness.  Abdominal: Soft. Bowel sounds are normal. She exhibits no distension and no mass. There is no tenderness. There is no rebound and no guarding.  Musculoskeletal: Normal range of motion. She exhibits no edema or tenderness.  Lymphadenopathy:    She has no cervical adenopathy.  Neurological: She is alert and oriented to person, place, and time.  Skin: Skin is warm and dry. No rash noted. She is not diaphoretic.  Psychiatric: She has a normal mood and affect. Her behavior is normal. Judgment and thought content normal.  Vitals reviewed.       Depression Screen PHQ 2/9 Scores 05/16/2017 05/12/2017 05/13/2016 05/05/2015  PHQ - 2 Score 0 0 0 0  PHQ- 9 Score 0 - - -   Home Exercise  05/12/2017  Current Exercise Habits Structured exercise class  Type of exercise strength training/weights;Other - see comments  Time (Minutes) > 60  Frequency (Times/Week) 5  Weekly Exercise (Minutes/Week) 0  Intensity Intense  Exercise limited by: None identified   Fall Risk  05/12/2017 05/13/2016 05/05/2015  Falls in the past year? No No No   Cognitive Testing - 6-CIT  Correct? Score   What year is it? yes 0 0 or 4  What month is it? yes 0 0 or 3  Memorize:    [name],  42,  [address],  [place],      What time is it? (within 1 hour) yes 0 0 or 3  Count backwards from 20 yes 0 0, 2, or 4  Name the months of the year yes 0 0, 2, or 4  Repeat name & address above yes 0 0, 2, 4, 6, 8, or 10       TOTAL SCORE  0/28   Interpretation:  Normal  Normal (0-7) Abnormal (8-28)    Assessment & Plan:     Routine Health Maintenance and Physical Exam  Exercise Activities and Dietary recommendations Goals    . Exercise 150 minutes per week (moderate activity)    . Reduce alcohol  intake to 1 serving a day.        Immunization History  Administered Date(s) Administered  . H1N1 09/25/2008  . Influenza Split 07/16/2010, 07/28/2011, 08/08/2012  . Influenza, High Dose Seasonal PF 07/16/2014, 07/19/2015, 07/09/2016  . Influenza,inj,Quad PF,36+ Mos 07/12/2013  . Pneumococcal Conjugate-13 05/05/2015  . Pneumococcal Polysaccharide-23 08/27/2011  . Tdap 01/11/2006  . Zoster 08/17/2011    Health Maintenance  Topic Date Due  . TETANUS/TDAP  10/18/2026 (Originally 01/12/2016)  . INFLUENZA VACCINE  05/18/2017  . MAMMOGRAM  02/11/2019  . COLONOSCOPY  03/11/2023  . DEXA SCAN  Completed  . Hepatitis C Screening  Completed  . PNA vac Low Risk Adult  Completed     Discussed health benefits of physical activity, and encouraged her to engage in regular exercise appropriate for her age and condition.    1. Annual physical exam Normal physical exam. Patient had mammogram done in 01/2017 (was followed by Dr.Sankar but is going to establish breast care here as well next year) and BMD in 03/2016. Labs ordered as below. Colonoscopy done in 2017.  2. Alcohol abuse Currently active. Reports drinking about 10 beers per week.  Is s/p right colectomy for colon cancer.  - Lipid Panel With LDL/HDL Ratio  3. Adjustment disorder with anxious mood Stable. Patient advised to continue current medication and plan of care.  - TSH  4. Elevated blood sugar Stable. Follow up pending lab results. - Hemoglobin A1c  5. High potassium Follow up pending lab results. - CBC with Differential/Platelet - Comprehensive metabolic panel - Lipid Panel With LDL/HDL Ratio - TSH  6. Need for shingles vaccine Shingrix Vaccine given to patient without complications. Patient sat for 15 minutes after administration and was tolerated well without adverse effects. - Varicella-zoster vaccine IM  --------------------------------------------------------------------  Patient seen and examined by Mar Daring, PA-C, and note scribed by Philbert Riser.  Dimas, CMA.   Mar Daring, PA-C  [facility]

## 2017-05-17 ENCOUNTER — Telehealth: Payer: Self-pay

## 2017-05-17 LAB — CBC WITH DIFFERENTIAL/PLATELET
BASOS: 0 %
Basophils Absolute: 0 10*3/uL (ref 0.0–0.2)
EOS (ABSOLUTE): 0.2 10*3/uL (ref 0.0–0.4)
EOS: 3 %
HEMOGLOBIN: 14.4 g/dL (ref 11.1–15.9)
Hematocrit: 44.1 % (ref 34.0–46.6)
Immature Grans (Abs): 0 10*3/uL (ref 0.0–0.1)
Immature Granulocytes: 0 %
LYMPHS ABS: 1.4 10*3/uL (ref 0.7–3.1)
Lymphs: 21 %
MCH: 34 pg — AB (ref 26.6–33.0)
MCHC: 32.7 g/dL (ref 31.5–35.7)
MCV: 104 fL — AB (ref 79–97)
MONOCYTES: 6 %
Monocytes Absolute: 0.4 10*3/uL (ref 0.1–0.9)
NEUTROS ABS: 4.8 10*3/uL (ref 1.4–7.0)
Neutrophils: 70 %
Platelets: 281 10*3/uL (ref 150–379)
RBC: 4.24 x10E6/uL (ref 3.77–5.28)
RDW: 12.9 % (ref 12.3–15.4)
WBC: 6.8 10*3/uL (ref 3.4–10.8)

## 2017-05-17 LAB — HEMOGLOBIN A1C
Est. average glucose Bld gHb Est-mCnc: 100 mg/dL
HEMOGLOBIN A1C: 5.1 % (ref 4.8–5.6)

## 2017-05-17 LAB — COMPREHENSIVE METABOLIC PANEL
A/G RATIO: 1.5 (ref 1.2–2.2)
ALT: 18 IU/L (ref 0–32)
AST: 24 IU/L (ref 0–40)
Albumin: 4.3 g/dL (ref 3.5–4.8)
Alkaline Phosphatase: 65 IU/L (ref 39–117)
BILIRUBIN TOTAL: 0.5 mg/dL (ref 0.0–1.2)
BUN/Creatinine Ratio: 13 (ref 12–28)
BUN: 9 mg/dL (ref 8–27)
CHLORIDE: 105 mmol/L (ref 96–106)
CO2: 26 mmol/L (ref 20–29)
Calcium: 9.3 mg/dL (ref 8.7–10.3)
Creatinine, Ser: 0.7 mg/dL (ref 0.57–1.00)
GFR calc non Af Amer: 87 mL/min/{1.73_m2} (ref >59)
GFR, EST AFRICAN AMERICAN: 101 mL/min/{1.73_m2} (ref >59)
Globulin, Total: 2.8 g/dL (ref 1.5–4.5)
Glucose: 101 mg/dL — ABNORMAL HIGH (ref 65–99)
POTASSIUM: 4.3 mmol/L (ref 3.5–5.2)
SODIUM: 143 mmol/L (ref 134–144)
TOTAL PROTEIN: 7.1 g/dL (ref 6.0–8.5)

## 2017-05-17 LAB — LIPID PANEL WITH LDL/HDL RATIO
Cholesterol, Total: 196 mg/dL (ref 100–199)
HDL: 73 mg/dL (ref >39)
LDL CALC: 106 mg/dL — AB (ref 0–99)
LDL/HDL RATIO: 1.5 ratio (ref 0.0–3.2)
Triglycerides: 87 mg/dL (ref 0–149)
VLDL Cholesterol Cal: 17 mg/dL (ref 5–40)

## 2017-05-17 LAB — TSH: TSH: 1.66 u[IU]/mL (ref 0.450–4.500)

## 2017-05-17 NOTE — Telephone Encounter (Signed)
-----   Message from Mar Daring, PA-C sent at 05/17/2017  8:18 AM EDT ----- All labs are within normal limits and stable.  Thanks! -JB

## 2017-05-17 NOTE — Telephone Encounter (Signed)
Left message advising pt. OK per DPR. 

## 2017-07-20 ENCOUNTER — Other Ambulatory Visit: Payer: Self-pay | Admitting: Physician Assistant

## 2017-07-20 DIAGNOSIS — F4322 Adjustment disorder with anxiety: Secondary | ICD-10-CM

## 2017-07-20 DIAGNOSIS — J309 Allergic rhinitis, unspecified: Secondary | ICD-10-CM

## 2017-07-20 DIAGNOSIS — K219 Gastro-esophageal reflux disease without esophagitis: Secondary | ICD-10-CM

## 2017-07-21 ENCOUNTER — Ambulatory Visit (INDEPENDENT_AMBULATORY_CARE_PROVIDER_SITE_OTHER): Payer: Medicare HMO | Admitting: Physician Assistant

## 2017-07-21 DIAGNOSIS — Z23 Encounter for immunization: Secondary | ICD-10-CM | POA: Diagnosis not present

## 2017-07-21 NOTE — Progress Notes (Signed)
Nurse Visit. Patient here today for 2nd dose of Shingrix vaccination and Influenza vaccine.Patient reports feeling well. Vaccines given to patient without complications. Patient sat for 15 minutes after administration and was tolerated well without adverse effects.

## 2017-10-26 ENCOUNTER — Telehealth: Payer: Self-pay

## 2017-10-26 NOTE — Telephone Encounter (Signed)
Spoke with Tamika and gave [place] number of the Shingles Vaccine that was given on 05/16/17.  Thanks,  -Joseline

## 2017-10-26 NOTE — Telephone Encounter (Signed)
Josie please help

## 2017-10-26 NOTE — Telephone Encounter (Signed)
Copied from [place] [id number]. Topic: Inquiry >> Oct 26, 2017 10:44 AM Vernona Rieger wrote: Remo Lipps from [employer] wants to know what the [facility] number is for the shingles vaccine that she received on 05/16/17 because she is seeking reimbursement   Call back is [phone] reference number is [id number]

## 2018-01-19 ENCOUNTER — Other Ambulatory Visit: Payer: Self-pay | Admitting: Physician Assistant

## 2018-01-19 DIAGNOSIS — Z1231 Encounter for screening mammogram for malignant neoplasm of breast: Secondary | ICD-10-CM

## 2018-02-13 ENCOUNTER — Ambulatory Visit
Admission: RE | Admit: 2018-02-13 | Discharge: 2018-02-13 | Disposition: A | Discharge status: Home / Self Care | Payer: Medicare HMO | Source: Ambulatory Visit | Attending: Physician Assistant | Admitting: Physician Assistant

## 2018-02-13 DIAGNOSIS — Z1231 Encounter for screening mammogram for malignant neoplasm of breast: Secondary | ICD-10-CM | POA: Diagnosis not present

## 2018-02-13 DIAGNOSIS — R921 Mammographic calcification found on diagnostic imaging of breast: Secondary | ICD-10-CM | POA: Diagnosis not present

## 2018-02-14 ENCOUNTER — Other Ambulatory Visit: Payer: Self-pay | Admitting: Physician Assistant

## 2018-02-14 DIAGNOSIS — R921 Mammographic calcification found on diagnostic imaging of breast: Secondary | ICD-10-CM

## 2018-02-14 DIAGNOSIS — R928 Other abnormal and inconclusive findings on diagnostic imaging of breast: Secondary | ICD-10-CM

## 2018-02-27 ENCOUNTER — Ambulatory Visit
Admission: RE | Admit: 2018-02-27 | Discharge: 2018-02-27 | Disposition: A | Discharge status: Home / Self Care | Payer: Medicare HMO | Source: Ambulatory Visit | Attending: Physician Assistant | Admitting: Physician Assistant

## 2018-02-27 DIAGNOSIS — R920 Mammographic microcalcification found on diagnostic imaging of breast: Secondary | ICD-10-CM | POA: Diagnosis not present

## 2018-02-27 DIAGNOSIS — R928 Other abnormal and inconclusive findings on diagnostic imaging of breast: Secondary | ICD-10-CM | POA: Insufficient documentation

## 2018-02-27 DIAGNOSIS — R921 Mammographic calcification found on diagnostic imaging of breast: Secondary | ICD-10-CM | POA: Insufficient documentation

## 2018-02-28 ENCOUNTER — Telehealth: Payer: Self-pay

## 2018-02-28 NOTE — Telephone Encounter (Signed)
Viewed by [name] on 02/27/2018 4:49 PM

## 2018-02-28 NOTE — Telephone Encounter (Signed)
-----   Message from Mar Daring, [place] sent at 02/27/2018  2:39 PM EDT ----- Recommend a short 6 month interval mammogram of left breast for possible breast calcifications to monitor for changes.

## 2018-04-14 ENCOUNTER — Telehealth: Payer: Self-pay

## 2018-04-14 NOTE — Telephone Encounter (Signed)
Patient called requesting to schedule an appointment. She complains of numbness under her right eye, around the upper right cheek. This started last night and is unchanged today. Patient denies any chest pain, palpitations, slurred speech, difficulty speaking, difficulty swallowing, headache, eye pain,  Swelling, lightheadedness, blurred vision, or radiating pain down her arms. She states she feels fine other that the slight numbness in her cheek. She describes the numbness as the sensation you feel after going to the dentist, then having the numbing medication wear off. I consulted with Fenton Malling PA-C who advised me to counsel patient on symptoms to watch  for (listed above), which warrant ER evaluation. Patient verbally voiced understanding. Appointment scheduled for 04/17/2018 at 1:20pm.

## 2018-04-17 ENCOUNTER — Ambulatory Visit (INDEPENDENT_AMBULATORY_CARE_PROVIDER_SITE_OTHER): Payer: Medicare HMO | Admitting: Physician Assistant

## 2018-04-17 ENCOUNTER — Encounter: Payer: Self-pay | Admitting: Physician Assistant

## 2018-04-17 VITALS — BP 102/62 | HR 78 | Temp 98.2°F | Resp 16 | Wt 146.0 lb

## 2018-04-17 DIAGNOSIS — R200 Anesthesia of skin: Secondary | ICD-10-CM | POA: Diagnosis not present

## 2018-04-17 MED ORDER — PREDNISONE 10 MG (21) PO TBPK: ORAL_TABLET | ORAL | 0 refills | Status: DC

## 2018-04-17 NOTE — Progress Notes (Signed)
Patient: Lisa Caldwell Female    DOB: 03/24/1946   72 y.o.   MRN: [id number] Visit Date: 04/17/2018  Today's Provider: Mar Daring, PA-C   Chief Complaint  Patient presents with  . Numbness   Subjective:    HPI Patient here today C/O numbness under right eye since Thursday. Patient denies any headache, abnormal vision or any changes with numbness.   Raeden reports that approx 4 weeks ago a friend of her husband's gave them "some liquid to try for aches and pains that is working on being FDA approved." She reports it was in a liquid dropper bottle and you put 5-6 drops on a piece of toast and then eat the toast. She is unsure exactly what it was. Following using one dose, the next morning she awoke with a terrible migraine. States she has not had a migraine in years. The following day after the migraine she developed severe nausea. She reports she was unable to keep eat much during this time and was dry-heaving a lot. This all started on a Thursday night about 3 weeks ago. She was going to call to be seen if symptoms did not improve as she was sick all weekend but by Monday had started to feel a little better. It took almost all last week to start feeling better and slowly increase appetite but then Thursday of last week noticed the area of numbness. She went and looked up signs of stroke and Bell's palsy. She had no other symptoms, but she called and scheduled today's appt for further evaluation.     Allergies  Allergen Reactions  . Codeine Nausea And Vomiting  . Peanut-Containing Drug Products Diarrhea and Nausea And Vomiting    Pine nut  . Phenergan [Promethazine Hcl] Other (See Comments)    tremors     Current Outpatient Medications:  .  aspirin 81 MG tablet, Take 81 mg by mouth daily., Disp: , Rfl:  .  Cholecalciferol (VITAMIN D3 PO), Take by mouth., Disp: , Rfl:  .  citalopram (CELEXA) 20 MG tablet, TAKE 1 TABLET EVERY DAY, Disp: 90 tablet, Rfl: 3 .  fluticasone  (FLONASE) 50 MCG/ACT nasal spray, USE 2 SPRAYS IN EACH NOSTRIL EVERY DAY (SUBSTITUTED FOR FLONASE), Disp: 48 g, Rfl: 3 .  Multiple Vitamin (MULTIVITAMIN) tablet, Take 1 tablet by mouth daily., Disp: , Rfl:  .  omeprazole (PRILOSEC) 20 MG capsule, TAKE 1 CAPSULE TWICE DAILY AS DIRECTED BEFORE A MEAL, Disp: 180 capsule, Rfl: 1  Review of Systems  Constitutional: Negative.   Respiratory: Negative.   Cardiovascular: Negative.   Neurological: Positive for numbness.    Social History   Tobacco Use  . Smoking status: Never Smoker  . Smokeless tobacco: Never Used  Substance Use Topics  . Alcohol use: Yes    Alcohol/week: 6.0 oz    Types: 10 Cans of beer per week    Comment: beer   Objective:   BP 102/62 (BP Location: Left Arm, Patient Position: Sitting, Cuff Size: Normal)   Pulse 78   Temp 98.2 F (36.8 C) (Oral)   Resp 16   Wt 146 lb (66.2 kg)   SpO2 97%   BMI 24.30 kg/m  Vitals:   04/17/18 1316  BP: 102/62  Pulse: 78  Resp: 16  Temp: 98.2 F (36.8 C)  TempSrc: Oral  SpO2: 97%  Weight: 146 lb (66.2 kg)     Physical Exam  Constitutional: She is oriented to person, place,  and time. She appears well-developed and well-nourished. No distress.  HENT:  Head:    Eyes: Pupils are equal, round, and reactive to light. Conjunctivae and EOM are normal. Right eye exhibits no nystagmus. Left eye exhibits no nystagmus.  Neck: Normal range of motion. Neck supple. No tracheal deviation present. No thyromegaly present.  Cardiovascular: Normal rate, regular rhythm and normal heart sounds. Exam reveals no gallop and no friction rub.  No murmur heard. Pulmonary/Chest: Effort normal and breath sounds normal. No respiratory distress. She has no wheezes. She has no rales.  Musculoskeletal: Normal range of motion.  Lymphadenopathy:    She has no cervical adenopathy.  Neurological: She is alert and oriented to person, place, and time. She has normal strength. A sensory deficit is present.  No cranial nerve deficit. She displays a negative Romberg sign. Coordination and gait normal.  Skin: She is not diaphoretic.  Vitals reviewed.       Assessment & Plan:     1. Numbness Unsure of cause. Area of numbness appears to be at distal end of zygomatic branch of the right facial nerve. No other deficits. Will treat with prednisone as below to see if possible inflammatory damage to nerve due to dry-heaving. If no improvements may attempt to get CT head and refer to neurology for further evaluation and consideration of EMG testing.  - predniSONE (STERAPRED UNI-PAK 21 TAB) 10 MG (21) TBPK tablet; 6 day taper; take as directed on package instructions  Dispense: 21 tablet; Refill: 0       Mar Daring, PA-C  [facility]

## 2018-05-01 ENCOUNTER — Other Ambulatory Visit: Payer: Self-pay | Admitting: Physician Assistant

## 2018-05-01 ENCOUNTER — Telehealth: Payer: Self-pay | Admitting: Physician Assistant

## 2018-05-01 DIAGNOSIS — K219 Gastro-esophageal reflux disease without esophagitis: Secondary | ICD-10-CM

## 2018-05-01 DIAGNOSIS — F4322 Adjustment disorder with anxiety: Secondary | ICD-10-CM

## 2018-05-01 DIAGNOSIS — R200 Anesthesia of skin: Secondary | ICD-10-CM

## 2018-05-01 DIAGNOSIS — J309 Allergic rhinitis, unspecified: Secondary | ICD-10-CM

## 2018-05-01 MED ORDER — CITALOPRAM HYDROBROMIDE 20 MG PO TABS: 20.0000 mg | ORAL_TABLET | Freq: Every day | ORAL | 3 refills | Status: DC

## 2018-05-01 MED ORDER — OMEPRAZOLE 20 MG PO CPDR: DELAYED_RELEASE_CAPSULE | ORAL | 1 refills | Status: DC

## 2018-05-01 MED ORDER — FLUTICASONE PROPIONATE 50 MCG/ACT NA SUSP: NASAL | 3 refills | Status: DC

## 2018-05-01 NOTE — Telephone Encounter (Signed)
Will refer to neurology for further evaluation

## 2018-05-01 NOTE — Telephone Encounter (Signed)
Please Review

## 2018-05-01 NOTE — Telephone Encounter (Signed)
Pt called saying she got a notice from [facility] saying they need  new rx's on :   Citalopram 20 mg   Fluticasone nasal Spray  Omeprazole [place]  Thanks Con Memos

## 2018-05-01 NOTE — Telephone Encounter (Signed)
Patient was given Prednisone for numbness in the side of her face a couple of weeks ago and it did not work.  Please advise.

## 2018-05-01 NOTE — Telephone Encounter (Signed)
Patient advised.  Thanks,  -Joseline 

## 2018-05-17 ENCOUNTER — Ambulatory Visit (INDEPENDENT_AMBULATORY_CARE_PROVIDER_SITE_OTHER): Payer: Medicare HMO | Admitting: Physician Assistant

## 2018-05-17 ENCOUNTER — Ambulatory Visit (INDEPENDENT_AMBULATORY_CARE_PROVIDER_SITE_OTHER): Payer: Medicare HMO

## 2018-05-17 ENCOUNTER — Encounter: Payer: Self-pay | Admitting: Physician Assistant

## 2018-05-17 VITALS — BP 118/62 | HR 81 | Temp 98.9°F | Ht 65.0 in | Wt 147.2 lb

## 2018-05-17 DIAGNOSIS — Z85038 Personal history of other malignant neoplasm of large intestine: Secondary | ICD-10-CM

## 2018-05-17 DIAGNOSIS — Z0000 Encounter for general adult medical examination without abnormal findings: Secondary | ICD-10-CM

## 2018-05-17 DIAGNOSIS — F1010 Alcohol abuse, uncomplicated: Secondary | ICD-10-CM | POA: Diagnosis not present

## 2018-05-17 DIAGNOSIS — M8589 Other specified disorders of bone density and structure, multiple sites: Secondary | ICD-10-CM | POA: Diagnosis not present

## 2018-05-17 DIAGNOSIS — F3342 Major depressive disorder, recurrent, in full remission: Secondary | ICD-10-CM

## 2018-05-17 DIAGNOSIS — Z87798 Personal history of other (corrected) congenital malformations: Secondary | ICD-10-CM

## 2018-05-17 DIAGNOSIS — F329 Major depressive disorder, single episode, unspecified: Secondary | ICD-10-CM | POA: Diagnosis not present

## 2018-05-17 DIAGNOSIS — R739 Hyperglycemia, unspecified: Secondary | ICD-10-CM | POA: Diagnosis not present

## 2018-05-17 DIAGNOSIS — E875 Hyperkalemia: Secondary | ICD-10-CM | POA: Diagnosis not present

## 2018-05-17 NOTE — Progress Notes (Signed)
Patient: Lisa Caldwell, Female    DOB: May 22, 1946, 72 y.o.   MRN: [id number] Visit Date: 05/17/2018  Today's Provider: Mar Daring, PA-C   Chief Complaint  Patient presents with  . Annual Exam   Subjective:     Complete Physical Lisa Caldwell is a 72 y.o. female. She feels well. She reports exercising. She reports she is sleeping well.  AWV: Had it today with [facility] Mammogram:02/27/18-Recommend 6 months interval mammogram of left breast for possible calcifications to monitor for changes. Colonoscopy:03/10/16-Repeat in 5 years. Pap: Hysterectomy -----------------------------------------------------------   Review of Systems  Constitutional: Negative.   HENT: Negative.   Eyes: Negative.   Respiratory: Negative.   Cardiovascular: Negative.   Gastrointestinal: Negative.   Endocrine: Negative.   Genitourinary: Negative.   Musculoskeletal: Negative.   Skin: Negative.   Allergic/Immunologic: Negative.   Neurological: Positive for numbness (still in same pattern on right side of face since last seen).  Hematological: Negative.   Psychiatric/Behavioral: Negative.     Social History   Socioeconomic History  . Marital status: Married    Spouse name: Gershon Mussel  . Number of children: 2  . Years of education: Not on file  . Highest education level: 12th grade  Occupational History  . Occupation: Retired  Scientific laboratory technician  . Financial resource strain: Not hard at all  . Food insecurity:    Worry: Never true    Inability: Never true  . Transportation needs:    Medical: No    Non-medical: No  Tobacco Use  . Smoking status: Never Smoker  . Smokeless tobacco: Never Used  Substance and Sexual Activity  . Alcohol use: Yes    Alcohol/week: 6.0 oz    Types: 10 Cans of beer per week    Comment: beer  . Drug use: No  . Sexual activity: Never    Birth control/protection: Other-see comments    Comment: hysterectomy  Lifestyle  . Physical activity:    Days per week:  Not on file    Minutes per session: Not on file  . Stress: Not at all  Relationships  . Social connections:    Talks on phone: Not on file    Gets together: Not on file    Attends religious service: Not on file    Active member of club or organization: Not on file    Attends meetings of clubs or organizations: Not on file    Relationship status: Not on file  . Intimate partner violence:    Fear of current or ex partner: Not on file    Emotionally abused: Not on file    Physically abused: Not on file    Forced sexual activity: Not on file  Other Topics Concern  . Not on file  Social History Narrative  . Not on file    Past Medical History:  Diagnosis Date  . Allergy   . Anxiety   . Cancer [facility])    colon cancer  . Depression   . GERD (gastroesophageal reflux disease)   . Headache(784.0)   . Inflammatory polyps of colon with rectal bleeding ([place]) 2008    rt colectomy for a polyp with carcinoma in situ   . Personal history of malignant neoplasm of large intestine      Patient Active Problem List   Diagnosis Date Noted  . Alcohol abuse 05/16/2016  . Osteopenia 04/06/2016  . History of thyroglossal duct cyst removal 09/04/2015  . Persistent thyroglossal duct  cyst 07/25/2015  . Adaptation reaction 03/04/2015  . Screening for diabetes mellitus 03/04/2015  . Colon, diverticulosis 03/04/2015  . Elevated blood sugar 03/04/2015  . Acid reflux 03/04/2015  . High potassium 03/04/2015  . Hammer toe 03/04/2015  . Headache, migraine 03/04/2015  . Allergic rhinitis 04/03/2013  . Clinical depression 04/03/2013  . History of colon cancer 01/31/2013    Past Surgical History:  Procedure Laterality Date  . ABDOMINAL HYSTERECTOMY  1996  . APPENDECTOMY    . COLECTOMY Right 2008  . COLONOSCOPY  2012   Dr. Jamal Collin  . COLONOSCOPY WITH PROPOFOL N/A 03/10/2016   Procedure: COLONOSCOPY WITH PROPOFOL;  Surgeon: [name], MD;  Location: [facility] ENDOSCOPY;  Service: Endoscopy;   Laterality: N/A;  . FOOT SURGERY Left 2012  . FOOT SURGERY Left 2013  . THYROGLOSSAL DUCT CYST N/A 09/04/2015   Procedure: THYROGLOSSAL DUCT CYST;  Surgeon: Carloyn Manner, MD;  Location: [facility] ORS;  Service: ENT;  Laterality: N/A;    Her family history includes Drug abuse in her brother; Heart disease in her father and mother. There is no history of Breast cancer.      Current Outpatient Medications:  .  aspirin 81 MG tablet, Take 81 mg by mouth daily., Disp: , Rfl:  .  Cholecalciferol (VITAMIN D3 PO), Take by mouth daily. , Disp: , Rfl:  .  citalopram (CELEXA) 20 MG tablet, Take 1 tablet (20 mg total) by mouth daily., Disp: 90 tablet, Rfl: 3 .  fluticasone (FLONASE) 50 MCG/ACT nasal spray, USE 2 SPRAYS IN EACH NOSTRIL EVERY DAY (SUBSTITUTED FOR FLONASE), Disp: 48 g, Rfl: 3 .  Multiple Vitamin (MULTIVITAMIN) tablet, Take 1 tablet by mouth daily., Disp: , Rfl:  .  omeprazole (PRILOSEC) 20 MG capsule, TAKE 1 CAPSULE TWICE DAILY AS DIRECTED BEFORE A MEAL, Disp: 180 capsule, Rfl: 1 .  predniSONE (STERAPRED UNI-PAK 21 TAB) 10 MG (21) TBPK tablet, 6 day taper; take as directed on package instructions (Patient not taking: Reported on 05/17/2018), Disp: 21 tablet, Rfl: 0  Patient Care Team: Rubye Beach as PCP - General (Family Medicine)     Objective:    Physical Exam  Constitutional: She is oriented to person, place, and time. She appears well-developed and well-nourished. No distress.  HENT:  Head: Normocephalic and atraumatic.  Right Ear: Hearing, tympanic membrane, external ear and ear canal normal.  Left Ear: Hearing, tympanic membrane, external ear and ear canal normal.  Nose: Nose normal.  Mouth/Throat: Oropharynx is clear and moist. No oropharyngeal exudate.  Eyes: Pupils are equal, round, and reactive to light. Conjunctivae and EOM are normal. Right eye exhibits no discharge. Left eye exhibits no discharge. No scleral icterus.  Neck: Normal range of motion. Neck  supple. No JVD present. Carotid bruit is not present. No tracheal deviation present. No thyromegaly present.  Cardiovascular: Normal rate, regular rhythm, normal heart sounds and intact distal pulses. Exam reveals no gallop and no friction rub.  No murmur heard. Pulmonary/Chest: Effort normal and breath sounds normal. No respiratory distress. She has no wheezes. She has no rales. She exhibits no tenderness.  Abdominal: Soft. Bowel sounds are normal. She exhibits no distension and no mass. There is no tenderness. There is no rebound and no guarding.  Musculoskeletal: Normal range of motion. She exhibits no edema or tenderness.  Lymphadenopathy:    She has no cervical adenopathy.  Neurological: She is alert and oriented to person, place, and time.  Skin: Skin is warm and dry. No  rash noted. She is not diaphoretic.  Psychiatric: She has a normal mood and affect. Her behavior is normal. Judgment and thought content normal.  Vitals reviewed.   Activities of Daily Living In your present state of health, do you have any difficulty performing the following activities: 05/17/2018  Hearing? N  Vision? N  Difficulty concentrating or making decisions? N  Walking or climbing stairs? N  Dressing or bathing? N  Doing errands, shopping? N  Preparing Food and eating ? N  Using the Toilet? N  In the past six months, have you accidently leaked urine? N  Do you have problems with loss of bowel control? N  Managing your Medications? N  Managing your Finances? N  Housekeeping or managing your Housekeeping? N  Some recent data might be hidden    Fall Risk Assessment Fall Risk  05/17/2018 05/12/2017 05/13/2016 05/05/2015  Falls in the past year? No No No No     Depression Screen PHQ 2/9 Scores 05/17/2018 05/16/2017 05/12/2017 05/13/2016  PHQ - 2 Score 1 0 0 0  PHQ- 9 Score - 0 - -    Cognitive Testing - 6-CIT: Decline today per [facility]. 6CIT Screen 05/12/2017  What Year? 0 points  What month? 0 points  What  time? 0 points  Count back from 20 0 points  Months in reverse 0 points  Repeat phrase 0 points  Total Score 0        Assessment & Plan:    Annual Physical Reviewed patient's Family Medical History Reviewed and updated list of patient's medical providers Assessment of cognitive impairment was done Assessed patient's functional ability Established a written schedule for health screening [place] Completed and Reviewed  Exercise Activities and Dietary recommendations Goals    . Exercise 150 minutes per week (moderate activity)    . Reduce alcohol intake to 1 serving a day.        Immunization History  Administered Date(s) Administered  . H1N1 09/25/2008  . Influenza Split 07/16/2010, 07/28/2011, 08/08/2012  . Influenza, High Dose Seasonal PF 07/16/2014, 07/19/2015, 07/09/2016, 07/21/2017  . Influenza,inj,Quad PF,6+ Mos 07/12/2013  . Pneumococcal Conjugate-13 05/05/2015  . Pneumococcal Polysaccharide-23 08/27/2011  . Tdap 01/11/2006  . Zoster 08/17/2011  . Zoster Recombinat (Shingrix) 05/16/2017, 07/21/2017    Health Maintenance  Topic Date Due  . TETANUS/TDAP  10/18/2026 (Originally 01/12/2016)  . INFLUENZA VACCINE  05/18/2018  . MAMMOGRAM  02/28/2019  . COLONOSCOPY  03/10/2021  . DEXA SCAN  Completed  . Hepatitis C Screening  Completed  . PNA vac Low Risk Adult  Completed     Discussed health benefits of physical activity, and encouraged her to engage in regular exercise appropriate for her age and condition.    1. Annual physical exam Normal physical exam today. Will check labs as below and f/u pending lab results. If labs are stable and WNL she will not need to have these rechecked for one year at her next annual physical exam. She is to call the office in the meantime if she has any acute issue, questions or concerns. - CBC with Differential/Platelet - Comprehensive metabolic panel - Hemoglobin A1c  2. Osteopenia of multiple  sites Will check labs as below and f/u pending results. Continue weight bearing exercises. Discussed continuing calcium 1200mg  and Vit D [phone] IU daily minimum. Will check labs as below and f/u pending results. - CBC with Differential/Platelet - Comprehensive metabolic panel - Vitamin D (25 hydroxy)  3. Alcohol abuse Discussed use  and decreasing. Continues to drink about 10 beers per week. Will check labs as below and f/u pending results. - Comprehensive metabolic panel - Lipid panel - Vitamin B1  4. Elevated blood sugar Diet controlled. Will check labs as below and f/u pending results. - Comprehensive metabolic panel - Hemoglobin A1c  5. Recurrent major depressive disorder, in full remission ([place]) Stable on citalopram 20mg . Will check labs as below and f/u pending results.  6. History of colon cancer S/P right hemicolectomy. Repeat colonoscopy in 3 years. Last was 2017. Repeat every 5 years.   7. History of thyroglossal duct cyst removal Doing well. Will check labs as below and f/u pending results. - TSH  ------------------------------------------------------------------------------------------------------------    Mar Daring, PA-C  [facility]

## 2018-05-17 NOTE — Patient Instructions (Signed)
Health Maintenance for Postmenopausal Women Menopause is a normal process in which your reproductive ability comes to an end. This process happens gradually over a span of months to years, usually between the ages of 22 and 9. Menopause is complete when you have missed 12 consecutive menstrual periods. It is important to talk with your health care provider about some of the most common conditions that affect postmenopausal women, such as heart disease, cancer, and bone loss (osteoporosis). Adopting a healthy lifestyle and getting preventive care can help to promote your health and wellness. Those actions can also lower your chances of developing some of these common conditions. What should I know about menopause? During menopause, you may experience a number of symptoms, such as:  Moderate-to-severe hot flashes.  Night sweats.  Decrease in sex drive.  Mood swings.  Headaches.  Tiredness.  Irritability.  Memory problems.  Insomnia.  Choosing to treat or not to treat menopausal changes is an individual decision that you make with your health care provider. What should I know about hormone replacement therapy and supplements? Hormone therapy products are effective for treating symptoms that are associated with menopause, such as hot flashes and night sweats. Hormone replacement carries certain risks, especially as you become older. If you are thinking about using estrogen or estrogen with progestin treatments, discuss the benefits and risks with your health care provider. What should I know about heart disease and stroke? Heart disease, heart attack, and stroke become more likely as you age. This may be due, in part, to the hormonal changes that your body experiences during menopause. These can affect how your body processes dietary fats, triglycerides, and cholesterol. Heart attack and stroke are both medical emergencies. There are many things that you can do to help prevent heart disease  and stroke:  Have your blood pressure checked at least every 1-2 years. High blood pressure causes heart disease and increases the risk of stroke.  If you are 53-22 years old, ask your health care provider if you should take aspirin to prevent a heart attack or a stroke.  Do not use any tobacco products, including cigarettes, chewing tobacco, or electronic cigarettes. If you need help quitting, ask your health care provider.  It is important to eat a healthy diet and maintain a healthy weight. ? Be sure to include plenty of vegetables, fruits, low-fat dairy products, and lean protein. ? Avoid eating foods that are high in solid fats, added sugars, or salt (sodium).  Get regular exercise. This is one of the most important things that you can do for your health. ? Try to exercise for at least 150 minutes each week. The type of exercise that you do should increase your heart rate and make you sweat. This is known as moderate-intensity exercise. ? Try to do strengthening exercises at least twice each week. Do these in addition to the moderate-intensity exercise.  Know your numbers.Ask your health care provider to check your cholesterol and your blood glucose. Continue to have your blood tested as directed by your health care provider.  What should I know about cancer screening? There are several types of cancer. Take the following steps to reduce your risk and to catch any cancer development as early as possible. Breast Cancer  Practice breast self-awareness. ? This means understanding how your breasts normally appear and feel. ? It also means doing regular breast self-exams. Let your health care provider know about any changes, no matter how small.  If you are 40  or older, have a clinician do a breast exam (clinical breast exam or CBE) every year. Depending on your age, family history, and medical history, it may be recommended that you also have a yearly breast X-ray (mammogram).  If you  have a family history of breast cancer, talk with your health care provider about genetic screening.  If you are at high risk for breast cancer, talk with your health care provider about having an MRI and a mammogram every year.  Breast cancer (BRCA) gene test is recommended for women who have family members with BRCA-related cancers. Results of the assessment will determine the need for genetic counseling and BRCA1 and for BRCA2 testing. BRCA-related cancers include these types: ? Breast. This occurs in males or females. ? Ovarian. ? Tubal. This may also be called fallopian tube cancer. ? Cancer of the abdominal or pelvic lining (peritoneal cancer). ? Prostate. ? Pancreatic.  Cervical, Uterine, and Ovarian Cancer Your health care provider may recommend that you be screened regularly for cancer of the pelvic organs. These include your ovaries, uterus, and vagina. This screening involves a pelvic exam, which includes checking for microscopic changes to the surface of your cervix (Pap test).  For women ages 21-65, health care providers may recommend a pelvic exam and a Pap test every three years. For women ages 80-65, they may recommend the Pap test and pelvic exam, combined with testing for human papilloma virus (HPV), every five years. Some types of HPV increase your risk of cervical cancer. Testing for HPV may also be done on women of any age who have unclear Pap test results.  Other health care providers may not recommend any screening for nonpregnant women who are considered low risk for pelvic cancer and have no symptoms. Ask your health care provider if a screening pelvic exam is right for you.  If you have had past treatment for cervical cancer or a condition that could lead to cancer, you need Pap tests and screening for cancer for at least 20 years after your treatment. If Pap tests have been discontinued for you, your risk factors (such as having a new sexual partner) need to be  reassessed to determine if you should start having screenings again. Some women have medical problems that increase the chance of getting cervical cancer. In these cases, your health care provider may recommend that you have screening and Pap tests more often.  If you have a family history of uterine cancer or ovarian cancer, talk with your health care provider about genetic screening.  If you have vaginal bleeding after reaching menopause, tell your health care provider.  There are currently no reliable tests available to screen for ovarian cancer.  Lung Cancer Lung cancer screening is recommended for adults 75-94 years old who are at high risk for lung cancer because of a history of smoking. A yearly low-dose CT scan of the lungs is recommended if you:  Currently smoke.  Have a history of at least 30 pack-years of smoking and you currently smoke or have quit within the past 15 years. A pack-year is smoking an average of one pack of cigarettes per day for one year.  Yearly screening should:  Continue until it has been 15 years since you quit.  Stop if you develop a health problem that would prevent you from having lung cancer treatment.  Colorectal Cancer  This type of cancer can be detected and can often be prevented.  Routine colorectal cancer screening usually begins at  age 42 and continues through age 45.  If you have risk factors for colon cancer, your health care provider may recommend that you be screened at an earlier age.  If you have a family history of colorectal cancer, talk with your health care provider about genetic screening.  Your health care provider may also recommend using home test kits to check for hidden blood in your stool.  A small camera at the end of a tube can be used to examine your colon directly (sigmoidoscopy or colonoscopy). This is done to check for the earliest forms of colorectal cancer.  Direct examination of the colon should be repeated every  5-10 years until age 71. However, if early forms of precancerous polyps or small growths are found or if you have a family history or genetic risk for colorectal cancer, you may need to be screened more often.  Skin Cancer  Check your skin from head to toe regularly.  Monitor any moles. Be sure to tell your health care provider: ? About any new moles or changes in moles, especially if there is a change in a mole's shape or color. ? If you have a mole that is larger than the size of a pencil eraser.  If any of your family members has a history of skin cancer, especially at a young age, talk with your health care provider about genetic screening.  Always use sunscreen. Apply sunscreen liberally and repeatedly throughout the day.  Whenever you are outside, protect yourself by wearing long sleeves, pants, a wide-brimmed hat, and sunglasses.  What should I know about osteoporosis? Osteoporosis is a condition in which bone destruction happens more quickly than new bone creation. After menopause, you may be at an increased risk for osteoporosis. To help prevent osteoporosis or the bone fractures that can happen because of osteoporosis, the following is recommended:  If you are 46-71 years old, get at least 1,000 mg of calcium and at least 600 mg of vitamin D per day.  If you are older than age 55 but younger than age 65, get at least 1,200 mg of calcium and at least 600 mg of vitamin D per day.  If you are older than age 54, get at least 1,200 mg of calcium and at least 800 mg of vitamin D per day.  Smoking and excessive alcohol intake increase the risk of osteoporosis. Eat foods that are rich in calcium and vitamin D, and do weight-bearing exercises several times each week as directed by your health care provider. What should I know about how menopause affects my mental health? Depression may occur at any age, but it is more common as you become older. Common symptoms of depression  include:  Low or sad mood.  Changes in sleep patterns.  Changes in appetite or eating patterns.  Feeling an overall lack of motivation or enjoyment of activities that you previously enjoyed.  Frequent crying spells.  Talk with your health care provider if you think that you are experiencing depression. What should I know about immunizations? It is important that you get and maintain your immunizations. These include:  Tetanus, diphtheria, and pertussis (Tdap) booster vaccine.  Influenza every year before the flu season begins.  Pneumonia vaccine.  Shingles vaccine.  Your health care provider may also recommend other immunizations. This information is not intended to replace advice given to you by your health care provider. Make sure you discuss any questions you have with your health care provider. Document Released: 11/26/2005  Document Revised: 04/23/2016 Document Reviewed: 07/08/2015 Elsevier Interactive Patient Education  2018 [employer]  

## 2018-05-17 NOTE — Patient Instructions (Addendum)
[name] , Thank you for taking time to come for your Medicare Wellness Visit. I appreciate your ongoing commitment to your health goals. Please review the following plan we discussed and let me know if I can assist you in the future.   Screening recommendations/referrals: Colonoscopy: Up to date Mammogram: Up to date Bone Density: Up to date Recommended yearly ophthalmology/optometry visit for glaucoma screening and checkup Recommended yearly dental visit for hygiene and checkup  Vaccinations: Influenza vaccine: Up to date Pneumococcal vaccine: Up to date Tdap vaccine: Pt declines today.  Shingles vaccine: Pt declines today.     Advanced directives: Please bring a copy of your POA (Power of Attorney) and/or Living Will to your next appointment.   Conditions/risks identified: Continue to work on cutting back on alcohol to 1 serving a day and no more.   Next appointment: 10 AM today with Lisa Caldwell.    Preventive Care 40 Years and Older, Female Preventive care refers to lifestyle choices and visits with your health care provider that can promote health and wellness. What does preventive care include?  A yearly physical exam. This is also called an annual well check.  Dental exams once or twice a year.  Routine eye exams. Ask your health care provider how often you should have your eyes checked.  Personal lifestyle choices, including:  Daily care of your teeth and gums.  Regular physical activity.  Eating a healthy diet.  Avoiding tobacco and drug use.  Limiting alcohol use.  Practicing safe sex.  Taking low-dose aspirin every day.  Taking vitamin and mineral supplements as recommended by your health care provider. What happens during an annual well check? The services and screenings done by your health care provider during your annual well check will depend on your age, overall health, lifestyle risk factors, and family history of disease. Counseling  Your  health care provider may ask you questions about your:  Alcohol use.  Tobacco use.  Drug use.  Emotional well-being.  Home and relationship well-being.  Sexual activity.  Eating habits.  History of falls.  Memory and ability to understand (cognition).  Work and work Statistician.  Reproductive health. Screening  You may have the following tests or measurements:  Height, weight, and BMI.  Blood pressure.  Lipid and cholesterol levels. These may be checked every 5 years, or more frequently if you are over 78 years old.  Skin check.  Lung cancer screening. You may have this screening every year starting at age 34 if you have a 30-pack-year history of smoking and currently smoke or have quit within the past 15 years.  Fecal occult blood test (FOBT) of the stool. You may have this test every year starting at age 3.  Flexible sigmoidoscopy or colonoscopy. You may have a sigmoidoscopy every 5 years or a colonoscopy every 10 years starting at age 73.  Hepatitis C blood test.  Hepatitis B blood test.  Sexually transmitted disease (STD) testing.  Diabetes screening. This is done by checking your blood sugar (glucose) after you have not eaten for a while (fasting). You may have this done every 1-3 years.  Bone density scan. This is done to screen for osteoporosis. You may have this done starting at age 81.  Mammogram. This may be done every 1-2 years. Talk to your health care provider about how often you should have regular mammograms. Talk with your health care provider about your test results, treatment options, and if necessary, the need for more tests.  Vaccines  Your health care provider may recommend certain vaccines, such as:  Influenza vaccine. This is recommended every year.  Tetanus, diphtheria, and acellular pertussis (Tdap, Td) vaccine. You may need a Td booster every 10 years.  Zoster vaccine. You may need this after age 77.  Pneumococcal 13-valent  conjugate (PCV13) vaccine. One dose is recommended after age 75.  Pneumococcal polysaccharide (PPSV23) vaccine. One dose is recommended after age 28. Talk to your health care provider about which screenings and vaccines you need and how often you need them. This information is not intended to replace advice given to you by your health care provider. Make sure you discuss any questions you have with your health care provider. Document Released: 10/31/2015 Document Revised: 06/23/2016 Document Reviewed: 08/05/2015 Elsevier Interactive Patient Education  2017 [place] Prevention in the Home Falls can cause injuries. They can happen to people of all ages. There are many things you can do to make your home safe and to help prevent falls. What can I do on the outside of my home?  Regularly fix the edges of walkways and driveways and fix any cracks.  Remove anything that might make you trip as you walk through a door, such as a raised step or threshold.  Trim any bushes or trees on the path to your home.  Use bright outdoor lighting.  Clear any walking paths of anything that might make someone trip, such as rocks or tools.  Regularly check to see if handrails are loose or broken. Make sure that both sides of any steps have handrails.  Any raised decks and porches should have guardrails on the edges.  Have any leaves, snow, or ice cleared regularly.  Use sand or salt on walking paths during winter.  Clean up any spills in your garage right away. This includes oil or grease spills. What can I do in the bathroom?  Use night lights.  Install grab bars by the toilet and in the tub and shower. Do not use towel bars as grab bars.  Use non-skid mats or decals in the tub or shower.  If you need to sit down in the shower, use a plastic, non-slip stool.  Keep the floor dry. Clean up any water that spills on the floor as soon as it happens.  Remove soap buildup in the tub or  shower regularly.  Attach bath mats securely with double-sided non-slip rug tape.  Do not have throw rugs and other things on the floor that can make you trip. What can I do in the bedroom?  Use night lights.  Make sure that you have a light by your bed that is easy to reach.  Do not use any sheets or blankets that are too big for your bed. They should not hang down onto the floor.  Have a firm chair that has side arms. You can use this for support while you get dressed.  Do not have throw rugs and other things on the floor that can make you trip. What can I do in the kitchen?  Clean up any spills right away.  Avoid walking on wet floors.  Keep items that you use a lot in easy-to-reach places.  If you need to reach something above you, use a strong step stool that has a grab bar.  Keep electrical cords out of the way.  Do not use floor polish or wax that makes floors slippery. If you must use wax, use non-skid floor wax.  Do not have throw rugs and other things on the floor that can make you trip. What can I do with my stairs?  Do not leave any items on the stairs.  Make sure that there are handrails on both sides of the stairs and use them. Fix handrails that are broken or loose. Make sure that handrails are as long as the stairways.  Check any carpeting to make sure that it is firmly attached to the stairs. Fix any carpet that is loose or worn.  Avoid having throw rugs at the top or bottom of the stairs. If you do have throw rugs, attach them to the floor with carpet tape.  Make sure that you have a light switch at the top of the stairs and the bottom of the stairs. If you do not have them, ask someone to add them for you. What else can I do to help prevent falls?  Wear shoes that:  Do not have high heels.  Have rubber bottoms.  Are comfortable and fit you well.  Are closed at the toe. Do not wear sandals.  If you use a stepladder:  Make sure that it is fully  opened. Do not climb a closed stepladder.  Make sure that both sides of the stepladder are locked into place.  Ask someone to hold it for you, if possible.  Clearly mark and make sure that you can see:  Any grab bars or handrails.  First and last steps.  Where the edge of each step is.  Use tools that help you move around (mobility aids) if they are needed. These include:  Canes.  Walkers.  Scooters.  Crutches.  Turn on the lights when you go into a dark area. Replace any light bulbs as soon as they burn out.  Set up your furniture so you have a clear path. Avoid moving your furniture around.  If any of your floors are uneven, fix them.  If there are any pets around you, be aware of where they are.  Review your medicines with your doctor. Some medicines can make you feel dizzy. This can increase your chance of falling. Ask your doctor what other things that you can do to help prevent falls. This information is not intended to replace advice given to you by your health care provider. Make sure you discuss any questions you have with your health care provider. Document Released: 07/31/2009 Document Revised: 03/11/2016 Document Reviewed: 11/08/2014 Elsevier Interactive Patient Education  2017 [employer].

## 2018-05-17 NOTE — Progress Notes (Signed)
Subjective:   Lisa Caldwell is a 73 y.o. female who presents for Medicare Annual (Subsequent) preventive examination.  Review of Systems:  N/A  Cardiac Risk Factors include: advanced age (>1men, >25 women)     Objective:     Vitals: BP 118/62 (BP Location: Right Arm)   Pulse 81   Temp 98.9 F (37.2 C) (Oral)   Ht 5\' 5"  (1.651 m)   Wt 147 lb 3.2 oz (66.8 kg)   BMI 24.50 kg/m   Body mass index is 24.5 kg/m.  Advanced Directives 05/17/2018 05/12/2017 09/04/2015 08/21/2015 05/05/2015  Does Patient Have a Medical Advance Directive? Yes No No No No  Type of Paramedic of [place];Living will - - - -  Copy of [place] in Chart? No - copy requested - - - -  Would patient like information on creating a medical advance directive? - No - Patient declined Yes - Scientist, clinical (histocompatibility and immunogenetics) given Yes - Scientist, clinical (histocompatibility and immunogenetics) given -    Tobacco Social History   Tobacco Use  Smoking Status Never Smoker  Smokeless Tobacco Never Used     Counseling given: Not Answered   Clinical Intake:  Pre-visit preparation completed: Yes  Pain : No/denies pain Pain Score: 0-No pain     Nutritional Status: BMI of 19-24  Normal Nutritional Risks: None Diabetes: No  How often do you need to have someone help you when you read instructions, pamphlets, or other written materials from your doctor or pharmacy?: 1 - Never  Interpreter Needed?: No  Information entered by :: [facility], LPN  Past Medical History:  Diagnosis Date  . Allergy   . Anxiety   . Cancer [facility])    colon cancer  . Depression   . GERD (gastroesophageal reflux disease)   . Headache(784.0)   . Inflammatory polyps of colon with rectal bleeding ([place]) 2008    rt colectomy for a polyp with carcinoma in situ   . Personal history of malignant neoplasm of large intestine    Past Surgical History:  Procedure Laterality Date  . ABDOMINAL HYSTERECTOMY  1996  . APPENDECTOMY    .  COLECTOMY Right 2008  . COLONOSCOPY  2012   Dr. Jamal Collin  . COLONOSCOPY WITH PROPOFOL N/A 03/10/2016   Procedure: COLONOSCOPY WITH PROPOFOL;  Surgeon: [name], MD;  Location: [facility] ENDOSCOPY;  Service: Endoscopy;  Laterality: N/A;  . FOOT SURGERY Left 2012  . FOOT SURGERY Left 2013  . THYROGLOSSAL DUCT CYST N/A 09/04/2015   Procedure: THYROGLOSSAL DUCT CYST;  Surgeon: Carloyn Manner, MD;  Location: [facility] ORS;  Service: ENT;  Laterality: N/A;   Family History  Problem Relation Age of Onset  . Heart disease Mother   . Heart disease Father   . Drug abuse Brother   . Breast cancer Neg Hx    Social History   Socioeconomic History  . Marital status: Married    Spouse name: Gershon Mussel  . Number of children: 2  . Years of education: Not on file  . Highest education level: 12th grade  Occupational History  . Occupation: Retired  Scientific laboratory technician  . Financial resource strain: Not hard at all  . Food insecurity:    Worry: Never true    Inability: Never true  . Transportation needs:    Medical: No    Non-medical: No  Tobacco Use  . Smoking status: Never Smoker  . Smokeless tobacco: Never Used  Substance and Sexual Activity  . Alcohol use: Yes  Alcohol/week: 6.0 oz    Types: 10 Cans of beer per week    Comment: beer  . Drug use: No  . Sexual activity: Never    Birth control/protection: Other-see comments    Comment: hysterectomy  Lifestyle  . Physical activity:    Days per week: Not on file    Minutes per session: Not on file  . Stress: Not at all  Relationships  . Social connections:    Talks on phone: Not on file    Gets together: Not on file    Attends religious service: Not on file    Active member of club or organization: Not on file    Attends meetings of clubs or organizations: Not on file    Relationship status: Not on file  Other Topics Concern  . Not on file  Social History Narrative  . Not on file    Outpatient Encounter Medications as of 05/17/2018    Medication Sig  . aspirin 81 MG tablet Take 81 mg by mouth daily.  . Cholecalciferol (VITAMIN D3 PO) Take by mouth daily.   . citalopram (CELEXA) 20 MG tablet Take 1 tablet (20 mg total) by mouth daily.  . fluticasone (FLONASE) 50 MCG/ACT nasal spray USE 2 SPRAYS IN EACH NOSTRIL EVERY DAY (SUBSTITUTED FOR FLONASE)  . Multiple Vitamin (MULTIVITAMIN) tablet Take 1 tablet by mouth daily.  Marland Kitchen omeprazole (PRILOSEC) 20 MG capsule TAKE 1 CAPSULE TWICE DAILY AS DIRECTED BEFORE A MEAL  . predniSONE (STERAPRED UNI-PAK 21 TAB) 10 MG (21) TBPK tablet 6 day taper; take as directed on package instructions (Patient not taking: Reported on 05/17/2018)   No facility-administered encounter medications on file as of 05/17/2018.     Activities of Daily Living In your present state of health, do you have any difficulty performing the following activities: 05/17/2018  Hearing? N  Vision? N  Difficulty concentrating or making decisions? N  Walking or climbing stairs? N  Dressing or bathing? N  Doing errands, shopping? N  Preparing Food and eating ? N  Using the Toilet? N  In the past six months, have you accidently leaked urine? N  Do you have problems with loss of bowel control? N  Managing your Medications? N  Managing your Finances? N  Housekeeping or managing your Housekeeping? N  Some recent data might be hidden    Patient Care Team: Rubye Beach as PCP - General (Family Medicine)    Assessment:   This is a routine wellness examination for [place].  Exercise Activities and Dietary recommendations Current Exercise Habits: Home exercise routine, Type of exercise: Other - see comments;strength training/weights;stretching(aerobics), Time (Minutes): 30, Frequency (Times/Week): 5, Weekly Exercise (Minutes/Week): 150, Intensity: Mild, Exercise limited by: None identified  Goals    . Exercise 150 minutes per week (moderate activity)    . Reduce alcohol intake to 1 serving a day.         Fall Risk Fall Risk  05/17/2018 05/12/2017 05/13/2016 05/05/2015  Falls in the past year? No No No No   Is the patient's home free of loose throw rugs in walkways, pet beds, electrical cords, etc?   yes      Grab bars in the bathroom? yes      Handrails on the stairs?   no      Adequate lighting?   yes  Timed Get Up and Go performed: N/A  Depression Screen PHQ 2/9 Scores 05/17/2018 05/16/2017 05/12/2017 05/13/2016  PHQ - 2 Score 1  0 0 0  PHQ- 9 Score - 0 - -     Cognitive Function: Pt declined screening today.      6CIT Screen 05/12/2017  What Year? 0 points  What month? 0 points  What time? 0 points  Count back from 20 0 points  Months in reverse 0 points  Repeat phrase 0 points  Total Score 0    Immunization History  Administered Date(s) Administered  . H1N1 09/25/2008  . Influenza Split 07/16/2010, 07/28/2011, 08/08/2012  . Influenza, High Dose Seasonal PF 07/16/2014, 07/19/2015, 07/09/2016, 07/21/2017  . Influenza,inj,Quad PF,6+ Mos 07/12/2013  . Pneumococcal Conjugate-13 05/05/2015  . Pneumococcal Polysaccharide-23 08/27/2011  . Tdap 01/11/2006  . Zoster 08/17/2011  . Zoster Recombinat (Shingrix) 05/16/2017, 07/21/2017    Qualifies for Shingles Vaccine? Due for Shingles vaccine. Declined my offer to administer today. Education has been provided regarding the importance of this vaccine. Pt has been advised to call her insurance company to determine her out of pocket expense. Advised she may also receive this vaccine at her local pharmacy or Health Dept. Verbalized acceptance and understanding.  Screening Tests Health Maintenance  Topic Date Due  . TETANUS/TDAP  10/18/2026 (Originally 01/12/2016)  . INFLUENZA VACCINE  05/18/2018  . MAMMOGRAM  02/28/2020  . COLONOSCOPY  03/11/2023  . DEXA SCAN  Completed  . Hepatitis C Screening  Completed  . PNA vac Low Risk Adult  Completed    Cancer Screenings: Lung: Low Dose CT Chest recommended if Age 64-80 years, 30  pack-year currently smoking OR have quit w/in 15years. Patient does not qualify. Breast:  Up to date on Mammogram? Yes   Up to date of Bone Density/Dexa? Yes Colorectal: Up to date  Additional Screenings:  Hepatitis C Screening: Up to date     Plan:  I have personally reviewed and addressed the Medicare Annual Wellness questionnaire and have noted the following in the patient's chart:  A. Medical and social history B. Use of alcohol, tobacco or illicit drugs  C. Current medications and supplements D. Functional ability and status E.  Nutritional status F.  Physical activity G. Advance directives H. List of other physicians I.  Hospitalizations, surgeries, and ER visits in previous 12 months J.  [place] such as hearing and vision if needed, cognitive and depression L. Referrals and appointments - none  In addition, I have reviewed and discussed with patient certain preventive protocols, quality metrics, and best practice recommendations. A written personalized care plan for preventive services as well as general preventive health recommendations were provided to patient.  See attached scanned questionnaire for additional information.   Signed,  Fabio Neighbors, LPN Nurse Health Advisor   Nurse Recommendations: Pt declined the tetanus vaccine today.

## 2018-05-19 DIAGNOSIS — R200 Anesthesia of skin: Secondary | ICD-10-CM | POA: Insufficient documentation

## 2018-05-20 LAB — LIPID PANEL
CHOL/HDL RATIO: 2.1 ratio (ref 0.0–4.4)
Cholesterol, Total: 189 mg/dL (ref 100–199)
HDL: 88 mg/dL (ref >39)
LDL CALC: 88 mg/dL (ref 0–99)
Triglycerides: 64 mg/dL (ref 0–149)
VLDL Cholesterol Cal: 13 mg/dL (ref 5–40)

## 2018-05-20 LAB — COMPREHENSIVE METABOLIC PANEL
A/G RATIO: 1.5 (ref 1.2–2.2)
ALT: 23 IU/L (ref 0–32)
AST: 28 IU/L (ref 0–40)
Albumin: 4 g/dL (ref 3.5–4.8)
Alkaline Phosphatase: 61 IU/L (ref 39–117)
BILIRUBIN TOTAL: 0.3 mg/dL (ref 0.0–1.2)
BUN / CREAT RATIO: 8 — AB (ref 12–28)
BUN: 6 mg/dL — ABNORMAL LOW (ref 8–27)
CALCIUM: 9.2 mg/dL (ref 8.7–10.3)
CHLORIDE: 106 mmol/L (ref 96–106)
CO2: 21 mmol/L (ref 20–29)
Creatinine, Ser: 0.75 mg/dL (ref 0.57–1.00)
GFR, EST AFRICAN AMERICAN: 92 mL/min/{1.73_m2} (ref >59)
GFR, EST NON AFRICAN AMERICAN: 80 mL/min/{1.73_m2} (ref >59)
Globulin, Total: 2.6 g/dL (ref 1.5–4.5)
Glucose: 98 mg/dL (ref 65–99)
POTASSIUM: 4.1 mmol/L (ref 3.5–5.2)
Sodium: 140 mmol/L (ref 134–144)
TOTAL PROTEIN: 6.6 g/dL (ref 6.0–8.5)

## 2018-05-20 LAB — CBC WITH DIFFERENTIAL/PLATELET
BASOS: 1 %
Basophils Absolute: 0 10*3/uL (ref 0.0–0.2)
EOS (ABSOLUTE): 0.1 10*3/uL (ref 0.0–0.4)
Eos: 3 %
HEMATOCRIT: 39.3 % (ref 34.0–46.6)
HEMOGLOBIN: 12.7 g/dL (ref 11.1–15.9)
IMMATURE GRANS (ABS): 0 10*3/uL (ref 0.0–0.1)
IMMATURE GRANULOCYTES: 0 %
LYMPHS: 32 %
Lymphocytes Absolute: 1.4 10*3/uL (ref 0.7–3.1)
MCH: 32.3 pg (ref 26.6–33.0)
MCHC: 32.3 g/dL (ref 31.5–35.7)
MCV: 100 fL — AB (ref 79–97)
MONOCYTES: 10 %
Monocytes Absolute: 0.5 10*3/uL (ref 0.1–0.9)
NEUTROS ABS: 2.4 10*3/uL (ref 1.4–7.0)
Neutrophils: 54 %
Platelets: 268 10*3/uL (ref 150–450)
RBC: 3.93 x10E6/uL (ref 3.77–5.28)
RDW: 12.3 % (ref 12.3–15.4)
WBC: 4.4 10*3/uL (ref 3.4–10.8)

## 2018-05-20 LAB — VITAMIN D 25 HYDROXY (VIT D DEFICIENCY, FRACTURES): VIT D 25 HYDROXY: 50.9 ng/mL (ref 30.0–100.0)

## 2018-05-20 LAB — VITAMIN B1: THIAMINE: 156.7 nmol/L (ref 66.5–200.0)

## 2018-05-20 LAB — TSH: TSH: 1.53 u[IU]/mL (ref 0.450–4.500)

## 2018-05-20 LAB — HEMOGLOBIN A1C
Est. average glucose Bld gHb Est-mCnc: 103 mg/dL
Hgb A1c MFr Bld: 5.2 % (ref 4.8–5.6)

## 2018-05-22 ENCOUNTER — Telehealth: Payer: Self-pay

## 2018-05-22 NOTE — Telephone Encounter (Signed)
Pt advised.   Thanks,   -Laura  

## 2018-05-22 NOTE — Telephone Encounter (Signed)
-----   Message from Mar Daring, PA-C sent at 05/22/2018  8:51 AM EDT ----- All labs are within normal limits and stable.  Thanks! -JB

## 2018-08-08 ENCOUNTER — Other Ambulatory Visit: Payer: Self-pay | Admitting: Physician Assistant

## 2018-08-08 ENCOUNTER — Telehealth: Payer: Self-pay | Admitting: Physician Assistant

## 2018-08-08 DIAGNOSIS — N6320 Unspecified lump in the left breast, unspecified quadrant: Secondary | ICD-10-CM

## 2018-08-08 DIAGNOSIS — R921 Mammographic calcification found on diagnostic imaging of breast: Secondary | ICD-10-CM

## 2018-08-08 NOTE — Telephone Encounter (Signed)
Pt called needing a diagnostic mammogram done in November.  She tried to schedule one at [place] herself and they told her she would need to get her provider to make it.  This will be her 6 month FU  CB#  [phone]  Thanks teri

## 2018-08-09 NOTE — Telephone Encounter (Signed)
Placed order for diagnostic left mammogram.

## 2018-08-09 NOTE — Telephone Encounter (Signed)
Patient advised.

## 2018-08-14 ENCOUNTER — Telehealth: Payer: Self-pay | Admitting: Physician Assistant

## 2018-08-14 NOTE — Telephone Encounter (Signed)
Pt called saying she was told by [place] last week that anorder for a diagnostic mammogram was sent to [facility] but she said she talked to them this morning and they told her that is has not been received yet.  CB#  [phone]  Thanks Con Memos

## 2018-08-22 NOTE — Telephone Encounter (Signed)
I'm confused, I see an order placed on 10/23 and scheduled for 11/12.Marland KitchenMarland Kitchen

## 2018-08-23 NOTE — Telephone Encounter (Signed)
Left detailed  Message.

## 2018-08-29 ENCOUNTER — Ambulatory Visit
Admission: RE | Admit: 2018-08-29 | Discharge: 2018-08-29 | Disposition: A | Discharge status: Home / Self Care | Payer: Medicare HMO | Source: Ambulatory Visit | Attending: Physician Assistant | Admitting: Physician Assistant

## 2018-08-29 DIAGNOSIS — R921 Mammographic calcification found on diagnostic imaging of breast: Secondary | ICD-10-CM | POA: Insufficient documentation

## 2018-09-27 ENCOUNTER — Other Ambulatory Visit: Payer: Self-pay | Admitting: Physician Assistant

## 2018-09-27 DIAGNOSIS — K219 Gastro-esophageal reflux disease without esophagitis: Secondary | ICD-10-CM

## 2018-09-30 IMAGING — MG DIGITAL SCREENING BILATERAL MAMMOGRAM WITH CAD
7 series · 7 of 7 positions shown · non-contrast
Comparison: Previous exam(s).

CLINICAL DATA: Screening.

EXAM:
DIGITAL SCREENING BILATERAL MAMMOGRAM WITH CAD

[R CC]
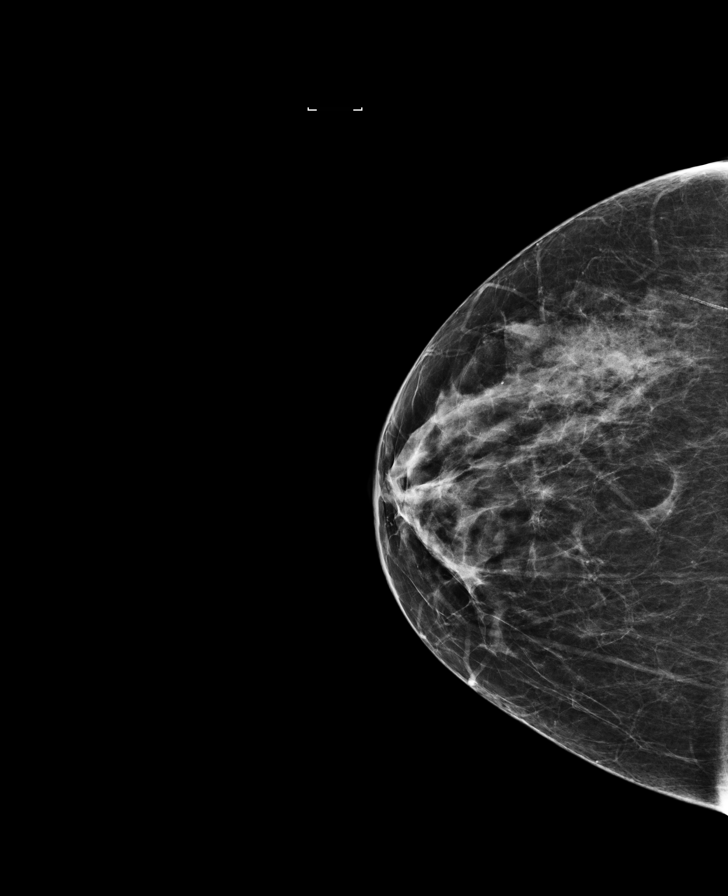

[L CC (1 of 2)]
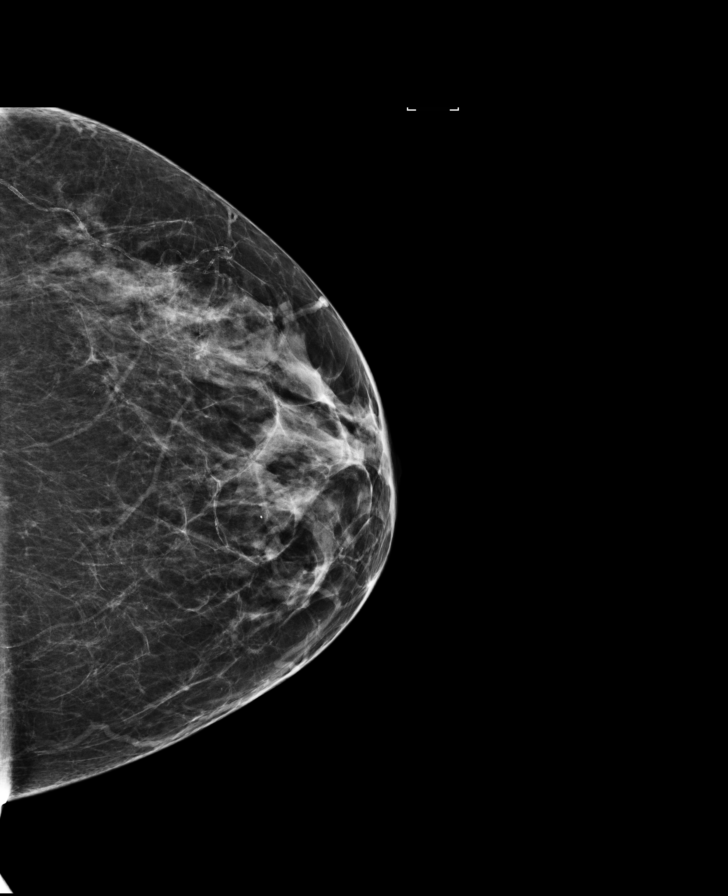

[L MLO (1 of 2)]
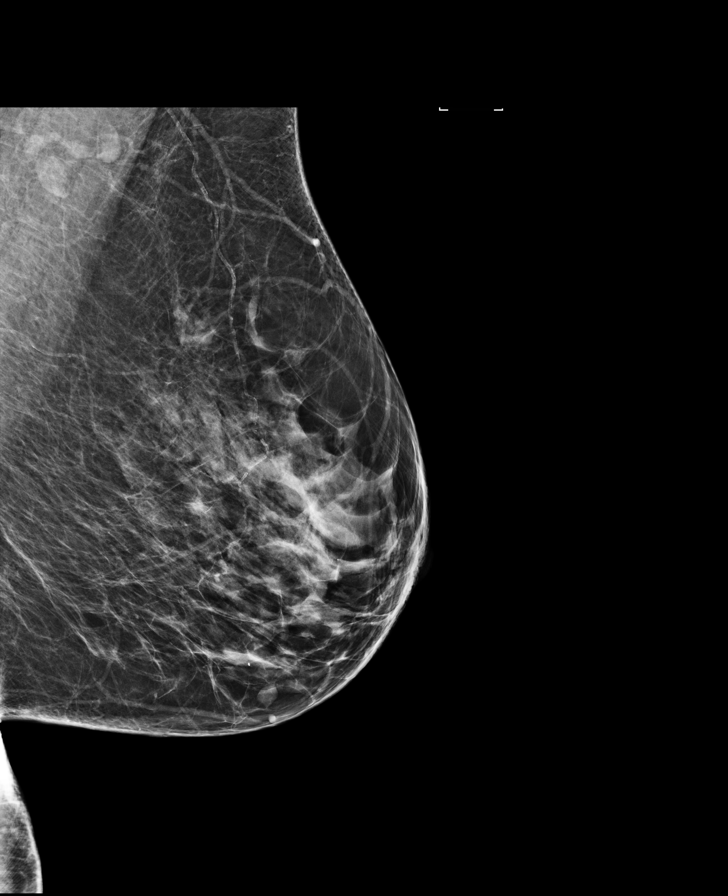

[R MLO (1 of 2)]
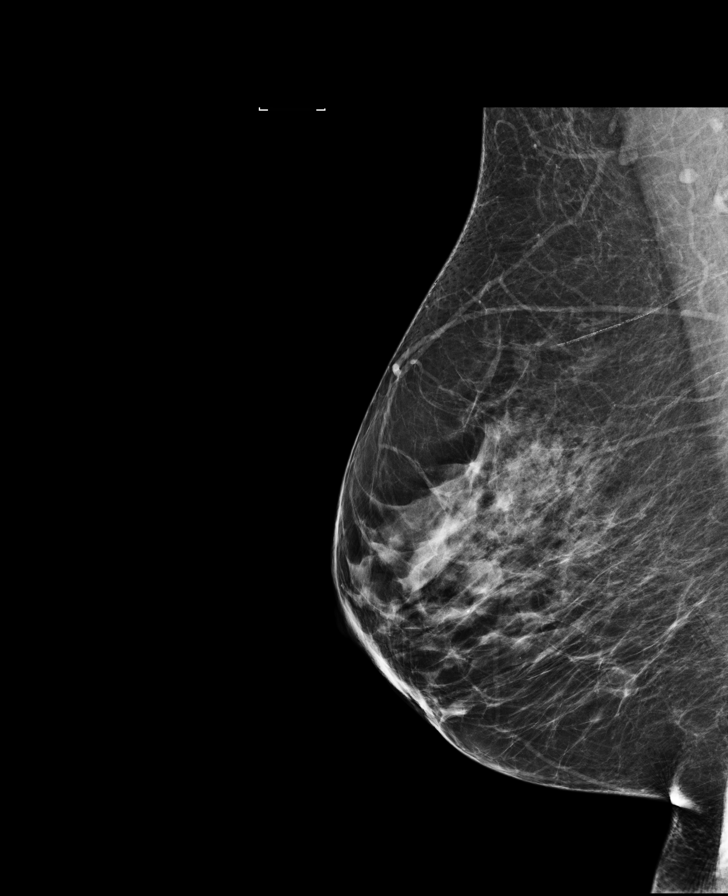

[R MLO (2 of 2)]
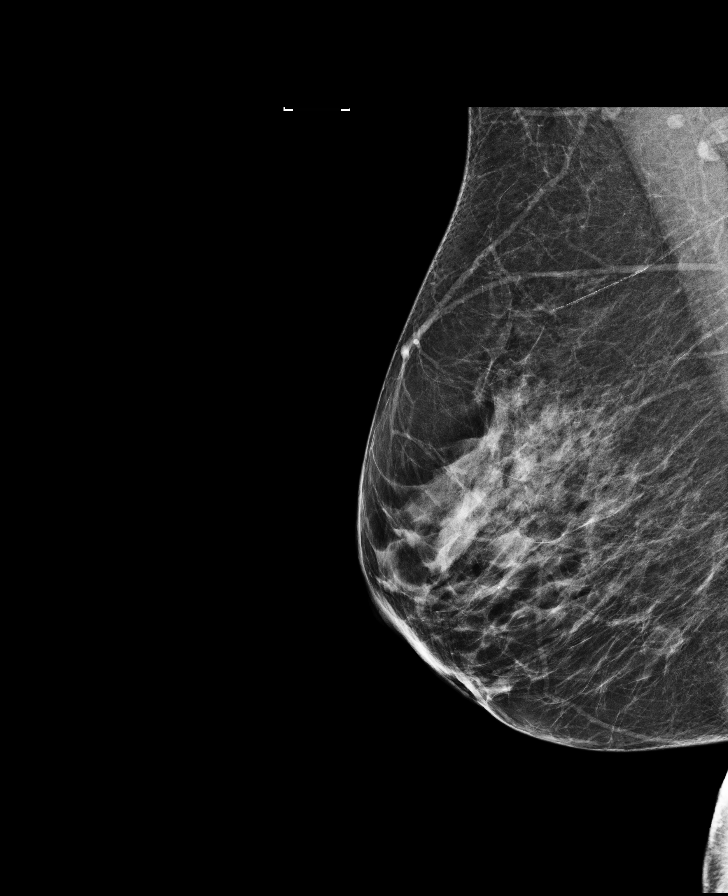

[L CC (2 of 2)]
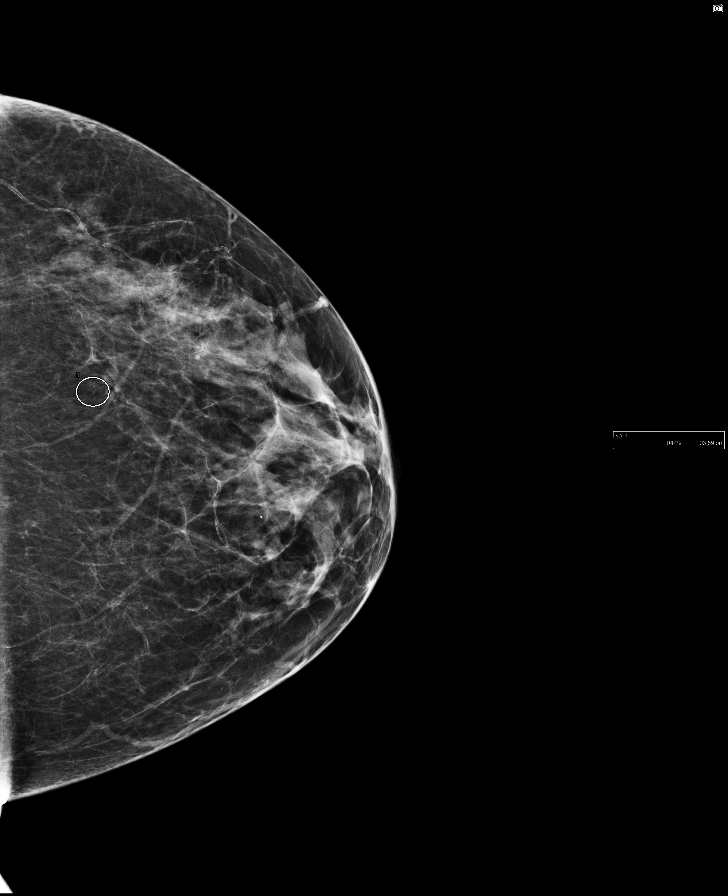

[L MLO (2 of 2)]
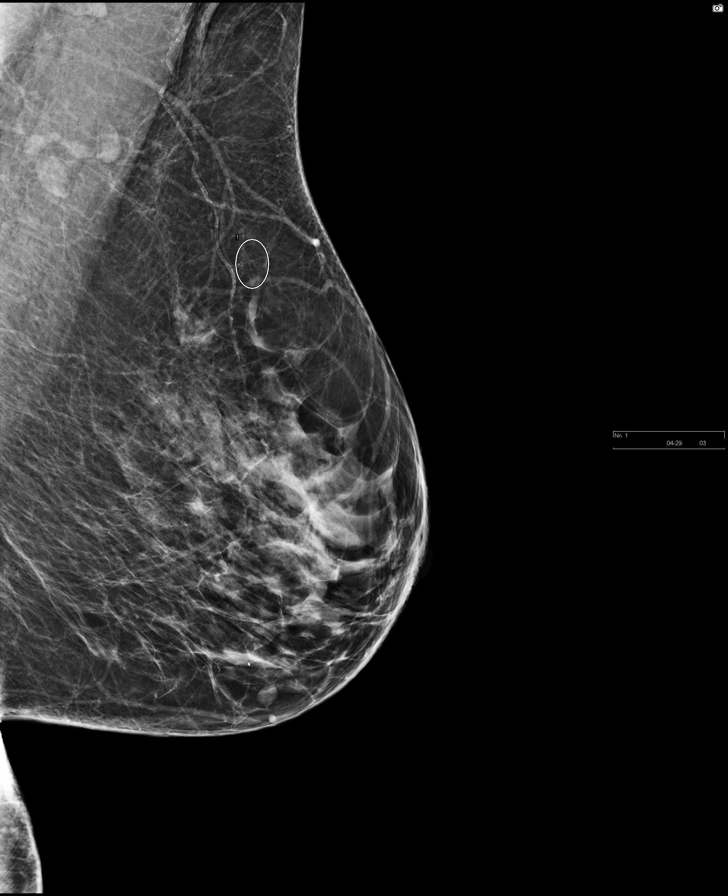

[7 of 7 positions shown; findings below may reference images not displayed]

ACR Breast Density Category c: The breast tissue is heterogeneously
dense, which may obscure small masses.
FINDINGS: In the left breast, calcifications warrant further evaluation. In
the right breast, no findings suspicious for malignancy. Images were
processed with CAD.
IMPRESSION: Further evaluation is suggested for calcifications in the left
breast.

RECOMMENDATION:
Diagnostic mammogram of the left breast. (Code:[id number])

The patient will be contacted regarding the findings, and additional
imaging will be scheduled.

BI-RADS CATEGORY  0: Incomplete. Need additional imaging evaluation
and/or prior mammograms for comparison.

## 2019-03-08 ENCOUNTER — Other Ambulatory Visit: Payer: Self-pay | Admitting: Physician Assistant

## 2019-03-08 DIAGNOSIS — J309 Allergic rhinitis, unspecified: Secondary | ICD-10-CM

## 2019-03-08 DIAGNOSIS — F4322 Adjustment disorder with anxiety: Secondary | ICD-10-CM

## 2019-03-08 DIAGNOSIS — K219 Gastro-esophageal reflux disease without esophagitis: Secondary | ICD-10-CM

## 2019-03-19 ENCOUNTER — Telehealth: Payer: Self-pay | Admitting: Physician Assistant

## 2019-03-19 DIAGNOSIS — R921 Mammographic calcification found on diagnostic imaging of breast: Secondary | ICD-10-CM

## 2019-03-19 NOTE — Telephone Encounter (Signed)
Pt needs to have an order sent on to [facility] for a mammogram diagnostic bilt.  CB#  [phone]  Thanks Con Memos

## 2019-03-19 NOTE — Telephone Encounter (Signed)
Ordered

## 2019-03-20 NOTE — Telephone Encounter (Signed)
Left message on vm notifying pt.

## 2019-04-03 ENCOUNTER — Ambulatory Visit
Admission: RE | Admit: 2019-04-03 | Discharge: 2019-04-03 | Disposition: A | Discharge status: Home / Self Care | Payer: Medicare HMO | Source: Ambulatory Visit | Attending: Physician Assistant | Admitting: Physician Assistant

## 2019-04-03 ENCOUNTER — Other Ambulatory Visit: Payer: Self-pay

## 2019-04-03 DIAGNOSIS — R921 Mammographic calcification found on diagnostic imaging of breast: Secondary | ICD-10-CM | POA: Insufficient documentation

## 2019-05-22 ENCOUNTER — Ambulatory Visit (INDEPENDENT_AMBULATORY_CARE_PROVIDER_SITE_OTHER): Payer: Medicare HMO

## 2019-05-22 ENCOUNTER — Encounter: Payer: Self-pay | Admitting: Physician Assistant

## 2019-05-22 ENCOUNTER — Ambulatory Visit (INDEPENDENT_AMBULATORY_CARE_PROVIDER_SITE_OTHER): Payer: Medicare HMO | Admitting: Physician Assistant

## 2019-05-22 ENCOUNTER — Other Ambulatory Visit: Payer: Self-pay

## 2019-05-22 VITALS — BP 134/83 | HR 73 | Temp 98.4°F | Resp 16 | Ht 64.0 in | Wt 150.2 lb

## 2019-05-22 DIAGNOSIS — Z0000 Encounter for general adult medical examination without abnormal findings: Secondary | ICD-10-CM

## 2019-05-22 DIAGNOSIS — E875 Hyperkalemia: Secondary | ICD-10-CM

## 2019-05-22 DIAGNOSIS — R0600 Dyspnea, unspecified: Secondary | ICD-10-CM

## 2019-05-22 DIAGNOSIS — F3342 Major depressive disorder, recurrent, in full remission: Secondary | ICD-10-CM

## 2019-05-22 DIAGNOSIS — F1010 Alcohol abuse, uncomplicated: Secondary | ICD-10-CM | POA: Diagnosis not present

## 2019-05-22 DIAGNOSIS — Z1239 Encounter for other screening for malignant neoplasm of breast: Secondary | ICD-10-CM | POA: Diagnosis not present

## 2019-05-22 MED ORDER — ALBUTEROL SULFATE HFA 108 (90 BASE) MCG/ACT IN AERS: 2.0000 | INHALATION_SPRAY | Freq: Four times a day (QID) | RESPIRATORY_TRACT | 0 refills | Status: DC | PRN

## 2019-05-22 NOTE — Patient Instructions (Signed)
[name] , Thank you for taking time to come for your Medicare Wellness Visit. I appreciate your ongoing commitment to your health goals. Please review the following plan we discussed and let me know if I can assist you in the future.   Screening recommendations/referrals: Colonoscopy: Up to date, due 02/2021 Mammogram: Up to date, due 03/2021 Bone Density: Up to date, due 03/2021 Recommended yearly ophthalmology/optometry visit for glaucoma screening and checkup Recommended yearly dental visit for hygiene and checkup  Vaccinations: Influenza vaccine: Up to date Pneumococcal vaccine: Completed series Tdap vaccine: Pt declines today.  Shingles vaccine: Completed series    Advanced directives: Please bring a copy of your POA (Power of Attorney) and/or Living Will to your next appointment.   Conditions/risks identified: Continue to decrease amount of alcohol to 0-1 servings a day.   Next appointment: 2:00 PM today with Fenton Malling.    Preventive Care 73 Years and Older, Female Preventive care refers to lifestyle choices and visits with your health care provider that can promote health and wellness. What does preventive care include?  A yearly physical exam. This is also called an annual well check.  Dental exams once or twice a year.  Routine eye exams. Ask your health care provider how often you should have your eyes checked.  Personal lifestyle choices, including:  Daily care of your teeth and gums.  Regular physical activity.  Eating a healthy diet.  Avoiding tobacco and drug use.  Limiting alcohol use.  Practicing safe sex.  Taking low-dose aspirin every day.  Taking vitamin and mineral supplements as recommended by your health care provider. What happens during an annual well check? The services and screenings done by your health care provider during your annual well check will depend on your age, overall health, lifestyle risk factors, and family history  of disease. Counseling  Your health care provider may ask you questions about your:  Alcohol use.  Tobacco use.  Drug use.  Emotional well-being.  Home and relationship well-being.  Sexual activity.  Eating habits.  History of falls.  Memory and ability to understand (cognition).  Work and work Statistician.  Reproductive health. Screening  You may have the following tests or measurements:  Height, weight, and BMI.  Blood pressure.  Lipid and cholesterol levels. These may be checked every 5 years, or more frequently if you are over 16 years old.  Skin check.  Lung cancer screening. You may have this screening every year starting at age 98 if you have a 30-pack-year history of smoking and currently smoke or have quit within the past 15 years.  Fecal occult blood test (FOBT) of the stool. You may have this test every year starting at age 64.  Flexible sigmoidoscopy or colonoscopy. You may have a sigmoidoscopy every 5 years or a colonoscopy every 10 years starting at age 66.  Hepatitis C blood test.  Hepatitis B blood test.  Sexually transmitted disease (STD) testing.  Diabetes screening. This is done by checking your blood sugar (glucose) after you have not eaten for a while (fasting). You may have this done every 1-3 years.  Bone density scan. This is done to screen for osteoporosis. You may have this done starting at age 35.  Mammogram. This may be done every 1-2 years. Talk to your health care provider about how often you should have regular mammograms. Talk with your health care provider about your test results, treatment options, and if necessary, the need for more tests. Vaccines  Your health care provider may recommend certain vaccines, such as:  Influenza vaccine. This is recommended every year.  Tetanus, diphtheria, and acellular pertussis (Tdap, Td) vaccine. You may need a Td booster every 10 years.  Zoster vaccine. You may need this after age 36.   Pneumococcal 13-valent conjugate (PCV13) vaccine. One dose is recommended after age 41.  Pneumococcal polysaccharide (PPSV23) vaccine. One dose is recommended after age 36. Talk to your health care provider about which screenings and vaccines you need and how often you need them. This information is not intended to replace advice given to you by your health care provider. Make sure you discuss any questions you have with your health care provider. Document Released: 10/31/2015 Document Revised: 06/23/2016 Document Reviewed: 08/05/2015 Elsevier Interactive Patient Education  2017 [place] Prevention in the Home Falls can cause injuries. They can happen to people of all ages. There are many things you can do to make your home safe and to help prevent falls. What can I do on the outside of my home?  Regularly fix the edges of walkways and driveways and fix any cracks.  Remove anything that might make you trip as you walk through a door, such as a raised step or threshold.  Trim any bushes or trees on the path to your home.  Use bright outdoor lighting.  Clear any walking paths of anything that might make someone trip, such as rocks or tools.  Regularly check to see if handrails are loose or broken. Make sure that both sides of any steps have handrails.  Any raised decks and porches should have guardrails on the edges.  Have any leaves, snow, or ice cleared regularly.  Use sand or salt on walking paths during winter.  Clean up any spills in your garage right away. This includes oil or grease spills. What can I do in the bathroom?  Use night lights.  Install grab bars by the toilet and in the tub and shower. Do not use towel bars as grab bars.  Use non-skid mats or decals in the tub or shower.  If you need to sit down in the shower, use a plastic, non-slip stool.  Keep the floor dry. Clean up any water that spills on the floor as soon as it happens.  Remove soap  buildup in the tub or shower regularly.  Attach bath mats securely with double-sided non-slip rug tape.  Do not have throw rugs and other things on the floor that can make you trip. What can I do in the bedroom?  Use night lights.  Make sure that you have a light by your bed that is easy to reach.  Do not use any sheets or blankets that are too big for your bed. They should not hang down onto the floor.  Have a firm chair that has side arms. You can use this for support while you get dressed.  Do not have throw rugs and other things on the floor that can make you trip. What can I do in the kitchen?  Clean up any spills right away.  Avoid walking on wet floors.  Keep items that you use a lot in easy-to-reach places.  If you need to reach something above you, use a strong step stool that has a grab bar.  Keep electrical cords out of the way.  Do not use floor polish or wax that makes floors slippery. If you must use wax, use non-skid floor wax.  Do  not have throw rugs and other things on the floor that can make you trip. What can I do with my stairs?  Do not leave any items on the stairs.  Make sure that there are handrails on both sides of the stairs and use them. Fix handrails that are broken or loose. Make sure that handrails are as long as the stairways.  Check any carpeting to make sure that it is firmly attached to the stairs. Fix any carpet that is loose or worn.  Avoid having throw rugs at the top or bottom of the stairs. If you do have throw rugs, attach them to the floor with carpet tape.  Make sure that you have a light switch at the top of the stairs and the bottom of the stairs. If you do not have them, ask someone to add them for you. What else can I do to help prevent falls?  Wear shoes that:  Do not have high heels.  Have rubber bottoms.  Are comfortable and fit you well.  Are closed at the toe. Do not wear sandals.  If you use a stepladder:  Make  sure that it is fully opened. Do not climb a closed stepladder.  Make sure that both sides of the stepladder are locked into place.  Ask someone to hold it for you, if possible.  Clearly mark and make sure that you can see:  Any grab bars or handrails.  First and last steps.  Where the edge of each step is.  Use tools that help you move around (mobility aids) if they are needed. These include:  Canes.  Walkers.  Scooters.  Crutches.  Turn on the lights when you go into a dark area. Replace any light bulbs as soon as they burn out.  Set up your furniture so you have a clear path. Avoid moving your furniture around.  If any of your floors are uneven, fix them.  If there are any pets around you, be aware of where they are.  Review your medicines with your doctor. Some medicines can make you feel dizzy. This can increase your chance of falling. Ask your doctor what other things that you can do to help prevent falls. This information is not intended to replace advice given to you by your health care provider. Make sure you discuss any questions you have with your health care provider. Document Released: 07/31/2009 Document Revised: 03/11/2016 Document Reviewed: 11/08/2014 Elsevier Interactive Patient Education  2017 [employer].

## 2019-05-22 NOTE — Patient Instructions (Signed)
Health Maintenance After Age 73 After age 73, you are at a higher risk for certain long-term diseases and infections as well as injuries from falls. Falls are a major cause of broken bones and head injuries in people who are older than age 73. Getting regular preventive care can help to keep you healthy and well. Preventive care includes getting regular testing and making lifestyle changes as recommended by your health care provider. Talk with your health care provider about:  Which screenings and tests you should have. A screening is a test that checks for a disease when you have no symptoms.  A diet and exercise plan that is right for you. What should I know about screenings and tests to prevent falls? Screening and testing are the best ways to find a health problem early. Early diagnosis and treatment give you the best chance of managing medical conditions that are common after age 73. Certain conditions and lifestyle choices may make you more likely to have a fall. Your health care provider may recommend:  Regular vision checks. Poor vision and conditions such as cataracts can make you more likely to have a fall. If you wear glasses, make sure to get your prescription updated if your vision changes.  Medicine review. Work with your health care provider to regularly review all of the medicines you are taking, including over-the-counter medicines. Ask your health care provider about any side effects that may make you more likely to have a fall. Tell your health care provider if any medicines that you take make you feel dizzy or sleepy.  Osteoporosis screening. Osteoporosis is a condition that causes the bones to get weaker. This can make the bones weak and cause them to break more easily.  Blood pressure screening. Blood pressure changes and medicines to control blood pressure can make you feel dizzy.  Strength and balance checks. Your health care provider may recommend certain tests to check your  strength and balance while standing, walking, or changing positions.  Foot health exam. Foot pain and numbness, as well as not wearing proper footwear, can make you more likely to have a fall.  Depression screening. You may be more likely to have a fall if you have a fear of falling, feel emotionally low, or feel unable to do activities that you used to do.  Alcohol use screening. Using too much alcohol can affect your balance and may make you more likely to have a fall. What actions can I take to lower my risk of falls? General instructions  Talk with your health care provider about your risks for falling. Tell your health care provider if: ? You fall. Be sure to tell your health care provider about all falls, even ones that seem minor. ? You feel dizzy, sleepy, or off-balance.  Take over-the-counter and prescription medicines only as told by your health care provider. These include any supplements.  Eat a healthy diet and maintain a healthy weight. A healthy diet includes low-fat dairy products, low-fat (lean) meats, and fiber from whole grains, beans, and lots of fruits and vegetables. Home safety  Remove any tripping hazards, such as rugs, cords, and clutter.  Install safety equipment such as grab bars in bathrooms and safety rails on stairs.  Keep rooms and walkways well-lit. Activity   Follow a regular exercise program to stay fit. This will help you maintain your balance. Ask your health care provider what types of exercise are appropriate for you.  If you need a cane or   walker, use it as recommended by your health care provider.  Wear supportive shoes that have nonskid soles. Lifestyle  Do not drink alcohol if your health care provider tells you not to drink.  If you drink alcohol, limit how much you have: ? 0-1 drink a day for women. ? 0-2 drinks a day for men.  Be aware of how much alcohol is in your drink. In the [place] one drink equals one typical bottle of beer (12  oz), one-half glass of wine (5 oz), or one shot of hard liquor (1 oz).  Do not use any products that contain nicotine or tobacco, such as cigarettes and e-cigarettes. If you need help quitting, ask your health care provider. Summary  Having a healthy lifestyle and getting preventive care can help to protect your health and wellness after age 73.  Screening and testing are the best way to find a health problem early and help you avoid having a fall. Early diagnosis and treatment give you the best chance for managing medical conditions that are more common for people who are older than age 73.  Falls are a major cause of broken bones and head injuries in people who are older than age 73. Take precautions to prevent a fall at home.  Work with your health care provider to learn what changes you can make to improve your health and wellness and to prevent falls. This information is not intended to replace advice given to you by your health care provider. Make sure you discuss any questions you have with your health care provider. Document Released: 08/17/2017 Document Revised: 01/25/2019 Document Reviewed: 08/17/2017 Elsevier Patient Education  2020 [employer]  

## 2019-05-22 NOTE — Progress Notes (Signed)
Patient: Lisa Caldwell, Female    DOB: 1946-04-19, 73 y.o.   MRN: [id number] Visit Date: 05/22/2019  Today's Provider: Mar Daring, PA-C   Chief Complaint  Patient presents with  . Annual Exam   Subjective:     Complete Physical Lisa Caldwell is a 73 y.o. female. She feels well. She reports exercising, aerobics and taichi. She reports she is sleeping well. -----------------------------------------------------------   Review of Systems  Constitutional: Negative.   HENT: Negative.   Eyes: Negative.   Respiratory: Negative.   Cardiovascular: Negative.   Gastrointestinal: Negative.   Endocrine: Negative.   Genitourinary: Negative.   Musculoskeletal: Negative.   Skin: Negative.   Allergic/Immunologic: Positive for food allergies.  Neurological: Negative.   Hematological: Negative.   Psychiatric/Behavioral: Negative.     Social History   Socioeconomic History  . Marital status: Married    Spouse name: Gershon Mussel  . Number of children: 2  . Years of education: Not on file  . Highest education level: 12th grade  Occupational History  . Occupation: Retired  Scientific laboratory technician  . Financial resource strain: Not hard at all  . Food insecurity    Worry: Never true    Inability: Never true  . Transportation needs    Medical: No    Non-medical: No  Tobacco Use  . Smoking status: Never Smoker  . Smokeless tobacco: Never Used  Substance and Sexual Activity  . Alcohol use: Yes    Alcohol/week: 10.0 standard drinks    Types: 10 Cans of beer per week    Comment: beer  . Drug use: No  . Sexual activity: Never    Birth control/protection: Other-see comments    Comment: hysterectomy  Lifestyle  . Physical activity    Days per week: 4 days    Minutes per session: 30 min  . Stress: Not at all  Relationships  . Social Herbalist on phone: Patient refused    Gets together: Patient refused    Attends religious service: Patient refused    Active member of  club or organization: Patient refused    Attends meetings of clubs or organizations: Patient refused    Relationship status: Patient refused  . Intimate partner violence    Fear of current or ex partner: Patient refused    Emotionally abused: Patient refused    Physically abused: Patient refused    Forced sexual activity: Patient refused  Other Topics Concern  . Not on file  Social History Narrative  . Not on file    Past Medical History:  Diagnosis Date  . Allergy   . Anxiety   . Cancer [facility])    colon cancer  . Depression   . GERD (gastroesophageal reflux disease)   . Headache(784.0)   . Inflammatory polyps of colon with rectal bleeding ([place]) 2008    rt colectomy for a polyp with carcinoma in situ   . Personal history of malignant neoplasm of large intestine      Patient Active Problem List   Diagnosis Date Noted  . Alcohol abuse 05/16/2016  . Osteopenia 04/06/2016  . History of thyroglossal duct cyst removal 09/04/2015  . Persistent thyroglossal duct cyst 07/25/2015  . Adaptation reaction 03/04/2015  . Colon, diverticulosis 03/04/2015  . Elevated blood sugar 03/04/2015  . Acid reflux 03/04/2015  . High potassium 03/04/2015  . Hammer toe 03/04/2015  . Headache, migraine 03/04/2015  . Allergic rhinitis 04/03/2013  . Clinical depression  04/03/2013  . History of colon cancer 01/31/2013    Past Surgical History:  Procedure Laterality Date  . ABDOMINAL HYSTERECTOMY  1996  . APPENDECTOMY    . COLECTOMY Right 2008  . COLONOSCOPY  2012   Dr. Jamal Collin  . COLONOSCOPY WITH PROPOFOL N/A 03/10/2016   Procedure: COLONOSCOPY WITH PROPOFOL;  Surgeon: [name], MD;  Location: [facility] ENDOSCOPY;  Service: Endoscopy;  Laterality: N/A;  . FOOT SURGERY Left 2012  . FOOT SURGERY Left 2013  . THYROGLOSSAL DUCT CYST N/A 09/04/2015   Procedure: THYROGLOSSAL DUCT CYST;  Surgeon: Carloyn Manner, MD;  Location: [facility] ORS;  Service: ENT;  Laterality: N/A;    Her family  history includes Drug abuse in her brother; Heart disease in her father and mother. There is no history of Breast cancer.   Current Outpatient Medications:  .  aspirin 81 MG tablet, Take 81 mg by mouth daily., Disp: , Rfl:  .  Cholecalciferol (VITAMIN D3 PO), Take by mouth daily. , Disp: , Rfl:  .  citalopram (CELEXA) 20 MG tablet, TAKE 1 TABLET EVERY DAY, Disp: 90 tablet, Rfl: 3 .  fluticasone (FLONASE) 50 MCG/ACT nasal spray, USE 2 SPRAYS IN EACH NOSTRIL EVERY DAY (SUBSTITUTED FOR FLONASE), Disp: 48 g, Rfl: 3 .  Multiple Vitamin (MULTIVITAMIN) tablet, Take 1 tablet by mouth daily., Disp: , Rfl:  .  omeprazole (PRILOSEC) 20 MG capsule, TAKE 1 CAPSULE TWICE DAILY AS DIRECTED BEFORE A MEAL, Disp: 180 capsule, Rfl: 1 .  vitamin B-12 (CYANOCOBALAMIN) 1000 MCG tablet, Take 1,000 mcg by mouth daily., Disp: , Rfl:   Patient Care Team: Mar Daring, PA-C as PCP - General (Family Medicine)     Objective:    Vitals: BP 134/83 (BP Location: Left Arm, Patient Position: Sitting, Cuff Size: Normal)   Pulse 73   Temp 98.4 F (36.9 C) (Oral)   Resp 16   Ht 5\' 4"  (1.626 m)   Wt 150 lb 3.2 oz (68.1 kg)   BMI 25.78 kg/m   Physical Exam Vitals signs reviewed.  Constitutional:      General: She is not in acute distress.    Appearance: Normal appearance. She is normal weight. She is not ill-appearing.  HENT:     Head: Normocephalic and atraumatic.     Right Ear: Tympanic membrane, ear canal and external ear normal.     Left Ear: Tympanic membrane, ear canal and external ear normal.     Nose: Nose normal.     Mouth/Throat:     Mouth: Mucous membranes are moist.     Pharynx: Oropharynx is clear.  Eyes:     Extraocular Movements: Extraocular movements intact.     Conjunctiva/sclera: Conjunctivae normal.     Pupils: Pupils are equal, round, and reactive to light.  Neck:     Musculoskeletal: Normal range of motion and neck supple.     Vascular: No carotid bruit.  Cardiovascular:      Rate and Rhythm: Normal rate and regular rhythm.     Pulses: Normal pulses.     Heart sounds: Normal heart sounds.  Pulmonary:     Effort: Pulmonary effort is normal.     Breath sounds: Normal breath sounds.  Abdominal:     General: Abdomen is flat. Bowel sounds are normal.  Musculoskeletal: Normal range of motion.     Right lower leg: No edema.     Left lower leg: No edema.  Lymphadenopathy:     Cervical: No cervical adenopathy.  Skin:    General: Skin is warm and dry.     Capillary Refill: Capillary refill takes less than 2 seconds.  Neurological:     General: No focal deficit present.     Mental Status: She is alert and oriented to person, place, and time. Mental status is at baseline.     Cranial Nerves: No cranial nerve deficit.     Motor: No weakness.     Coordination: Coordination normal.     Gait: Gait normal.  Psychiatric:        Mood and Affect: Mood normal.        Behavior: Behavior normal.        Thought Content: Thought content normal.        Judgment: Judgment normal.     Activities of Daily Living In your present state of health, do you have any difficulty performing the following activities: 05/22/2019  Hearing? N  Vision? Y  Comment Due to needing an updated a prescription.  Difficulty concentrating or making decisions? N  Walking or climbing stairs? N  Dressing or bathing? N  Doing errands, shopping? N  Preparing Food and eating ? N  Using the Toilet? N  In the past six months, have you accidently leaked urine? N  Do you have problems with loss of bowel control? N  Managing your Medications? N  Managing your Finances? N  Housekeeping or managing your Housekeeping? N  Some recent data might be hidden    Fall Risk Assessment Fall Risk  05/22/2019 05/17/2018 05/12/2017 05/13/2016 05/05/2015  Falls in the past year? 0 No No No No     Depression Screen PHQ 2/9 Scores 05/22/2019 05/17/2018 05/16/2017 05/12/2017  PHQ - 2 Score 0 1 0 0  PHQ- 9 Score - - 0 -    Cognitive Function: Declined today with NHA. 6CIT Screen 05/12/2017  What Year? 0 points  What month? 0 points  What time? 0 points  Count back from 20 0 points  Months in reverse 0 points  Repeat phrase 0 points  Total Score 0       Assessment & Plan:    Annual Physical Reviewed patient's Family Medical History Reviewed and updated list of patient's medical providers Assessment of cognitive impairment was done Assessed patient's functional ability Established a written schedule for health screening [place] Completed and Reviewed  Exercise Activities and Dietary recommendations Goals    . Exercise 150 minutes per week (moderate activity)    . Reduce alcohol intake to 1 serving a day.        Immunization History  Administered Date(s) Administered  . H1N1 09/25/2008  . Influenza Split 07/16/2010, 07/28/2011, 08/08/2012  . Influenza, High Dose Seasonal PF 07/16/2014, 07/19/2015, 07/09/2016, 07/21/2017, 07/19/2018  . Influenza,inj,Quad PF,6+ Mos 07/12/2013  . Pneumococcal Conjugate-13 05/05/2015  . Pneumococcal Polysaccharide-23 08/27/2011  . Tdap 01/11/2006  . Zoster 08/17/2011  . Zoster Recombinat (Shingrix) 05/16/2017, 07/21/2017    Health Maintenance  Topic Date Due  . INFLUENZA VACCINE  05/19/2019  . TETANUS/TDAP  10/18/2026 (Originally 01/12/2016)  . MAMMOGRAM  04/02/2020  . COLONOSCOPY  03/10/2021  . DEXA SCAN  04/05/2021  . Hepatitis C Screening  Completed  . PNA vac Low Risk Adult  Completed     Discussed health benefits of physical activity, and encouraged her to engage in regular exercise appropriate for her age and condition.    1. Annual physical exam Normal physical exam today. Will check labs as below and  f/u pending lab results. If labs are stable and WNL she will not need to have these rechecked for one year at her next annual physical exam. She is to call the office in the meantime if she has any acute issue, questions or  concerns. - CBC w/Diff/Platelet - Comprehensive Metabolic Panel (CMET) - Lipid Profile - TSH - HgB A1c  2. Breast cancer screening Mammogram was done on 04/03/19. Repeat 1 year. H/O abnormal.   3. Alcohol abuse Still reports drinking 10-14 cans of beer weekly. Will check labs as below and f/u pending results. - CBC w/Diff/Platelet - Comprehensive Metabolic Panel (CMET) - Lipid Profile - TSH - HgB A1c  4. Recurrent major depressive disorder, in full remission ([place]) Stable. Continue citalopram 20mg . Will check labs as below and f/u pending results. - CBC w/Diff/Platelet - Comprehensive Metabolic Panel (CMET) - Lipid Profile - TSH - HgB A1c  5. High potassium H/O this. Will check labs as below and f/u pending results. - CBC w/Diff/Platelet - Comprehensive Metabolic Panel (CMET) - Lipid Profile - HgB A1c  6. PND (paroxysmal nocturnal dyspnea) Acute episodes that occur once every 3-6 months. Will give albuterol inhaler to see if that helps her catch her breath as she reports it can take 20-40 minutes to catch her breath. Call if not working.  - albuterol (VENTOLIN HFA) 108 (90 Base) MCG/ACT inhaler; Inhale 2 puffs into the lungs every 6 (six) hours as needed for wheezing or shortness of breath.  Dispense: 18 g; Refill: 0  ------------------------------------------------------------------------------------------------------------    Mar Daring, PA-C  [facility]

## 2019-05-22 NOTE — Progress Notes (Signed)
Subjective:   Lisa Caldwell is a 73 y.o. female who presents for Medicare Annual (Subsequent) preventive examination.    This visit is being conducted through telemedicine due to the COVID-19 pandemic. This patient has given me verbal consent via doximity to conduct this visit, patient states they are participating from their home address. Some vital signs may be absent or patient reported.    Patient identification: identified by name, DOB, and current address  Review of Systems:  N/A  Cardiac Risk Factors include: advanced age (>41men, >46 women);sedentary lifestyle     Objective:     Vitals: There were no vitals taken for this visit.  There is no height or weight on file to calculate BMI. Unable to obtain vitals due to visit being conducted via telephonically.   Advanced Directives 05/22/2019 05/17/2018 05/12/2017 09/04/2015 08/21/2015 05/05/2015  Does Patient Have a Medical Advance Directive? Yes Yes No No No No  Type of Paramedic of [place];Living will [place];Living will - - - -  Copy of [place] in Chart? No - copy requested No - copy requested - - - -  Would patient like information on creating a medical advance directive? - - No - Patient declined Yes - Scientist, clinical (histocompatibility and immunogenetics) given Yes - Scientist, clinical (histocompatibility and immunogenetics) given -    Tobacco Social History   Tobacco Use  Smoking Status Never Smoker  Smokeless Tobacco Never Used     Counseling given: Not Answered   Clinical Intake:  Pre-visit preparation completed: Yes  Pain : No/denies pain Pain Score: 0-No pain     Nutritional Risks: None Diabetes: No  How often do you need to have someone help you when you read instructions, pamphlets, or other written materials from your doctor or pharmacy?: 1 - Never  Interpreter Needed?: No  Information entered by :: [facility], LPN  Past Medical History:  Diagnosis Date  . Allergy   . Anxiety   . Cancer [facility])     colon cancer  . Depression   . GERD (gastroesophageal reflux disease)   . Headache(784.0)   . Inflammatory polyps of colon with rectal bleeding ([place]) 2008    rt colectomy for a polyp with carcinoma in situ   . Personal history of malignant neoplasm of large intestine    Past Surgical History:  Procedure Laterality Date  . ABDOMINAL HYSTERECTOMY  1996  . APPENDECTOMY    . COLECTOMY Right 2008  . COLONOSCOPY  2012   Dr. Jamal Collin  . COLONOSCOPY WITH PROPOFOL N/A 03/10/2016   Procedure: COLONOSCOPY WITH PROPOFOL;  Surgeon: [name], MD;  Location: [facility] ENDOSCOPY;  Service: Endoscopy;  Laterality: N/A;  . FOOT SURGERY Left 2012  . FOOT SURGERY Left 2013  . THYROGLOSSAL DUCT CYST N/A 09/04/2015   Procedure: THYROGLOSSAL DUCT CYST;  Surgeon: Carloyn Manner, MD;  Location: [facility] ORS;  Service: ENT;  Laterality: N/A;   Family History  Problem Relation Age of Onset  . Heart disease Mother   . Heart disease Father   . Drug abuse Brother   . Breast cancer Neg Hx    Social History   Socioeconomic History  . Marital status: Married    Spouse name: Lisa Caldwell  . Number of children: 2  . Years of education: Not on file  . Highest education level: 12th grade  Occupational History  . Occupation: Retired  Scientific laboratory technician  . Financial resource strain: Not hard at all  . Food insecurity  Worry: Never true    Inability: Never true  . Transportation needs    Medical: No    Non-medical: No  Tobacco Use  . Smoking status: Never Smoker  . Smokeless tobacco: Never Used  Substance and Sexual Activity  . Alcohol use: Yes    Alcohol/week: 10.0 standard drinks    Types: 10 Cans of beer per week    Comment: beer  . Drug use: No  . Sexual activity: Never    Birth control/protection: Other-see comments    Comment: hysterectomy  Lifestyle  . Physical activity    Days per week: 4 days    Minutes per session: 30 min  . Stress: Not at all  Relationships  . Social Product manager on phone: Patient refused    Gets together: Patient refused    Attends religious service: Patient refused    Active member of club or organization: Patient refused    Attends meetings of clubs or organizations: Patient refused    Relationship status: Patient refused  Other Topics Concern  . Not on file  Social History Narrative  . Not on file    Outpatient Encounter Medications as of 05/22/2019  Medication Sig  . aspirin 81 MG tablet Take 81 mg by mouth daily.  . Cholecalciferol (VITAMIN D3 PO) Take by mouth daily.   . citalopram (CELEXA) 20 MG tablet TAKE 1 TABLET EVERY DAY  . fluticasone (FLONASE) 50 MCG/ACT nasal spray USE 2 SPRAYS IN EACH NOSTRIL EVERY DAY (SUBSTITUTED FOR FLONASE)  . Multiple Vitamin (MULTIVITAMIN) tablet Take 1 tablet by mouth daily.  Marland Kitchen omeprazole (PRILOSEC) 20 MG capsule TAKE 1 CAPSULE TWICE DAILY AS DIRECTED BEFORE A MEAL  . vitamin B-12 (CYANOCOBALAMIN) 1000 MCG tablet Take 1,000 mcg by mouth daily.   No facility-administered encounter medications on file as of 05/22/2019.     Activities of Daily Living In your present state of health, do you have any difficulty performing the following activities: 05/22/2019  Hearing? N  Vision? Y  Comment Due to needing an updated a prescription.  Difficulty concentrating or making decisions? N  Walking or climbing stairs? N  Dressing or bathing? N  Doing errands, shopping? N  Preparing Food and eating ? N  Using the Toilet? N  In the past six months, have you accidently leaked urine? N  Do you have problems with loss of bowel control? N  Managing your Medications? N  Managing your Finances? N  Housekeeping or managing your Housekeeping? N  Some recent data might be hidden    Patient Care Team: Rubye Beach as PCP - General (Family Medicine)    Assessment:   This is a routine wellness examination for [name].  Exercise Activities and Dietary recommendations Current Exercise Habits: Home  exercise routine, Type of exercise: strength training/weights;Other - see comments(aerobics and taichi), Time (Minutes): 30, Frequency (Times/Week): 4, Weekly Exercise (Minutes/Week): 120, Intensity: Moderate, Exercise limited by: None identified  Goals    . Exercise 150 minutes per week (moderate activity)    . Reduce alcohol intake to 1 serving a day.        Fall Risk: Fall Risk  05/22/2019 05/17/2018 05/12/2017 05/13/2016 05/05/2015  Falls in the past year? 0 No No No No    FALL RISK PREVENTION PERTAINING TO THE HOME:  Any stairs in or around the home? Yes  If so, are there any without handrails? Yes   Home free of loose throw rugs in walkways, pet  beds, electrical cords, etc? Yes  Adequate lighting in your home to reduce risk of falls? Yes   ASSISTIVE DEVICES UTILIZED TO PREVENT FALLS:  Life alert? No  Use of a cane, walker or w/c? No  Grab bars in the bathroom? Yes  Shower chair or bench in shower? No  Elevated toilet seat or a handicapped toilet? Yes    TIMED UP AND GO:  Was the test performed? No .    Depression Screen PHQ 2/9 Scores 05/22/2019 05/17/2018 05/16/2017 05/12/2017  PHQ - 2 Score 0 1 0 0  PHQ- 9 Score - - 0 -     Cognitive Function: Declined today.      6CIT Screen 05/12/2017  What Year? 0 points  What month? 0 points  What time? 0 points  Count back from 20 0 points  Months in reverse 0 points  Repeat phrase 0 points  Total Score 0    Immunization History  Administered Date(s) Administered  . H1N1 09/25/2008  . Influenza Split 07/16/2010, 07/28/2011, 08/08/2012  . Influenza, High Dose Seasonal PF 07/16/2014, 07/19/2015, 07/09/2016, 07/21/2017, 07/19/2018  . Influenza,inj,Quad PF,6+ Mos 07/12/2013  . Pneumococcal Conjugate-13 05/05/2015  . Pneumococcal Polysaccharide-23 08/27/2011  . Tdap 01/11/2006  . Zoster 08/17/2011  . Zoster Recombinat (Shingrix) 05/16/2017, 07/21/2017    Qualifies for Shingles Vaccine? Completed series  Tdap: Although  this vaccine is not a covered service during a Wellness Exam, does the patient still wish to receive this vaccine today?  No .   Flu Vaccine: Due fall 2020  Pneumococcal Vaccine: Completed series  Screening Tests Health Maintenance  Topic Date Due  . INFLUENZA VACCINE  05/19/2019  . TETANUS/TDAP  10/18/2026 (Originally 01/12/2016)  . MAMMOGRAM  04/02/2020  . COLONOSCOPY  03/10/2021  . DEXA SCAN  04/05/2021  . Hepatitis C Screening  Completed  . PNA vac Low Risk Adult  Completed    Cancer Screenings:  Colorectal Screening: Completed 03/10/16. Repeat every 5 years.  Mammogram: Completed 04/03/19.   Bone Density: Completed 04/05/16. Results reflect  OSTEOPENIA. Repeat every 5 years.   Lung Cancer Screening: (Low Dose CT Chest recommended if Age 38-80 years, 30 pack-year currently smoking OR have quit w/in 15years.) does not qualify.   Additional Screening:  Hepatitis C Screening: Up to date  Vision Screening: Recommended annual ophthalmology exams for early detection of glaucoma and other disorders of the eye.  Dental Screening: Recommended annual dental exams for proper oral hygiene  Community Resource Referral:  CRR required this visit?  No       Plan:  I have personally reviewed and addressed the Medicare Annual Wellness questionnaire and have noted the following in the patient's chart:  A. Medical and social history B. Use of alcohol, tobacco or illicit drugs  C. Current medications and supplements D. Functional ability and status E.  Nutritional status F.  Physical activity G. Advance directives H. List of other physicians I.  Hospitalizations, surgeries, and ER visits in previous 12 months J.  [place] such as hearing and vision if needed, cognitive and depression L. Referrals and appointments   In addition, I have reviewed and discussed with patient certain preventive protocols, quality metrics, and best practice recommendations. A written  personalized care plan for preventive services as well as general preventive health recommendations were provided to patient. Nurse Health Advisor  Signed,    Nataliya Graig [place], [place]  6/0/6301 Nurse Health Advisor   Nurse Notes: None.

## 2019-05-23 ENCOUNTER — Telehealth: Payer: Self-pay

## 2019-05-23 LAB — COMPREHENSIVE METABOLIC PANEL
ALT: 23 IU/L (ref 0–32)
AST: 31 IU/L (ref 0–40)
Albumin/Globulin Ratio: 1.6 (ref 1.2–2.2)
Albumin: 4.1 g/dL (ref 3.7–4.7)
Alkaline Phosphatase: 63 IU/L (ref 39–117)
BUN/Creatinine Ratio: 14 (ref 12–28)
BUN: 10 mg/dL (ref 8–27)
Bilirubin Total: 0.4 mg/dL (ref 0.0–1.2)
CO2: 21 mmol/L (ref 20–29)
Calcium: 9.2 mg/dL (ref 8.7–10.3)
Chloride: 104 mmol/L (ref 96–106)
Creatinine, Ser: 0.74 mg/dL (ref 0.57–1.00)
GFR calc Af Amer: 93 mL/min/{1.73_m2} (ref >59)
GFR calc non Af Amer: 81 mL/min/{1.73_m2} (ref >59)
Globulin, Total: 2.5 g/dL (ref 1.5–4.5)
Glucose: 89 mg/dL (ref 65–99)
Potassium: 4.1 mmol/L (ref 3.5–5.2)
Sodium: 143 mmol/L (ref 134–144)
Total Protein: 6.6 g/dL (ref 6.0–8.5)

## 2019-05-23 LAB — HEMOGLOBIN A1C
Est. average glucose Bld gHb Est-mCnc: 97 mg/dL
Hgb A1c MFr Bld: 5 % (ref 4.8–5.6)

## 2019-05-23 LAB — CBC WITH DIFFERENTIAL/PLATELET
Basophils Absolute: 0.1 10*3/uL (ref 0.0–0.2)
Basos: 1 %
EOS (ABSOLUTE): 0.1 10*3/uL (ref 0.0–0.4)
Eos: 1 %
Hematocrit: 39.4 % (ref 34.0–46.6)
Hemoglobin: 13 g/dL (ref 11.1–15.9)
Immature Grans (Abs): 0 10*3/uL (ref 0.0–0.1)
Immature Granulocytes: 0 %
Lymphocytes Absolute: 1.8 10*3/uL (ref 0.7–3.1)
Lymphs: 27 %
MCH: 33.9 pg — ABNORMAL HIGH (ref 26.6–33.0)
MCHC: 33 g/dL (ref 31.5–35.7)
MCV: 103 fL — ABNORMAL HIGH (ref 79–97)
Monocytes Absolute: 0.5 10*3/uL (ref 0.1–0.9)
Monocytes: 8 %
Neutrophils Absolute: 4.1 10*3/uL (ref 1.4–7.0)
Neutrophils: 63 %
Platelets: 260 10*3/uL (ref 150–450)
RBC: 3.83 x10E6/uL (ref 3.77–5.28)
RDW: 12.1 % (ref 11.7–15.4)
WBC: 6.6 10*3/uL (ref 3.4–10.8)

## 2019-05-23 LAB — LIPID PANEL
Chol/HDL Ratio: 2.6 ratio (ref 0.0–4.4)
Cholesterol, Total: 217 mg/dL — ABNORMAL HIGH (ref 100–199)
HDL: 83 mg/dL (ref >39)
LDL Calculated: 121 mg/dL — ABNORMAL HIGH (ref 0–99)
Triglycerides: 64 mg/dL (ref 0–149)
VLDL Cholesterol Cal: 13 mg/dL (ref 5–40)

## 2019-05-23 LAB — TSH: TSH: 1.66 u[IU]/mL (ref 0.450–4.500)

## 2019-05-23 NOTE — Telephone Encounter (Signed)
-----   Message from Mar Daring, [place] sent at 05/23/2019  8:24 AM EDT ----- All labs are within normal limits and stable with exception of cholesterol. This has increased since last year. Make sure to eat a heart healthy diet with limiting fatty foods, red meats and sugars in diet.   Thanks! -JB

## 2019-05-23 NOTE — Telephone Encounter (Signed)
LMTCB

## 2019-05-24 NOTE — Telephone Encounter (Signed)
Viewed by [name] on 05/23/2019 9:02 AM Written by Mar Daring, PA-C on 05/23/2019 8:24 AM All labs are within normal limits and stable with exception of cholesterol. This has increased since last year. Make sure to eat a heart healthy diet with limiting fatty foods, red meats and sugars in diet.  Thanks! -JB

## 2019-07-03 DIAGNOSIS — H524 Presbyopia: Secondary | ICD-10-CM | POA: Diagnosis not present

## 2019-07-10 DIAGNOSIS — Z23 Encounter for immunization: Secondary | ICD-10-CM | POA: Diagnosis not present

## 2019-08-03 ENCOUNTER — Other Ambulatory Visit: Payer: Self-pay | Admitting: Physician Assistant

## 2019-08-03 DIAGNOSIS — K219 Gastro-esophageal reflux disease without esophagitis: Secondary | ICD-10-CM

## 2019-08-03 NOTE — Telephone Encounter (Signed)
Patient was notified of script being sent to requested pharmacy. She gave verbal understanding.

## 2019-09-04 ENCOUNTER — Telehealth: Payer: Self-pay | Admitting: Physician Assistant

## 2019-09-04 DIAGNOSIS — J309 Allergic rhinitis, unspecified: Secondary | ICD-10-CM

## 2019-09-04 DIAGNOSIS — K219 Gastro-esophageal reflux disease without esophagitis: Secondary | ICD-10-CM

## 2019-09-04 DIAGNOSIS — F4322 Adjustment disorder with anxiety: Secondary | ICD-10-CM

## 2019-09-04 NOTE — Telephone Encounter (Signed)
[facility] faxed refill request for the following medications:  omeprazole (PRILOSEC) 20 MG capsule citalopram (CELEXA) 20 MG tablet fluticasone (FLONASE) 50 MCG/ACT nasal spray   Thanks, [place]

## 2019-09-05 MED ORDER — OMEPRAZOLE 20 MG PO CPDR: 20.0000 mg | DELAYED_RELEASE_CAPSULE | Freq: Two times a day (BID) | ORAL | 1 refills | Status: DC

## 2019-09-05 MED ORDER — FLUTICASONE PROPIONATE 50 MCG/ACT NA SUSP: NASAL | 3 refills | Status: DC

## 2019-09-05 MED ORDER — CITALOPRAM HYDROBROMIDE 20 MG PO TABS: 20.0000 mg | ORAL_TABLET | Freq: Every day | ORAL | 1 refills | Status: DC

## 2019-09-11 ENCOUNTER — Telehealth: Payer: Self-pay

## 2019-09-11 NOTE — Telephone Encounter (Signed)
Spoke with patient. Already had the insurance info updated. UHC MDR.  cbe

## 2019-09-11 NOTE — Telephone Encounter (Signed)
Copied from [place] [id number]. Topic: General - Call Back - No Documentation >> Sep 10, 2019  4:55 PM Erick Blinks wrote: Reason for CRM: Pt called to update her insurance. She now has [employer], was unable to upload via telephone. Please advise  Best contact: [phone] >> Sep 11, 2019  2:53 PM Yvette Rack wrote: Pt stated she requested a return call regarding her new insurance but she has yet to hear from anyone. Pt requests call back

## 2019-09-11 NOTE — Telephone Encounter (Signed)
FYI..   Copied from [place] [id number]. Topic: General - Call Back - No Documentation >> Sep 10, 2019  4:55 PM Erick Blinks wrote: Reason for CRM: Pt called to update her insurance. She now has [employer], was unable to upload via telephone. Please advise  Best contact: [phone]

## 2019-11-02 ENCOUNTER — Encounter: Payer: Self-pay | Admitting: Physician Assistant

## 2019-11-15 ENCOUNTER — Ambulatory Visit (INDEPENDENT_AMBULATORY_CARE_PROVIDER_SITE_OTHER): Payer: Medicare Other | Admitting: Physician Assistant

## 2019-11-15 ENCOUNTER — Encounter: Payer: Self-pay | Admitting: Physician Assistant

## 2019-11-15 ENCOUNTER — Other Ambulatory Visit: Payer: Self-pay

## 2019-11-15 VITALS — BP 140/84 | HR 80 | Temp 97.8°F | Resp 16 | Wt 154.0 lb

## 2019-11-15 DIAGNOSIS — M1711 Unilateral primary osteoarthritis, right knee: Secondary | ICD-10-CM | POA: Diagnosis not present

## 2019-11-15 DIAGNOSIS — M25561 Pain in right knee: Secondary | ICD-10-CM | POA: Diagnosis not present

## 2019-11-15 DIAGNOSIS — M7121 Synovial cyst of popliteal space [Baker], right knee: Secondary | ICD-10-CM

## 2019-11-15 MED ORDER — METHYLPREDNISOLONE ACETATE 40 MG/ML IJ SUSP
40.0000 mg | Freq: Once | INTRAMUSCULAR | Status: AC
  Administered 2019-11-15: 40 mg via INTRA_ARTICULAR

## 2019-11-15 NOTE — Patient Instructions (Signed)
Knee Injection A knee injection is a procedure to get medicine into your knee joint to relieve the pain, swelling, and stiffness of arthritis. Your health care provider uses a needle to inject medicine, which may also help to lubricate and cushion your knee joint. You may need more than one injection. Tell a health care provider about:  Any allergies you have.  All medicines you are taking, including vitamins, herbs, eye drops, creams, and over-the-counter medicines.  Any problems you or family members have had with anesthetic medicines.  Any blood disorders you have.  Any surgeries you have had.  Any medical conditions you have.  Whether you are pregnant or may be pregnant. What are the risks? Generally, this is a safe procedure. However, problems may occur, including:  Infection.  Bleeding.  Symptoms that get worse.  Damage to the area around your knee.  Allergic reaction to any of the medicines.  Skin reactions from repeated injections. What happens before the procedure?  Ask your health care provider about changing or stopping your regular medicines. This is especially important if you are taking diabetes medicines or blood thinners.  Plan to have someone take you home from the hospital or clinic. What happens during the procedure?   You will sit or lie down in a position for your knee to be treated.  The skin over your kneecap will be cleaned with a germ-killing soap.  You will be given a medicine that numbs the area (local anesthetic). You may feel some stinging.  The medicine will be injected into your knee. The needle is carefully placed between your kneecap and your knee. The medicine is injected into the joint space.  The needle will be removed at the end of the procedure.  A bandage (dressing) may be placed over the injection site. The procedure may vary among health care providers and hospitals. What can I expect after the procedure?  Your blood  pressure, heart rate, breathing rate, and blood oxygen level will be monitored until you leave the hospital or clinic.  You may have to move your knee through its full range of motion. This helps to get all the medicine into your joint space.  You will be watched to make sure that you do not have a reaction to the injected medicine.  You may feel more pain, swelling, and warmth than you did before the injection. This reaction may last about 1-2 days. Follow these instructions at home: Medicines  Take over-the-counter and prescription medicines only as told by your doctor.  Do not drive or use heavy machinery while taking prescription pain medicine.  Do not take medicines such as aspirin and ibuprofen unless your health care provider tells you to take them. Injection site care  Follow instructions from your health care provider about: ? How to take care of your puncture site. ? When and how you should change your dressing. ? When you should remove your dressing.  Check your injection area every day for signs of infection. Check for: ? More redness, swelling, or pain after 2 days. ? Fluid or blood. ? Pus or a bad smell. ? Warmth. Managing pain, stiffness, and swelling   If directed, put ice on the injection area: ? Put ice in a plastic bag. ? Place a towel between your skin and the bag. ? Leave the ice on for 20 minutes, 2-3 times per day.  Do not apply heat to your knee.  Raise (elevate) the injection area above the level   of your heart while you are sitting or lying down. General instructions  If you were given a dressing, keep it dry until your health care provider says it can be removed. Ask your health care provider when you can start showering or taking a bath.  Avoid strenuous activities for as long as directed by your health care provider. Ask your health care provider when you can return to your normal activities.  Keep all follow-up visits as told by your health  care provider. This is important. You may need more injections. Contact a health care provider if you have:  A fever.  Warmth in your injection area.  Fluid, blood, or pus coming from your injection site.  Symptoms at your injection site that last longer than 2 days after your procedure. Get help right away if:  Your knee: ? Turns very red. ? Becomes very swollen. ? Is in severe pain. Summary  A knee injection is a procedure to get medicine into your knee joint to relieve the pain, swelling, and stiffness of arthritis.  A needle is carefully placed between your kneecap and your knee to inject medicine into the joint space.  Before the procedure, ask your health care provider about changing or stopping your regular medicines, especially if you are taking diabetes medicines or blood thinners.  Contact your health care provider if you have any problems or questions after your procedure. This information is not intended to replace advice given to you by your health care provider. Make sure you discuss any questions you have with your health care provider. Document Revised: 10/24/2017 Document Reviewed: 10/24/2017 Elsevier Patient Education  2020 Elsevier Inc.  

## 2019-11-15 NOTE — Progress Notes (Signed)
Patient: Lisa Caldwell Female    DOB: [birth year]   74 y.o.   MRN: [id number] Visit Date: 11/15/2019  Today's Provider: Mar Daring, PA-C   Chief Complaint  Patient presents with  . Knee Pain   Subjective:     Knee Pain  The incident occurred more than 1 week ago (3 weeks). There was no injury mechanism. The pain is present in the right knee (and a knot in the back of her knee). The quality of the pain is described as aching. The pain has been constant since onset. Pertinent negatives include no inability to bear weight, muscle weakness, numbness or tingling. She reports no foreign bodies present. The symptoms are aggravated by movement. She has tried NSAIDs (IBU) for the symptoms. The treatment provided no relief.    Allergies  Allergen Reactions  . Codeine Nausea And Vomiting  . Peanut-Containing Drug Products Diarrhea and Nausea And Vomiting    Pine nut  . Phenergan [Promethazine Hcl] Other (See Comments)    tremors     Current Outpatient Medications:  .  albuterol (VENTOLIN HFA) 108 (90 Base) MCG/ACT inhaler, Inhale 2 puffs into the lungs every 6 (six) hours as needed for wheezing or shortness of breath., Disp: 18 g, Rfl: 0 .  aspirin 81 MG tablet, Take 81 mg by mouth daily., Disp: , Rfl:  .  Cholecalciferol (VITAMIN D3 PO), Take by mouth daily. , Disp: , Rfl:  .  citalopram (CELEXA) 20 MG tablet, Take 1 tablet (20 mg total) by mouth daily., Disp: 90 tablet, Rfl: 1 .  fluticasone (FLONASE) 50 MCG/ACT nasal spray, Two sprays each nostril daily., Disp: 48 g, Rfl: 3 .  Multiple Vitamin (MULTIVITAMIN) tablet, Take 1 tablet by mouth daily., Disp: , Rfl:  .  omeprazole (PRILOSEC) 20 MG capsule, Take 1 capsule (20 mg total) by mouth 2 (two) times daily before a meal., Disp: 180 capsule, Rfl: 1 .  vitamin B-12 (CYANOCOBALAMIN) 1000 MCG tablet, Take 1,000 mcg by mouth daily., Disp: , Rfl:   Review of Systems  Constitutional: Negative.   Respiratory: Negative.     Cardiovascular: Negative.   Musculoskeletal: Positive for arthralgias and joint swelling.  Neurological: Negative for tingling and numbness.    Social History   Tobacco Use  . Smoking status: Never Smoker  . Smokeless tobacco: Never Used  Substance Use Topics  . Alcohol use: Yes    Alcohol/week: 10.0 standard drinks    Types: 10 Cans of beer per week    Comment: beer      Objective:   BP 140/84 (BP Location: Left Arm, Patient Position: Sitting, Cuff Size: Normal)   Pulse 80   Temp 97.8 F (36.6 C) (Temporal)   Resp 16   Wt 154 lb (69.9 kg)   BMI 26.43 kg/m  Vitals:   11/15/19 1607  BP: 140/84  Pulse: 80  Resp: 16  Temp: 97.8 F (36.6 C)  TempSrc: Temporal  Weight: 154 lb (69.9 kg)  Body mass index is 26.43 kg/m.   Physical Exam Vitals reviewed.  Constitutional:      General: She is not in acute distress.    Appearance: Normal appearance. She is well-developed. She is not ill-appearing.  HENT:     Head: Normocephalic and atraumatic.  Pulmonary:     Effort: Pulmonary effort is normal. No respiratory distress.  Musculoskeletal:     Cervical back: Normal range of motion and neck supple.     Right  knee: Swelling and crepitus (patellar) present. No effusion, ecchymosis or bony tenderness. Normal range of motion. Tenderness present over the medial joint line. No LCL laxity, MCL laxity, ACL laxity or PCL laxity. Normal alignment, normal meniscus and normal patellar mobility. Normal pulse.     Instability Tests: Negative medial McMurray test and negative lateral McMurray test.     Comments: Baker cyst palpated in right popliteal fossa  Neurological:     Mental Status: She is alert.  Psychiatric:        Mood and Affect: Mood normal.        Behavior: Behavior normal.        Thought Content: Thought content normal.        Judgment: Judgment normal.      No results found for any visits on 11/15/19.     Assessment & Plan    1. Acute pain of right knee OA  suspected. Will get imaging to prefer. Medrol given today in office, see procedure note, and tolerated well. After care instructions printed.  - DG Knee Complete 4 Views Right; Future - methylPREDNISolone acetate (DEPO-MEDROL) injection 40 mg  2. Primary osteoarthritis of right knee See above medical treatment plan. - methylPREDNISolone acetate (DEPO-MEDROL) injection 40 mg  3. Baker's cyst of knee, right See above medical treatment plan. - methylPREDNISolone acetate (DEPO-MEDROL) injection 40 mg  Procedure Note: Benefits, risks (including infection, tattooing, adipose dimpling, and tendon rupture) and alternatives were explained to the patient. All questions were sought and answered.  Patient agreed to continue and verbal consent was obtained.   An aspiration and steroid injection was performed on right knee using 4cc of 1% plain Xyloocaine and 40 mg of depo-medrol. There was minimal bleeding. Hemostasis was intact. A dry dressing was applied. The procedure was well tolerated.    Mar Daring, PA-C  [facility]

## 2019-11-19 ENCOUNTER — Other Ambulatory Visit: Payer: Self-pay

## 2019-11-19 ENCOUNTER — Ambulatory Visit
Admission: RE | Admit: 2019-11-19 | Discharge: 2019-11-19 | Disposition: A | Discharge status: Home / Self Care | Payer: Medicare Other | Source: Ambulatory Visit | Attending: Physician Assistant | Admitting: Physician Assistant

## 2019-11-19 ENCOUNTER — Ambulatory Visit
Admission: RE | Admit: 2019-11-19 | Discharge: 2019-11-19 | Disposition: A | Discharge status: Home / Self Care | Payer: Medicare Other | Attending: Physician Assistant | Admitting: Physician Assistant

## 2019-11-19 DIAGNOSIS — M25561 Pain in right knee: Secondary | ICD-10-CM | POA: Diagnosis not present

## 2019-11-20 ENCOUNTER — Telehealth: Payer: Self-pay

## 2019-11-20 DIAGNOSIS — M1711 Unilateral primary osteoarthritis, right knee: Secondary | ICD-10-CM

## 2019-11-20 NOTE — Telephone Encounter (Signed)
Comment seen by patient Lisa Caldwell on 11/20/2019 2:41 PM EST

## 2019-11-20 NOTE — Telephone Encounter (Signed)
-----   Message from Mar Daring, [place] sent at 11/20/2019  1:59 PM EST ----- Knee xray shows arthritic changes as we had expected. No other abnormalities noted.

## 2019-11-21 NOTE — Telephone Encounter (Signed)
Pt called in stated she seen her results on mychart for her xray but she wants to know what she needs to do now?   Please advise  Best number [phone]

## 2019-11-21 NOTE — Telephone Encounter (Signed)
Please advise 

## 2019-11-22 MED ORDER — MELOXICAM 7.5 MG PO TABS: 7.5000 mg | ORAL_TABLET | Freq: Every day | ORAL | 0 refills | Status: DC

## 2019-11-22 NOTE — Telephone Encounter (Signed)
Did the injection help at all?  If not she probably needs to go to ortho.  Also I can send in meloxicam to help discomfort in the meantime.

## 2019-11-22 NOTE — Telephone Encounter (Signed)
Patient advised as below. Patient reports she will try meloxicam first before going to ortho.

## 2019-11-22 NOTE — Addendum Note (Signed)
Addended by: Mar Daring on: 11/22/2019 08:08 AM   Modules accepted: Orders

## 2019-11-30 ENCOUNTER — Other Ambulatory Visit: Payer: Self-pay | Admitting: Physician Assistant

## 2019-11-30 DIAGNOSIS — F4322 Adjustment disorder with anxiety: Secondary | ICD-10-CM

## 2019-11-30 DIAGNOSIS — K219 Gastro-esophageal reflux disease without esophagitis: Secondary | ICD-10-CM

## 2019-11-30 NOTE — Telephone Encounter (Signed)
Requested Prescriptions  Pending Prescriptions Disp Refills  . omeprazole (PRILOSEC) 20 MG capsule [Pharmacy Med Name: OMEPRAZOLE  20MG   CAP] 180 capsule 3    Sig: TAKE 1 CAPSULE BY MOUTH  TWICE DAILY BEFORE MEALS     Gastroenterology: Proton Pump Inhibitors Passed - 11/30/2019 10:03 AM      Passed - Valid encounter within last 12 months    Recent Outpatient Visits          2 weeks ago Acute pain of right knee   [facility] [place], Clearnce Sorrel, PA-C   6 months ago Annual physical exam   [facility] [place], Clearnce Sorrel, [place]   1 year ago Annual physical exam   [facility] [place], Clearnce Sorrel, [place]   1 year ago [place], Clearnce Sorrel, [place]   2 years ago Need for influenza vaccination   [place], PA-C             . citalopram (CELEXA) 20 MG tablet [Pharmacy Med Name: CITALOPRAM  20MG   TAB] 90 tablet 1    Sig: TAKE 1 TABLET BY MOUTH  DAILY     Psychiatry:  Antidepressants - SSRI Passed - 11/30/2019 10:03 AM      Passed - Completed PHQ-2 or PHQ-9 in the last 360 days.      Passed - Valid encounter within last 6 months    Recent Outpatient Visits          2 weeks ago Acute pain of right knee   [facility] [place], Clearnce Sorrel, [place]   6 months ago Annual physical exam   [facility] [place], Clearnce Sorrel, [place]   1 year ago Annual physical exam   [place], Clearnce Sorrel, [place]   1 year ago [place], Clearnce Sorrel, [place]   2 years ago Need for influenza vaccination   [facility] [place], [place]

## 2019-12-08 ENCOUNTER — Ambulatory Visit: Payer: Medicare Other | Attending: Internal Medicine

## 2019-12-08 ENCOUNTER — Ambulatory Visit: Payer: Medicare Other

## 2019-12-08 DIAGNOSIS — Z23 Encounter for immunization: Secondary | ICD-10-CM | POA: Insufficient documentation

## 2019-12-08 NOTE — Progress Notes (Signed)
   Covid-19 Vaccination Clinic  Name:  Lisa Caldwell    MRN: [id number] DOB: December 22, 1945  12/08/2019  Lisa Caldwell was observed post Covid-19 immunization for 15 minutes without incidence. She was provided with Vaccine Information Sheet and instruction to access the V-Safe system.   Lisa Caldwell was instructed to call 911 with any severe reactions post vaccine: Marland Kitchen Difficulty breathing  . Swelling of your face and throat  . A fast heartbeat  . A bad rash all over your body  . Dizziness and weakness    Immunizations Administered    Name Date Dose VIS Date Route   Pfizer COVID-19 Vaccine 12/08/2019 12:36 PM 0.3 mL 09/28/2019 Intramuscular   Manufacturer: [place]   Lot: [id number]   [place]: [id number]

## 2019-12-15 ENCOUNTER — Other Ambulatory Visit: Payer: Self-pay | Admitting: Physician Assistant

## 2019-12-15 DIAGNOSIS — M1711 Unilateral primary osteoarthritis, right knee: Secondary | ICD-10-CM

## 2019-12-15 NOTE — Telephone Encounter (Signed)
Requested medications are due for refill today?  Yes  Requested medications are on active medication list?  Yes  Last Refill:  11/22/2019 # 30 with no refills  Future visit scheduled?  No     Notes to Clinic:  This was a new medication for this patient on [id number] and was uncertain if MD wanted to refill or refer to Ortho as indicated in patient's chart.  F/u with patient may be needed.

## 2019-12-17 NOTE — Telephone Encounter (Signed)
Will send in one more month but can we call patient and see if she is having pain still and if so does she desire ortho referral for further evaluation

## 2020-01-01 ENCOUNTER — Ambulatory Visit: Payer: Medicare Other | Attending: Internal Medicine

## 2020-01-01 DIAGNOSIS — Z23 Encounter for immunization: Secondary | ICD-10-CM

## 2020-01-01 NOTE — Progress Notes (Signed)
   Covid-19 Vaccination Clinic  Name:  Lisa Caldwell    MRN: [id number] DOB: 06/21/1946  01/01/2020  [name] was observed post Covid-19 immunization for 15 minutes without incident. She was provided with Vaccine Information Sheet and instruction to access the V-Safe system.   [name] was instructed to call 911 with any severe reactions post vaccine: Marland Kitchen Difficulty breathing  . Swelling of face and throat  . A fast heartbeat  . A bad rash all over body  . Dizziness and weakness   Immunizations Administered    Name Date Dose VIS Date Route   Pfizer COVID-19 Vaccine 01/01/2020 12:52 PM 0.3 mL 09/28/2019 Intramuscular   Manufacturer: [place]   Lot: [id number]   [place]: [id number]

## 2020-01-01 NOTE — Progress Notes (Signed)
   Covid-19 Vaccination Clinic  Name:  Lisa Caldwell    MRN: [id number] DOB: 1946/06/08  01/01/2020  Lisa Caldwell was observed post Covid-19 immunization for 15 minutes without incident. She was provided with Vaccine Information Sheet and instruction to access the V-Safe system.   Lisa Caldwell was instructed to call 911 with any severe reactions post vaccine: Marland Kitchen Difficulty breathing  . Swelling of face and throat  . A fast heartbeat  . A bad rash all over body  . Dizziness and weakness   Immunizations Administered    Name Date Dose VIS Date Route   Pfizer COVID-19 Vaccine 01/01/2020 12:52 PM 0.3 mL 09/28/2019 Intramuscular   Manufacturer: [place]   Lot: [id number]   [place]: [id number]

## 2020-01-10 ENCOUNTER — Other Ambulatory Visit: Payer: Self-pay | Admitting: Physician Assistant

## 2020-01-10 DIAGNOSIS — M1711 Unilateral primary osteoarthritis, right knee: Secondary | ICD-10-CM

## 2020-02-04 ENCOUNTER — Other Ambulatory Visit: Payer: Self-pay | Admitting: Physician Assistant

## 2020-02-04 DIAGNOSIS — M1711 Unilateral primary osteoarthritis, right knee: Secondary | ICD-10-CM

## 2020-02-13 ENCOUNTER — Other Ambulatory Visit: Payer: Self-pay | Admitting: Physician Assistant

## 2020-02-13 DIAGNOSIS — M1711 Unilateral primary osteoarthritis, right knee: Secondary | ICD-10-CM

## 2020-02-19 ENCOUNTER — Other Ambulatory Visit: Payer: Self-pay | Admitting: Physician Assistant

## 2020-02-19 DIAGNOSIS — R921 Mammographic calcification found on diagnostic imaging of breast: Secondary | ICD-10-CM

## 2020-03-23 ENCOUNTER — Other Ambulatory Visit: Payer: Self-pay | Admitting: Physician Assistant

## 2020-03-23 DIAGNOSIS — M1711 Unilateral primary osteoarthritis, right knee: Secondary | ICD-10-CM

## 2020-03-23 NOTE — Telephone Encounter (Signed)
Requested Prescriptions  Pending Prescriptions Disp Refills  . meloxicam (MOBIC) 7.5 MG tablet [Pharmacy Med Name: MELOXICAM 7.5 MG TABLET] 60 tablet 0    Sig: TAKE 1 TABLET BY MOUTH EVERY DAY     Analgesics:  COX2 Inhibitors Passed - 03/23/2020  1:08 AM      Passed - HGB in normal range and within 360 days    Hemoglobin  Date Value Ref Range Status  05/22/2019 13.0 11.1 - 15.9 g/dL Final         Passed - Cr in normal range and within 360 days    Creatinine  Date Value Ref Range Status  11/29/2011 0.63 0.60 - 1.30 mg/dL Final   Creatinine, Ser  Date Value Ref Range Status  05/22/2019 0.74 0.57 - 1.00 mg/dL Final         Passed - Patient is not pregnant      Passed - Valid encounter within last 12 months    Recent Outpatient Visits          4 months ago Acute pain of right knee   [place], Clearnce Sorrel, PA-C   10 months ago Annual physical exam   [place], Clearnce Sorrel, [place]   1 year ago Annual physical exam   [place], Clearnce Sorrel, [place]   1 year ago [place], Clearnce Sorrel, [place]   2 years ago Need for influenza vaccination   [place], [place], [place]

## 2020-04-04 ENCOUNTER — Ambulatory Visit
Admission: RE | Admit: 2020-04-04 | Discharge: 2020-04-04 | Disposition: A | Discharge status: Home / Self Care | Payer: Medicare Other | Source: Ambulatory Visit | Attending: Physician Assistant | Admitting: Physician Assistant

## 2020-04-04 DIAGNOSIS — R921 Mammographic calcification found on diagnostic imaging of breast: Secondary | ICD-10-CM | POA: Insufficient documentation

## 2020-04-16 ENCOUNTER — Telehealth: Payer: Self-pay

## 2020-04-16 DIAGNOSIS — M1711 Unilateral primary osteoarthritis, right knee: Secondary | ICD-10-CM

## 2020-04-16 DIAGNOSIS — M7121 Synovial cyst of popliteal space [Baker], right knee: Secondary | ICD-10-CM

## 2020-04-16 NOTE — Telephone Encounter (Signed)
Copied from [place] [id number]. Topic: Referral - Request for Referral >> Apr 15, 2020  5:04 PM Erick Blinks wrote: Has patient seen PCP for this complaint? Yes.   *If NO, is insurance requiring patient see PCP for this issue before PCP can refer them? Referral for which specialty: Ortho Preferred provider/office: [facility] Reason for referral: knee complications

## 2020-04-16 NOTE — Telephone Encounter (Signed)
Referral placed.

## 2020-04-23 ENCOUNTER — Telehealth: Payer: Self-pay

## 2020-04-23 NOTE — Telephone Encounter (Signed)
Copied from [place] [id number]. Topic: General - Call Back - No Documentation >> Apr 23, 2020  9:34 AM Jaynie Collins D wrote: Reason for CRM: Patient states [facility] in [place] is requesting x-ray's of right knee that were done previously. Please contact patient with resolution.

## 2020-04-23 NOTE — Telephone Encounter (Signed)
Can either of you fax a copy of patients right knee x ray report that was done on 11/19/2019 to [facility]? Tawanna Sat recently referred patient to [facility] and they are requesting copies of her last knee x ray. Thanks.

## 2020-04-24 NOTE — Telephone Encounter (Signed)
Contacted [facility] to see what records they were needing b/c usually records are sent by referral coordinator with the referral. They need pt's X-ray images on a disc. I advised that we do not have the ability to put the images on the disc and pt would need to contact [facility] where the X-rays were done and they can put the images on a disc for pt. I also, contacted pt and advised her as well. I provided the phone # to [facility] to pt. [phone]) Pt voiced understanding. TNP

## 2020-05-22 NOTE — Progress Notes (Signed)
Subjective:   Lisa Caldwell is a 74 y.o. female who presents for Medicare Annual (Subsequent) preventive examination.  Review of Systems    N/A  Cardiac Risk Factors include: advanced age (>59men, >41 women)     Objective:    Today's Vitals   05/26/20 0852  BP: 136/72  Pulse: 84  Temp: 98.5 F (36.9 C)  TempSrc: Oral  SpO2: 97%  Weight: 150 lb 9.6 oz (68.3 kg)  Height: 5\' 4"  (1.626 m)  PainSc: 0-No pain   Body mass index is 25.85 kg/m.  Advanced Directives 05/26/2020 05/22/2019 05/17/2018 05/12/2017 09/04/2015 08/21/2015 05/05/2015  Does Patient Have a Medical Advance Directive? Yes Yes Yes No No No No  Type of Paramedic of [place];Living will [place];Living will [place];Living will - - - -  Copy of [place] in Chart? No - copy requested No - copy requested No - copy requested - - - -  Would patient like information on creating a medical advance directive? - - - No - Patient declined Yes - Educational materials given Yes - Educational materials given -    Current Medications (verified) Outpatient Encounter Medications as of 05/26/2020  Medication Sig  . albuterol (VENTOLIN HFA) 108 (90 Base) MCG/ACT inhaler Inhale 2 puffs into the lungs every 6 (six) hours as needed for wheezing or shortness of breath.  Marland Kitchen aspirin 81 MG tablet Take 81 mg by mouth daily.  . Cholecalciferol (VITAMIN D3 PO) Take by mouth daily.   . citalopram (CELEXA) 20 MG tablet TAKE 1 TABLET BY MOUTH  DAILY  . diphenhydrAMINE (BENADRYL) 25 MG tablet Take 25 mg by mouth daily.  . fluticasone (FLONASE) 50 MCG/ACT nasal spray Two sprays each nostril daily.  . Multiple Vitamin (MULTIVITAMIN) tablet Take 1 tablet by mouth daily.  Marland Kitchen omeprazole (PRILOSEC) 20 MG capsule TAKE 1 CAPSULE BY MOUTH  TWICE DAILY BEFORE MEALS  . vitamin B-12 (CYANOCOBALAMIN) 1000 MCG tablet Take 1,000 mcg by mouth daily.  . meloxicam (MOBIC) 7.5 MG tablet  TAKE 1 TABLET BY MOUTH EVERY DAY   No facility-administered encounter medications on file as of 05/26/2020.    Allergies (verified) Codeine, Peanut-containing drug products, and Phenergan [promethazine hcl]   History: Past Medical History:  Diagnosis Date  . Allergy   . Anxiety   . Cancer [facility])    colon cancer  . Depression   . GERD (gastroesophageal reflux disease)   . Headache(784.0)   . Inflammatory polyps of colon with rectal bleeding ([place]) 2008    rt colectomy for a polyp with carcinoma in situ   . Personal history of malignant neoplasm of large intestine    Past Surgical History:  Procedure Laterality Date  . ABDOMINAL HYSTERECTOMY  1996  . APPENDECTOMY    . COLECTOMY Right 2008  . COLONOSCOPY  2012   Dr. Jamal Collin  . COLONOSCOPY WITH PROPOFOL N/A 03/10/2016   Procedure: COLONOSCOPY WITH PROPOFOL;  Surgeon: [name], MD;  Location: [facility] ENDOSCOPY;  Service: Endoscopy;  Laterality: N/A;  . FOOT SURGERY Left 2012  . FOOT SURGERY Left 2013  . THYROGLOSSAL DUCT CYST N/A 09/04/2015   Procedure: THYROGLOSSAL DUCT CYST;  Surgeon: Carloyn Manner, MD;  Location: [facility] ORS;  Service: ENT;  Laterality: N/A;   Family History  Problem Relation Age of Onset  . Heart disease Mother   . Heart disease Father   . Drug abuse Brother   . Breast cancer Neg Hx  Social History   Socioeconomic History  . Marital status: Married    Spouse name: Gershon Mussel  . Number of children: 2  . Years of education: Not on file  . Highest education level: 12th grade  Occupational History  . Occupation: Retired  Tobacco Use  . Smoking status: Never Smoker  . Smokeless tobacco: Never Used  Substance and Sexual Activity  . Alcohol use: Yes    Alcohol/week: 14.0 standard drinks    Types: 14 Cans of beer per week    Comment: 2 a day  . Drug use: No  . Sexual activity: Never    Birth control/protection: Other-see comments    Comment: hysterectomy  Other Topics Concern  . Not on file    Social History Narrative  . Not on file   Social Determinants of Health   Financial Resource Strain: Low Risk   . Difficulty of Paying Living Expenses: Not hard at all  Food Insecurity: No Food Insecurity  . Worried About Charity fundraiser in the Last Year: Never true  . Ran Out of Food in the Last Year: Never true  Transportation Needs: No Transportation Needs  . Lack of Transportation (Medical): No  . Lack of Transportation (Non-Medical): No  Physical Activity: Sufficiently Active  . Days of Exercise per Week: 5 days  . Minutes of Exercise per Session: 30 min  Stress: No Stress Concern Present  . Feeling of Stress : Not at all  Social Connections: Moderately Isolated  . Frequency of Communication with Friends and Family: Three times a week  . Frequency of Social Gatherings with Friends and Family: More than three times a week  . Attends Religious Services: Never  . Active Member of Clubs or Organizations: No  . Attends Archivist Meetings: Never  . Marital Status: Married    Tobacco Counseling Counseling given: Not Answered   Clinical Intake:  Pre-visit preparation completed: Yes  Pain : No/denies pain Pain Score: 0-No pain     Nutritional Status: BMI 25 -29 Overweight Nutritional Risks: None Diabetes: No  How often do you need to have someone help you when you read instructions, pamphlets, or other written materials from your doctor or pharmacy?: 1 - Never  Diabetic? No  Interpreter Needed?: No  Information entered by :: [facility], LPN   Activities of Daily Living In your present state of health, do you have any difficulty performing the following activities: 05/26/2020  Hearing? N  Vision? N  Comment Wears eyeglasses.  Difficulty concentrating or making decisions? N  Walking or climbing stairs? N  Dressing or bathing? N  Doing errands, shopping? N  Preparing Food and eating ? N  Using the Toilet? N  In the past six months, have you  accidently leaked urine? N  Do you have problems with loss of bowel control? N  Managing your Medications? N  Managing your Finances? N  Housekeeping or managing your Housekeeping? N  Some recent data might be hidden    Patient Care Team: Rubye Beach as PCP - General (Family Medicine)  Indicate any recent Medical Services you may have received from other than [facility] providers in the past year (date may be approximate).     Assessment:   This is a routine wellness examination for [place].  Hearing/Vision screen No exam data present  Dietary issues and exercise activities discussed: Current Exercise Habits: Structured exercise class, Type of exercise: strength training/weights (aerobics), Time (Minutes): 30, Frequency (Times/Week): 5, Weekly Exercise (  Minutes/Week): 150, Intensity: Mild, Exercise limited by: None identified  Goals    . Prevent falls     Recommend to remove any items from the home that may cause slips or trips.    . Reduce alcohol intake to 1 serving a day.       Depression Screen PHQ 2/9 Scores 11/15/2019 05/22/2019 05/17/2018 05/16/2017 05/12/2017 05/13/2016 05/05/2015  PHQ - 2 Score 1 0 1 0 0 0 0  PHQ- 9 Score 2 - - 0 - - -    Fall Risk Fall Risk  05/26/2020 11/15/2019 05/22/2019 05/17/2018 05/12/2017  Falls in the past year? 0 0 0 No No  Number falls in past yr: 0 0 - - -  Injury with Fall? 0 0 - - -  Follow up Falls prevention discussed Falls evaluation completed - - -    Any stairs in or around the home? No  If so, are there any without handrails? N/A Home free of loose throw rugs in walkways, pet beds, electrical cords, etc? Yes  Adequate lighting in your home to reduce risk of falls? Yes   ASSISTIVE DEVICES UTILIZED TO PREVENT FALLS:  Life alert? No  Use of a cane, walker or w/c? No  Grab bars in the bathroom? Yes  Shower chair or bench in shower? No  Elevated toilet seat or a handicapped toilet? Yes   TIMED UP AND GO:  Was the test  performed? Yes .  Length of time to ambulate 10 feet: 8 sec.   Gait steady and fast without use of assistive device  Cognitive Function:     6CIT Screen 05/26/2020 05/12/2017  What Year? 0 points 0 points  What month? 0 points 0 points  What time? 0 points 0 points  Count back from 20 0 points 0 points  Months in reverse 0 points 0 points  Repeat phrase 0 points 0 points  Total Score 0 0    Immunizations Immunization History  Administered Date(s) Administered  . H1N1 09/25/2008  . Influenza Split 07/16/2010, 07/28/2011, 08/08/2012  . Influenza, High Dose Seasonal PF 07/16/2014, 07/19/2015, 07/09/2016, 07/21/2017, 07/19/2018  . Influenza,inj,Quad PF,6+ Mos 07/12/2013  . PFIZER SARS-COV-2 Vaccination 12/08/2019, 01/01/2020  . Pneumococcal Conjugate-13 05/05/2015  . Pneumococcal Polysaccharide-23 08/27/2011  . Tdap 01/11/2006  . Zoster 08/17/2011  . Zoster Recombinat (Shingrix) 05/16/2017, 07/21/2017    TDAP status: Due, Education has been provided regarding the importance of this vaccine. Advised may receive this vaccine at local pharmacy or Health Dept. Aware to provide a copy of the vaccination record if obtained from local pharmacy or Health Dept. Verbalized acceptance and understanding. Flu Vaccine status: Up to date Pneumococcal vaccine status: Up to date Covid-19 vaccine status: Completed vaccines  Qualifies for Shingles Vaccine? Yes   Zostavax completed Yes   Shingrix Completed?: Yes  Screening Tests Health Maintenance  Topic Date Due  . INFLUENZA VACCINE  05/18/2020  . TETANUS/TDAP  10/18/2026 (Originally 01/12/2016)  . COLONOSCOPY  03/10/2021  . MAMMOGRAM  04/04/2021  . DEXA SCAN  04/05/2021  . COVID-19 Vaccine  Completed  . Hepatitis C Screening  Completed  . PNA vac Low Risk Adult  Completed    Health Maintenance  Health Maintenance Due  Topic Date Due  . INFLUENZA VACCINE  05/18/2020    Colorectal cancer screening: Completed 03/10/16. Repeat every 5  years Mammogram status: Completed 04/04/20. Repeat every year Bone Density status: Completed 04/05/16. Results reflect: Bone density results: OSTEOPENIA. Repeat every 5 years.  Lung Cancer  Screening: (Low Dose CT Chest recommended if Age 34-80 years, 30 pack-year currently smoking OR have quit w/in 15years.) does not qualify.   Additional Screening:  Hepatitis C Screening: Up to date  Vision Screening: Recommended annual ophthalmology exams for early detection of glaucoma and other disorders of the eye. Is the patient up to date with their annual eye exam?  Yes  Who is the provider or what is the name of the office in which the patient attends annual eye exams? [facility] in [place] If pt is not established with a provider, would they like to be referred to a provider to establish care? No .   Dental Screening: Recommended annual dental exams for proper oral hygiene  Community Resource Referral / Chronic Care Management: CRR required this visit?  No   CCM required this visit?  No      Plan:     I have personally reviewed and noted the following in the patient's chart:   . Medical and social history . Use of alcohol, tobacco or illicit drugs  . Current medications and supplements . Functional ability and status . Nutritional status . Physical activity . Advanced directives . List of other physicians . Hospitalizations, surgeries, and ER visits in previous 12 months . Vitals . Screenings to include cognitive, depression, and falls . Referrals and appointments  In addition, I have reviewed and discussed with patient certain preventive protocols, quality metrics, and best practice recommendations. A written personalized care plan for preventive services as well as general preventive health recommendations were provided to patient.     Charmayne Odell [place], [place]   0/0/1749   Nurse Notes: None.

## 2020-05-26 ENCOUNTER — Other Ambulatory Visit: Payer: Self-pay

## 2020-05-26 ENCOUNTER — Ambulatory Visit (INDEPENDENT_AMBULATORY_CARE_PROVIDER_SITE_OTHER): Payer: Medicare Other | Admitting: Physician Assistant

## 2020-05-26 ENCOUNTER — Encounter: Payer: Self-pay | Admitting: Physician Assistant

## 2020-05-26 ENCOUNTER — Ambulatory Visit (INDEPENDENT_AMBULATORY_CARE_PROVIDER_SITE_OTHER): Payer: Medicare Other

## 2020-05-26 VITALS — BP 136/72 | HR 84 | Temp 98.5°F | Ht 64.0 in | Wt 150.6 lb

## 2020-05-26 DIAGNOSIS — Z0000 Encounter for general adult medical examination without abnormal findings: Secondary | ICD-10-CM

## 2020-05-26 DIAGNOSIS — Z136 Encounter for screening for cardiovascular disorders: Secondary | ICD-10-CM | POA: Diagnosis not present

## 2020-05-26 DIAGNOSIS — Z13220 Encounter for screening for lipoid disorders: Secondary | ICD-10-CM | POA: Diagnosis not present

## 2020-05-26 DIAGNOSIS — F3342 Major depressive disorder, recurrent, in full remission: Secondary | ICD-10-CM | POA: Diagnosis not present

## 2020-05-26 NOTE — Patient Instructions (Signed)
Lisa Caldwell , Thank you for taking time to come for your Medicare Wellness Visit. I appreciate your ongoing commitment to your health goals. Please review the following plan we discussed and let me know if I can assist you in the future.   Screening recommendations/referrals: Colonoscopy: Up to date, due 02/2021 Mammogram: Up to date, due 03/2021 Bone Density: Up to date, due 03/2021 Recommended yearly ophthalmology/optometry visit for glaucoma screening and checkup Recommended yearly dental visit for hygiene and checkup  Vaccinations: Influenza vaccine: Done 06/2019 Pneumococcal vaccine: Completed series Tdap vaccine: Currently due, declined today. Shingles vaccine: Completed series    Advanced directives: Please bring a copy of your POA (Power of Attorney) and/or Living Will to your next appointment.   Conditions/risks identified: Fall risk preventatives discussed today.  Next appointment: 9:20 AM today with [place] 31 Years and Older, Female Preventive care refers to lifestyle choices and visits with your health care provider that can promote health and wellness. What does preventive care include?  A yearly physical exam. This is also called an annual well check.  Dental exams once or twice a year.  Routine eye exams. Ask your health care provider how often you should have your eyes checked.  Personal lifestyle choices, including:  Daily care of your teeth and gums.  Regular physical activity.  Eating a healthy diet.  Avoiding tobacco and drug use.  Limiting alcohol use.  Practicing safe sex.  Taking low-dose aspirin every day.  Taking vitamin and mineral supplements as recommended by your health care provider. What happens during an annual well check? The services and screenings done by your health care provider during your annual well check will depend on your age, overall health, lifestyle risk factors, and family history of  disease. Counseling  Your health care provider may ask you questions about your:  Alcohol use.  Tobacco use.  Drug use.  Emotional well-being.  Home and relationship well-being.  Sexual activity.  Eating habits.  History of falls.  Memory and ability to understand (cognition).  Work and work Statistician.  Reproductive health. Screening  You may have the following tests or measurements:  Height, weight, and BMI.  Blood pressure.  Lipid and cholesterol levels. These may be checked every 5 years, or more frequently if you are over 32 years old.  Skin check.  Lung cancer screening. You may have this screening every year starting at age 69 if you have a 30-pack-year history of smoking and currently smoke or have quit within the past 15 years.  Fecal occult blood test (FOBT) of the stool. You may have this test every year starting at age 13.  Flexible sigmoidoscopy or colonoscopy. You may have a sigmoidoscopy every 5 years or a colonoscopy every 10 years starting at age 7.  Hepatitis C blood test.  Hepatitis B blood test.  Sexually transmitted disease (STD) testing.  Diabetes screening. This is done by checking your blood sugar (glucose) after you have not eaten for a while (fasting). You may have this done every 1-3 years.  Bone density scan. This is done to screen for osteoporosis. You may have this done starting at age 97.  Mammogram. This may be done every 1-2 years. Talk to your health care provider about how often you should have regular mammograms. Talk with your health care provider about your test results, treatment options, and if necessary, the need for more tests. Vaccines  Your health care provider may recommend certain vaccines,  such as:  Influenza vaccine. This is recommended every year.  Tetanus, diphtheria, and acellular pertussis (Tdap, Td) vaccine. You may need a Td booster every 10 years.  Zoster vaccine. You may need this after age  36.  Pneumococcal 13-valent conjugate (PCV13) vaccine. One dose is recommended after age 64.  Pneumococcal polysaccharide (PPSV23) vaccine. One dose is recommended after age 89. Talk to your health care provider about which screenings and vaccines you need and how often you need them. This information is not intended to replace advice given to you by your health care provider. Make sure you discuss any questions you have with your health care provider. Document Released: 10/31/2015 Document Revised: 06/23/2016 Document Reviewed: 08/05/2015 Elsevier Interactive Patient Education  2017 [place] Prevention in the Home Falls can cause injuries. They can happen to people of all ages. There are many things you can do to make your home safe and to help prevent falls. What can I do on the outside of my home?  Regularly fix the edges of walkways and driveways and fix any cracks.  Remove anything that might make you trip as you walk through a door, such as a raised step or threshold.  Trim any bushes or trees on the path to your home.  Use bright outdoor lighting.  Clear any walking paths of anything that might make someone trip, such as rocks or tools.  Regularly check to see if handrails are loose or broken. Make sure that both sides of any steps have handrails.  Any raised decks and porches should have guardrails on the edges.  Have any leaves, snow, or ice cleared regularly.  Use sand or salt on walking paths during winter.  Clean up any spills in your garage right away. This includes oil or grease spills. What can I do in the bathroom?  Use night lights.  Install grab bars by the toilet and in the tub and shower. Do not use towel bars as grab bars.  Use non-skid mats or decals in the tub or shower.  If you need to sit down in the shower, use a plastic, non-slip stool.  Keep the floor dry. Clean up any water that spills on the floor as soon as it happens.  Remove  soap buildup in the tub or shower regularly.  Attach bath mats securely with double-sided non-slip rug tape.  Do not have throw rugs and other things on the floor that can make you trip. What can I do in the bedroom?  Use night lights.  Make sure that you have a light by your bed that is easy to reach.  Do not use any sheets or blankets that are too big for your bed. They should not hang down onto the floor.  Have a firm chair that has side arms. You can use this for support while you get dressed.  Do not have throw rugs and other things on the floor that can make you trip. What can I do in the kitchen?  Clean up any spills right away.  Avoid walking on wet floors.  Keep items that you use a lot in easy-to-reach places.  If you need to reach something above you, use a strong step stool that has a grab bar.  Keep electrical cords out of the way.  Do not use floor polish or wax that makes floors slippery. If you must use wax, use non-skid floor wax.  Do not have throw rugs and other things on  the floor that can make you trip. What can I do with my stairs?  Do not leave any items on the stairs.  Make sure that there are handrails on both sides of the stairs and use them. Fix handrails that are broken or loose. Make sure that handrails are as long as the stairways.  Check any carpeting to make sure that it is firmly attached to the stairs. Fix any carpet that is loose or worn.  Avoid having throw rugs at the top or bottom of the stairs. If you do have throw rugs, attach them to the floor with carpet tape.  Make sure that you have a light switch at the top of the stairs and the bottom of the stairs. If you do not have them, ask someone to add them for you. What else can I do to help prevent falls?  Wear shoes that:  Do not have high heels.  Have rubber bottoms.  Are comfortable and fit you well.  Are closed at the toe. Do not wear sandals.  If you use a  stepladder:  Make sure that it is fully opened. Do not climb a closed stepladder.  Make sure that both sides of the stepladder are locked into place.  Ask someone to hold it for you, if possible.  Clearly mark and make sure that you can see:  Any grab bars or handrails.  First and last steps.  Where the edge of each step is.  Use tools that help you move around (mobility aids) if they are needed. These include:  Canes.  Walkers.  Scooters.  Crutches.  Turn on the lights when you go into a dark area. Replace any light bulbs as soon as they burn out.  Set up your furniture so you have a clear path. Avoid moving your furniture around.  If any of your floors are uneven, fix them.  If there are any pets around you, be aware of where they are.  Review your medicines with your doctor. Some medicines can make you feel dizzy. This can increase your chance of falling. Ask your doctor what other things that you can do to help prevent falls. This information is not intended to replace advice given to you by your health care provider. Make sure you discuss any questions you have with your health care provider. Document Released: 07/31/2009 Document Revised: 03/11/2016 Document Reviewed: 11/08/2014 Elsevier Interactive Patient Education  2017 [employer].

## 2020-05-26 NOTE — Progress Notes (Signed)
Complete physical exam   Patient: Lisa Caldwell   DOB: June 29, 1946   74 y.o. Female  MRN: [id number] Visit Date: 05/26/2020  Today's healthcare provider: Mar Daring, PA-C   Chief Complaint  Patient presents with  . Annual Exam   Subjective    Lisa Caldwell is a 74 y.o. female who presents today for a complete physical exam.  She reports consuming a general diet. Gym/ health club routine includes light weights and aerobics. She generally feels well. She reports sleeping well. She does not have additional problems to discuss today.  HPI  Patient had AWV with [facility] today.  Past Medical History:  Diagnosis Date  . Allergy   . Anxiety   . Cancer [facility])    colon cancer  . Depression   . GERD (gastroesophageal reflux disease)   . Headache(784.0)   . Inflammatory polyps of colon with rectal bleeding ([place]) 2008    rt colectomy for a polyp with carcinoma in situ   . Personal history of malignant neoplasm of large intestine    Past Surgical History:  Procedure Laterality Date  . ABDOMINAL HYSTERECTOMY  1996  . APPENDECTOMY    . COLECTOMY Right 2008  . COLONOSCOPY  2012   Dr. Jamal Collin  . COLONOSCOPY WITH PROPOFOL N/A 03/10/2016   Procedure: COLONOSCOPY WITH PROPOFOL;  Surgeon: [name], MD;  Location: [facility] ENDOSCOPY;  Service: Endoscopy;  Laterality: N/A;  . FOOT SURGERY Left 2012  . FOOT SURGERY Left 2013  . THYROGLOSSAL DUCT CYST N/A 09/04/2015   Procedure: THYROGLOSSAL DUCT CYST;  Surgeon: Carloyn Manner, MD;  Location: [facility] ORS;  Service: ENT;  Laterality: N/A;   Social History   Socioeconomic History  . Marital status: Married    Spouse name: Gershon Mussel  . Number of children: 2  . Years of education: Not on file  . Highest education level: 12th grade  Occupational History  . Occupation: Retired  Tobacco Use  . Smoking status: Never Smoker  . Smokeless tobacco: Never Used  Substance and Sexual Activity  . Alcohol use: Yes    Alcohol/week: 14.0  standard drinks    Types: 14 Cans of beer per week    Comment: 2 a day  . Drug use: No  . Sexual activity: Never    Birth control/protection: Other-see comments    Comment: hysterectomy  Other Topics Concern  . Not on file  Social History Narrative  . Not on file   Social Determinants of Health   Financial Resource Strain: Low Risk   . Difficulty of Paying Living Expenses: Not hard at all  Food Insecurity: No Food Insecurity  . Worried About Charity fundraiser in the Last Year: Never true  . Ran Out of Food in the Last Year: Never true  Transportation Needs: No Transportation Needs  . Lack of Transportation (Medical): No  . Lack of Transportation (Non-Medical): No  Physical Activity: Sufficiently Active  . Days of Exercise per Week: 5 days  . Minutes of Exercise per Session: 30 min  Stress: No Stress Concern Present  . Feeling of Stress : Not at all  Social Connections: Moderately Isolated  . Frequency of Communication with Friends and Family: Three times a week  . Frequency of Social Gatherings with Friends and Family: More than three times a week  . Attends Religious Services: Never  . Active Member of Clubs or Organizations: No  . Attends Archivist Meetings: Never  . Marital  Status: Married  Human resources officer Violence: Not At Risk  . Fear of Current or Ex-Partner: No  . Emotionally Abused: No  . Physically Abused: No  . Sexually Abused: No   Family Status  Relation Name Status  . Mother  Deceased at age 45  . Father  Other  . Brother  Deceased at age 32       heart disease  . Neg Hx  (Not Specified)   Family History  Problem Relation Age of Onset  . Heart disease Mother   . Heart disease Father   . Drug abuse Brother   . Breast cancer Neg Hx    Allergies  Allergen Reactions  . Codeine Nausea And Vomiting  . Peanut-Containing Drug Products Diarrhea and Nausea And Vomiting    Pine nut  . Phenergan [Promethazine Hcl] Other (See Comments)     tremors    Patient Care Team: Mar Daring, PA-C as PCP - General (Family Medicine)   Medications: Outpatient Medications Prior to Visit  Medication Sig  . albuterol (VENTOLIN HFA) 108 (90 Base) MCG/ACT inhaler Inhale 2 puffs into the lungs every 6 (six) hours as needed for wheezing or shortness of breath.  Marland Kitchen aspirin 81 MG tablet Take 81 mg by mouth daily.  . Cholecalciferol (VITAMIN D3 PO) Take by mouth daily.   . citalopram (CELEXA) 20 MG tablet TAKE 1 TABLET BY MOUTH  DAILY  . diphenhydrAMINE (BENADRYL) 25 MG tablet Take 25 mg by mouth daily.  . fluticasone (FLONASE) 50 MCG/ACT nasal spray Two sprays each nostril daily.  . meloxicam (MOBIC) 7.5 MG tablet TAKE 1 TABLET BY MOUTH EVERY DAY  . Multiple Vitamin (MULTIVITAMIN) tablet Take 1 tablet by mouth daily.  Marland Kitchen omeprazole (PRILOSEC) 20 MG capsule TAKE 1 CAPSULE BY MOUTH  TWICE DAILY BEFORE MEALS  . vitamin B-12 (CYANOCOBALAMIN) 1000 MCG tablet Take 1,000 mcg by mouth daily.   No facility-administered medications prior to visit.    Review of Systems  Constitutional: Negative.   HENT: Negative.   Eyes: Negative.   Respiratory: Negative.   Cardiovascular: Negative.   Gastrointestinal: Negative.   Endocrine: Negative.   Genitourinary: Negative.   Musculoskeletal: Negative.   Skin: Negative.   Allergic/Immunologic: Negative.   Neurological: Negative.   Hematological: Negative.   Psychiatric/Behavioral: Negative.       Objective     BP: 136/72  Pulse: 84  Temp: 98.5 F (36.9 C)  TempSrc: Oral  SpO2: 97%  Weight: 150 lb 9.6 oz (68.3 kg)  Height: 5\' 4"  (1.626 m)      Physical Exam Vitals reviewed.  Constitutional:      General: She is not in acute distress.    Appearance: Normal appearance. She is well-developed. She is not ill-appearing or diaphoretic.  HENT:     Head: Normocephalic and atraumatic.     Right Ear: Tympanic membrane, ear canal and external ear normal. There is no impacted cerumen.      Left Ear: Tympanic membrane, ear canal and external ear normal. There is no impacted cerumen.     Nose: Nose normal.     Mouth/Throat:     Mouth: Mucous membranes are moist.     Pharynx: Oropharynx is clear. No oropharyngeal exudate.  Eyes:     General: No scleral icterus.       Right eye: No discharge.        Left eye: No discharge.     Extraocular Movements: Extraocular movements intact.  Conjunctiva/sclera: Conjunctivae normal.     Pupils: Pupils are equal, round, and reactive to light.  Neck:     Thyroid: No thyroid mass, thyromegaly or thyroid tenderness.     Vascular: No carotid bruit or JVD.     Trachea: No tracheal deviation.  Cardiovascular:     Rate and Rhythm: Normal rate and regular rhythm.     Pulses: Normal pulses.     Heart sounds: Normal heart sounds. No murmur heard.  No friction rub. No gallop.   Pulmonary:     Effort: Pulmonary effort is normal. No respiratory distress.     Breath sounds: Normal breath sounds. No wheezing or rales.  Chest:     Chest wall: No tenderness.  Abdominal:     General: Abdomen is flat. Bowel sounds are normal. There is no distension.     Palpations: Abdomen is soft. There is no mass.     Tenderness: There is no abdominal tenderness. There is no guarding or rebound.  Musculoskeletal:        General: No tenderness. Normal range of motion.     Cervical back: Normal range of motion and neck supple.     Right lower leg: No edema.     Left lower leg: No edema.  Lymphadenopathy:     Cervical: No cervical adenopathy.  Skin:    General: Skin is warm and dry.     Capillary Refill: Capillary refill takes less than 2 seconds.     Findings: No rash.  Neurological:     General: No focal deficit present.     Mental Status: She is alert and oriented to person, place, and time. Mental status is at baseline.  Psychiatric:        Mood and Affect: Mood normal.        Behavior: Behavior normal.        Thought Content: Thought content normal.         Judgment: Judgment normal.     Last depression screening scores PHQ 2/9 Scores 11/15/2019 05/22/2019 05/17/2018  PHQ - 2 Score 1 0 1  PHQ- 9 Score 2 - -   Last fall risk screening Fall Risk  05/26/2020  Falls in the past year? 0  Number falls in past yr: 0  Injury with Fall? 0  Follow up Falls prevention discussed   Last Audit-C alcohol use screening Alcohol Use Disorder Test (AUDIT) 05/26/2020  1. How often do you have a drink containing alcohol? 4  2. How many drinks containing alcohol do you have on a typical day when you are drinking? 0  3. How often do you have six or more drinks on one occasion? 0  AUDIT-C Score 4  4. How often during the last year have you found that you were not able to stop drinking once you had started? 0  5. How often during the last year have you failed to do what was normally expected from you because of drinking? 0  6. How often during the last year have you needed a first drink in the morning to get yourself going after a heavy drinking session? 0  7. How often during the last year have you had a feeling of guilt of remorse after drinking? 0  8. How often during the last year have you been unable to remember what happened the night before because you had been drinking? 0  9. Have you or someone else been injured as a result of your drinking? 0  10. Has a relative or friend or a doctor or another health worker been concerned about your drinking or suggested you cut down? 0  Alcohol Use Disorder Identification Test Final Score (AUDIT) 4  Alcohol Brief Interventions/Follow-up AUDIT Score <7 follow-up not indicated   A score of 3 or more in women, and 4 or more in men indicates increased risk for alcohol abuse, EXCEPT if all of the points are from question 1   No results found for any visits on 05/26/20.  Assessment & Plan    Routine Health Maintenance and Physical Exam  Exercise Activities and Dietary recommendations Goals    . Prevent falls      Recommend to remove any items from the home that may cause slips or trips.    . Reduce alcohol intake to 1 serving a day.        Immunization History  Administered Date(s) Administered  . H1N1 09/25/2008  . Influenza Split 07/16/2010, 07/28/2011, 08/08/2012  . Influenza, High Dose Seasonal PF 07/16/2014, 07/19/2015, 07/09/2016, 07/21/2017, 07/19/2018  . Influenza,inj,Quad PF,6+ Mos 07/12/2013  . PFIZER SARS-COV-2 Vaccination 12/08/2019, 01/01/2020  . Pneumococcal Conjugate-13 05/05/2015  . Pneumococcal Polysaccharide-23 08/27/2011  . Tdap 01/11/2006  . Zoster 08/17/2011  . Zoster Recombinat (Shingrix) 05/16/2017, 07/21/2017    Health Maintenance  Topic Date Due  . INFLUENZA VACCINE  05/18/2020  . TETANUS/TDAP  10/18/2026 (Originally 01/12/2016)  . COLONOSCOPY  03/10/2021  . MAMMOGRAM  04/04/2021  . DEXA SCAN  04/05/2021  . COVID-19 Vaccine  Completed  . Hepatitis C Screening  Completed  . PNA vac Low Risk Adult  Completed    Discussed health benefits of physical activity, and encouraged her to engage in regular exercise appropriate for her age and condition.  1. Annual physical exam Normal physical exam today. Will check labs as below and f/u pending lab results. If labs are stable and WNL she will not need to have these rechecked for one year at her next annual physical exam. She is to call the office in the meantime if she has any acute issue, questions or concerns. - CBC with Differential/Platelet - Comprehensive metabolic panel  2. Encounter for lipid screening for cardiovascular disease Stable. Continue Simvastatin 10mg . Will check labs as below and f/u pending results. - Lipid panel  3. Recurrent major depressive disorder, in full remission ([place]) Stable. Continue citalopram 20mg . Will check labs as below and f/u pending results.   No follow-ups on file.     Reynolds Bowl, PA-C, have reviewed all documentation for this visit. The documentation on  06/08/20 for the exam, diagnosis, procedures, and orders are all accurate and complete.   [place]  [facility] [phone] (phone) [phone] (fax)  [place]

## 2020-05-26 NOTE — Patient Instructions (Signed)
Health Maintenance After Age 74 After age 74, you are at a higher risk for certain long-term diseases and infections as well as injuries from falls. Falls are a major cause of broken bones and head injuries in people who are older than age 74. Getting regular preventive care can help to keep you healthy and well. Preventive care includes getting regular testing and making lifestyle changes as recommended by your health care provider. Talk with your health care provider about:  Which screenings and tests you should have. A screening is a test that checks for a disease when you have no symptoms.  A diet and exercise plan that is right for you. What should I know about screenings and tests to prevent falls? Screening and testing are the best ways to find a health problem early. Early diagnosis and treatment give you the best chance of managing medical conditions that are common after age 74. Certain conditions and lifestyle choices may make you more likely to have a fall. Your health care provider may recommend:  Regular vision checks. Poor vision and conditions such as cataracts can make you more likely to have a fall. If you wear glasses, make sure to get your prescription updated if your vision changes.  Medicine review. Work with your health care provider to regularly review all of the medicines you are taking, including over-the-counter medicines. Ask your health care provider about any side effects that may make you more likely to have a fall. Tell your health care provider if any medicines that you take make you feel dizzy or sleepy.  Osteoporosis screening. Osteoporosis is a condition that causes the bones to get weaker. This can make the bones weak and cause them to break more easily.  Blood pressure screening. Blood pressure changes and medicines to control blood pressure can make you feel dizzy.  Strength and balance checks. Your health care provider may recommend certain tests to check your  strength and balance while standing, walking, or changing positions.  Foot health exam. Foot pain and numbness, as well as not wearing proper footwear, can make you more likely to have a fall.  Depression screening. You may be more likely to have a fall if you have a fear of falling, feel emotionally low, or feel unable to do activities that you used to do.  Alcohol use screening. Using too much alcohol can affect your balance and may make you more likely to have a fall. What actions can I take to lower my risk of falls? General instructions  Talk with your health care provider about your risks for falling. Tell your health care provider if: ? You fall. Be sure to tell your health care provider about all falls, even ones that seem minor. ? You feel dizzy, sleepy, or off-balance.  Take over-the-counter and prescription medicines only as told by your health care provider. These include any supplements.  Eat a healthy diet and maintain a healthy weight. A healthy diet includes low-fat dairy products, low-fat (lean) meats, and fiber from whole grains, beans, and lots of fruits and vegetables. Home safety  Remove any tripping hazards, such as rugs, cords, and clutter.  Install safety equipment such as grab bars in bathrooms and safety rails on stairs.  Keep rooms and walkways well-lit. Activity   Follow a regular exercise program to stay fit. This will help you maintain your balance. Ask your health care provider what types of exercise are appropriate for you.  If you need a cane or   walker, use it as recommended by your health care provider.  Wear supportive shoes that have nonskid soles. Lifestyle  Do not drink alcohol if your health care provider tells you not to drink.  If you drink alcohol, limit how much you have: ? 0-1 drink a day for women. ? 0-2 drinks a day for men.  Be aware of how much alcohol is in your drink. In the [place] one drink equals one typical bottle of beer (12  oz), one-half glass of wine (5 oz), or one shot of hard liquor (1 oz).  Do not use any products that contain nicotine or tobacco, such as cigarettes and e-cigarettes. If you need help quitting, ask your health care provider. Summary  Having a healthy lifestyle and getting preventive care can help to protect your health and wellness after age 74.  Screening and testing are the best way to find a health problem early and help you avoid having a fall. Early diagnosis and treatment give you the best chance for managing medical conditions that are more common for people who are older than age 74.  Falls are a major cause of broken bones and head injuries in people who are older than age 74. Take precautions to prevent a fall at home.  Work with your health care provider to learn what changes you can make to improve your health and wellness and to prevent falls. This information is not intended to replace advice given to you by your health care provider. Make sure you discuss any questions you have with your health care provider. Document Revised: 01/25/2019 Document Reviewed: 08/17/2017 Elsevier Patient Education  2020 Elsevier Inc.  

## 2020-05-27 ENCOUNTER — Telehealth: Payer: Self-pay

## 2020-05-27 DIAGNOSIS — E7800 Pure hypercholesterolemia, unspecified: Secondary | ICD-10-CM

## 2020-05-27 LAB — COMPREHENSIVE METABOLIC PANEL
ALT: 19 IU/L (ref 0–32)
AST: 23 IU/L (ref 0–40)
Albumin/Globulin Ratio: 1.5 (ref 1.2–2.2)
Albumin: 4.3 g/dL (ref 3.7–4.7)
Alkaline Phosphatase: 77 IU/L (ref 48–121)
BUN/Creatinine Ratio: 19 (ref 12–28)
BUN: 15 mg/dL (ref 8–27)
Bilirubin Total: 0.4 mg/dL (ref 0.0–1.2)
CO2: 23 mmol/L (ref 20–29)
Calcium: 9.4 mg/dL (ref 8.7–10.3)
Chloride: 104 mmol/L (ref 96–106)
Creatinine, Ser: 0.8 mg/dL (ref 0.57–1.00)
GFR calc Af Amer: 84 mL/min/{1.73_m2} (ref >59)
GFR calc non Af Amer: 73 mL/min/{1.73_m2} (ref >59)
Globulin, Total: 2.9 g/dL (ref 1.5–4.5)
Glucose: 113 mg/dL — ABNORMAL HIGH (ref 65–99)
Potassium: 4.1 mmol/L (ref 3.5–5.2)
Sodium: 141 mmol/L (ref 134–144)
Total Protein: 7.2 g/dL (ref 6.0–8.5)

## 2020-05-27 LAB — CBC WITH DIFFERENTIAL/PLATELET
Basophils Absolute: 0.1 10*3/uL (ref 0.0–0.2)
Basos: 1 %
EOS (ABSOLUTE): 0.2 10*3/uL (ref 0.0–0.4)
Eos: 2 %
Hematocrit: 41.3 % (ref 34.0–46.6)
Hemoglobin: 13.3 g/dL (ref 11.1–15.9)
Immature Grans (Abs): 0 10*3/uL (ref 0.0–0.1)
Immature Granulocytes: 0 %
Lymphocytes Absolute: 1.7 10*3/uL (ref 0.7–3.1)
Lymphs: 27 %
MCH: 33.2 pg — ABNORMAL HIGH (ref 26.6–33.0)
MCHC: 32.2 g/dL (ref 31.5–35.7)
MCV: 103 fL — ABNORMAL HIGH (ref 79–97)
Monocytes Absolute: 0.5 10*3/uL (ref 0.1–0.9)
Monocytes: 8 %
Neutrophils Absolute: 3.9 10*3/uL (ref 1.4–7.0)
Neutrophils: 62 %
Platelets: 244 10*3/uL (ref 150–450)
RBC: 4.01 x10E6/uL (ref 3.77–5.28)
RDW: 11.8 % (ref 11.7–15.4)
WBC: 6.3 10*3/uL (ref 3.4–10.8)

## 2020-05-27 LAB — LIPID PANEL
Chol/HDL Ratio: 2.9 ratio (ref 0.0–4.4)
Cholesterol, Total: 236 mg/dL — ABNORMAL HIGH (ref 100–199)
HDL: 82 mg/dL (ref >39)
LDL Chol Calc (NIH): 141 mg/dL — ABNORMAL HIGH (ref 0–99)
Triglycerides: 76 mg/dL (ref 0–149)
VLDL Cholesterol Cal: 13 mg/dL (ref 5–40)

## 2020-05-27 NOTE — Telephone Encounter (Signed)
Patient advised as directed below. She agreed on starting a cholesterol medication and asked if it can be sen to her mail order pharmacy.

## 2020-05-28 MED ORDER — SIMVASTATIN 10 MG PO TABS: 10.0000 mg | ORAL_TABLET | Freq: Every day | ORAL | 3 refills | Status: DC

## 2020-05-28 NOTE — Telephone Encounter (Signed)
Simvastatin 10mg  sent to [facility]

## 2020-05-29 ENCOUNTER — Other Ambulatory Visit: Payer: Self-pay | Admitting: Physician Assistant

## 2020-05-29 DIAGNOSIS — F4322 Adjustment disorder with anxiety: Secondary | ICD-10-CM

## 2020-07-08 ENCOUNTER — Telehealth: Payer: Self-pay

## 2020-07-08 DIAGNOSIS — E7800 Pure hypercholesterolemia, unspecified: Secondary | ICD-10-CM

## 2020-07-08 MED ORDER — SIMVASTATIN 10 MG PO TABS: 10.0000 mg | ORAL_TABLET | Freq: Every day | ORAL | 3 refills | Status: DC

## 2020-07-08 NOTE — Telephone Encounter (Signed)
Copied from [place] [id number]. Topic: General - Inquiry >> Jul 08, 2020 10:55 AM Greggory Keen D wrote: Reason for CRM: Pt called saying she still has not got the Simvastatin.  It was sem to the wrong pharmacy.  She does not use [facility] [employer] [facility].  She uses [facility] order.  She would like a cll back making sure that the rx was sent to the correct pharmacy.  CB#  [phone]

## 2020-07-08 NOTE — Telephone Encounter (Signed)
Medication sent to the incorrect pharmacy, corrected this for patient.

## 2020-11-15 ENCOUNTER — Other Ambulatory Visit: Payer: Self-pay | Admitting: Physician Assistant

## 2020-11-15 DIAGNOSIS — F4322 Adjustment disorder with anxiety: Secondary | ICD-10-CM

## 2020-11-15 DIAGNOSIS — K219 Gastro-esophageal reflux disease without esophagitis: Secondary | ICD-10-CM

## 2020-11-16 NOTE — Telephone Encounter (Signed)
Requested Prescriptions  Pending Prescriptions Disp Refills  . omeprazole (PRILOSEC) 20 MG capsule [Pharmacy Med Name: Omeprazole 20 MG Oral Capsule Delayed Release] 180 capsule 2    Sig: TAKE 1 CAPSULE BY MOUTH  TWICE DAILY BEFORE MEALS     Gastroenterology: Proton Pump Inhibitors Passed - 11/15/2020 10:28 PM      Passed - Valid encounter within last 12 months    Recent Outpatient Visits          5 months ago Annual physical exam   [facility] [place], Clearnce Sorrel, PA-C   1 year ago Acute pain of right knee   [facility] [place], Clearnce Sorrel, [place]   1 year ago Annual physical exam   [facility] [place], Clearnce Sorrel, [place]   2 years ago Annual physical exam   [facility] [place], Clearnce Sorrel, [place]   2 years ago [place], Clearnce Sorrel, [place]      Future Appointments            In 6 months [place], Clearnce Sorrel, PA-C [employer], [place]           . citalopram ([place]) 20 MG tablet [employer] Med Name: Citalopram Hydrobromide 20 MG Oral Tablet] 90 tablet 2    Sig: TAKE 1 TABLET BY MOUTH  DAILY     Psychiatry:  Antidepressants - SSRI Failed - 11/15/2020 10:28 PM      Failed - Completed PHQ-2 or PHQ-9 in the last 360 days      Passed - Valid encounter within last 6 months    Recent Outpatient Visits          5 months ago Annual physical exam   [place], [place]   1 year ago Acute pain of right knee   [place], Clearnce Sorrel, [place]   1 year ago Annual physical exam   [facility] [place], Clearnce Sorrel, [place]   2 years ago Annual physical exam   [facility] [place], Clearnce Sorrel, [place]   2 years ago [place], Clearnce Sorrel, [place]      Future Appointments            In 6 months [place], Clearnce Sorrel, PA-C [employer], [place]

## 2020-11-17 ENCOUNTER — Telehealth: Payer: Self-pay

## 2020-11-17 DIAGNOSIS — Z1211 Encounter for screening for malignant neoplasm of colon: Secondary | ICD-10-CM

## 2020-11-17 NOTE — Telephone Encounter (Signed)
Referral placed.

## 2020-11-17 NOTE — Telephone Encounter (Signed)
Copied from [place] [phone]. Topic: Referral - Request for Referral >> Nov 17, 2020  3:01 PM Lennox Solders wrote: Has patient seen PCP for this complaint? No  *Referral for which specialty: GI Preferred provider/office:[place] gi dept phone number [phone] and fax number [phone] Reason for referral: Pt is due for routine colonoscopy in April 2022. Pt has [employer]

## 2020-11-17 NOTE — Addendum Note (Signed)
Addended by: Mar Daring on: 11/17/2020 04:18 PM   Modules accepted: Orders

## 2020-11-17 NOTE — Addendum Note (Signed)
Addended by: Doristine Devoid on: 11/17/2020 03:19 PM   Modules accepted: Orders

## 2020-11-20 ENCOUNTER — Telehealth (INDEPENDENT_AMBULATORY_CARE_PROVIDER_SITE_OTHER): Payer: Self-pay | Admitting: Gastroenterology

## 2020-11-20 ENCOUNTER — Other Ambulatory Visit: Payer: Self-pay

## 2020-11-20 DIAGNOSIS — Z1211 Encounter for screening for malignant neoplasm of colon: Secondary | ICD-10-CM

## 2020-11-20 DIAGNOSIS — Z85038 Personal history of other malignant neoplasm of large intestine: Secondary | ICD-10-CM

## 2020-11-20 MED ORDER — NA SULFATE-K SULFATE-MG SULF 17.5-3.13-1.6 GM/177ML PO SOLN: 1.0000 | Freq: Once | ORAL | 0 refills | Status: AC

## 2020-11-20 NOTE — Progress Notes (Signed)
Gastroenterology Pre-Procedure Review  Request Date: 03/09/21 Requesting Physician: Dr. Vicente Males  PATIENT REVIEW QUESTIONS: The patient responded to the following health history questions as indicated:    1. Are you having any GI issues? no 2. Do you have a personal history of Polyps? yes (patient has personal history of colon cancer.  Pt states Dr. Jamal Collin removed 10' inches of colon.) 3. Do you have a family history of Colon Cancer or Polyps? no 4. Diabetes Mellitus? no 5. Joint replacements in the past 12 months?no 6. Major health problems in the past 3 months?no 7. Any artificial heart valves, MVP, or defibrillator?no    MEDICATIONS & ALLERGIES:    Patient reports the following regarding taking any anticoagulation/antiplatelet therapy:   Plavix, Coumadin, Eliquis, Xarelto, Lovenox, Pradaxa, Brilinta, or Effient? no Aspirin? Yes 81 mg  Patient confirms/reports the following medications:  Current Outpatient Medications  Medication Sig Dispense Refill  . aspirin 81 MG tablet Take 81 mg by mouth daily.    . Cholecalciferol (VITAMIN D3 PO) Take by mouth daily.     . citalopram (CELEXA) 20 MG tablet TAKE 1 TABLET BY MOUTH  DAILY 90 tablet 2  . fluticasone (FLONASE) 50 MCG/ACT nasal spray Two sprays each nostril daily. 48 g 3  . Multiple Vitamin (MULTIVITAMIN) tablet Take 1 tablet by mouth daily.    Marland Kitchen omeprazole (PRILOSEC) 20 MG capsule TAKE 1 CAPSULE BY MOUTH  TWICE DAILY BEFORE MEALS 180 capsule 2  . simvastatin (ZOCOR) 10 MG tablet Take 1 tablet (10 mg total) by mouth at bedtime. 90 tablet 3  . vitamin B-12 (CYANOCOBALAMIN) 1000 MCG tablet Take 1,000 mcg by mouth daily.    Marland Kitchen albuterol (VENTOLIN HFA) 108 (90 Base) MCG/ACT inhaler Inhale 2 puffs into the lungs every 6 (six) hours as needed for wheezing or shortness of breath. (Patient not taking: Reported on 11/20/2020) 18 g 0  . diphenhydrAMINE (BENADRYL) 25 MG tablet Take 25 mg by mouth daily. (Patient not taking: Reported on 11/20/2020)    .  meloxicam (MOBIC) 7.5 MG tablet TAKE 1 TABLET BY MOUTH EVERY DAY (Patient not taking: Reported on 11/20/2020) 90 tablet 0   No current facility-administered medications for this visit.    Patient confirms/reports the following allergies:  Allergies  Allergen Reactions  . Codeine Nausea And Vomiting  . Peanut-Containing Drug Products Diarrhea and Nausea And Vomiting    Pine nut  . Phenergan [Promethazine Hcl] Other (See Comments)    tremors    No orders of the defined types were placed in this encounter.   AUTHORIZATION INFORMATION Primary Insurance: 1D#: Group #:  Secondary Insurance: 1D#: Group #:  SCHEDULE INFORMATION: Date: 03/09/21 Time: Location:[facility]

## 2020-11-20 NOTE — Progress Notes (Signed)
colon

## 2020-12-15 ENCOUNTER — Other Ambulatory Visit: Payer: Self-pay | Admitting: Physician Assistant

## 2020-12-15 DIAGNOSIS — J309 Allergic rhinitis, unspecified: Secondary | ICD-10-CM

## 2021-02-02 ENCOUNTER — Telehealth: Payer: Self-pay | Admitting: Physician Assistant

## 2021-02-02 DIAGNOSIS — Z1231 Encounter for screening mammogram for malignant neoplasm of breast: Secondary | ICD-10-CM

## 2021-02-02 NOTE — Telephone Encounter (Signed)
Patient advised as below.  

## 2021-02-02 NOTE — Telephone Encounter (Signed)
Patient called to request a referral for a mammogram at the [facility].  She is a former patient of Dr. Tollie Pizza and since she has left patient would like to know if she can still get the referral.  Please advise and all patient to confirm referral at [phone]

## 2021-03-09 ENCOUNTER — Encounter: Payer: Self-pay | Admitting: Gastroenterology

## 2021-03-09 ENCOUNTER — Ambulatory Visit: Payer: Medicare Other | Admitting: Registered Nurse

## 2021-03-09 ENCOUNTER — Other Ambulatory Visit: Payer: Self-pay

## 2021-03-09 ENCOUNTER — Ambulatory Visit
Admission: RE | Admit: 2021-03-09 | Discharge: 2021-03-09 | Disposition: A | Discharge status: Home / Self Care | Payer: Medicare Other | Source: Ambulatory Visit | Attending: Gastroenterology | Admitting: Gastroenterology

## 2021-03-09 ENCOUNTER — Encounter
Admission: RE | Disposition: A | Discharge status: Home / Self Care | Payer: Self-pay | Source: Ambulatory Visit | Attending: Gastroenterology

## 2021-03-09 DIAGNOSIS — Z7982 Long term (current) use of aspirin: Secondary | ICD-10-CM | POA: Diagnosis not present

## 2021-03-09 DIAGNOSIS — Z9049 Acquired absence of other specified parts of digestive tract: Secondary | ICD-10-CM | POA: Diagnosis not present

## 2021-03-09 DIAGNOSIS — Z885 Allergy status to narcotic agent status: Secondary | ICD-10-CM | POA: Diagnosis not present

## 2021-03-09 DIAGNOSIS — Z85038 Personal history of other malignant neoplasm of large intestine: Secondary | ICD-10-CM | POA: Insufficient documentation

## 2021-03-09 DIAGNOSIS — K5730 Diverticulosis of large intestine without perforation or abscess without bleeding: Secondary | ICD-10-CM | POA: Insufficient documentation

## 2021-03-09 DIAGNOSIS — Z1211 Encounter for screening for malignant neoplasm of colon: Secondary | ICD-10-CM | POA: Insufficient documentation

## 2021-03-09 DIAGNOSIS — Z888 Allergy status to other drugs, medicaments and biological substances status: Secondary | ICD-10-CM | POA: Diagnosis not present

## 2021-03-09 DIAGNOSIS — Z980 Intestinal bypass and anastomosis status: Secondary | ICD-10-CM | POA: Insufficient documentation

## 2021-03-09 DIAGNOSIS — K635 Polyp of colon: Secondary | ICD-10-CM | POA: Diagnosis not present

## 2021-03-09 DIAGNOSIS — D124 Benign neoplasm of descending colon: Secondary | ICD-10-CM | POA: Insufficient documentation

## 2021-03-09 DIAGNOSIS — Z79899 Other long term (current) drug therapy: Secondary | ICD-10-CM | POA: Diagnosis not present

## 2021-03-09 HISTORY — PX: COLONOSCOPY WITH PROPOFOL: SHX5780

## 2021-03-09 SURGERY — COLONOSCOPY WITH PROPOFOL
Anesthesia: General

## 2021-03-09 MED ORDER — PROPOFOL 500 MG/50ML IV EMUL
INTRAVENOUS | Status: DC | PRN
  Administered 2021-03-09: 150 ug/kg/min via INTRAVENOUS

## 2021-03-09 MED ORDER — PROPOFOL 500 MG/50ML IV EMUL
INTRAVENOUS | Status: AC
  Filled 2021-03-09: qty 200

## 2021-03-09 MED ORDER — PROPOFOL 10 MG/ML IV BOLUS
INTRAVENOUS | Status: DC | PRN
  Administered 2021-03-09: 10 mg via INTRAVENOUS
  Administered 2021-03-09: 70 mg via INTRAVENOUS

## 2021-03-09 MED ORDER — PHENYLEPHRINE HCL (PRESSORS) 10 MG/ML IV SOLN
INTRAVENOUS | Status: AC
  Filled 2021-03-09: qty 1

## 2021-03-09 MED ORDER — SODIUM CHLORIDE 0.9 % IV SOLN
INTRAVENOUS | Status: DC
  Administered 2021-03-09: 20 mL/h via INTRAVENOUS

## 2021-03-09 MED ORDER — LIDOCAINE HCL (PF) 2 % IJ SOLN
INTRAMUSCULAR | Status: AC
  Filled 2021-03-09: qty 30

## 2021-03-09 MED ORDER — LIDOCAINE HCL (CARDIAC) PF 100 MG/5ML IV SOSY
PREFILLED_SYRINGE | INTRAVENOUS | Status: DC | PRN
  Administered 2021-03-09: 100 mg via INTRAVENOUS

## 2021-03-09 NOTE — Op Note (Signed)
[facility] Gastroenterology Patient Name: Lisa Caldwell Procedure Date: 03/09/2021 7:30 AM MRN: [id number] Account #: [id number] Date of Birth: 02/10/1946 Admit Type: Outpatient Age: 75 Room: [facility] ENDO ROOM 4 Gender: Female Note Status: Finalized Procedure:             Colonoscopy Indications:           Surveillance: Personal history of colonic polyps                         (unknown histology) on last colonoscopy 5 years ago,                         Last colonoscopy: May 2017 Providers:             Jonathon Bellows MD, MD Referring MD:          Mar Daring (Referring MD) Medicines:             Monitored Anesthesia Care Complications:         No immediate complications. Procedure:             Pre-Anesthesia Assessment:                        - Prior to the procedure, a History and Physical was                         performed, and patient medications, allergies and                         sensitivities were reviewed. The patient's tolerance                         of previous anesthesia was reviewed.                        - The risks and benefits of the procedure and the                         sedation options and risks were discussed with the                         patient. All questions were answered and informed                         consent was obtained.                        - ASA Grade Assessment: II - A patient with mild                         systemic disease.                        After obtaining informed consent, the colonoscope was                         passed under direct vision. Throughout the procedure,                         the patient's blood pressure, pulse, and  oxygen                         saturations were monitored continuously. The                         Colonoscope was introduced through the anus and                         advanced to the the ileocolonic anastomosis. The                         colonoscopy was performed  with ease. The patient                         tolerated the procedure well. The quality of the bowel                         preparation was excellent. Findings:      The perianal and digital rectal examinations were normal.      Multiple small and large-mouthed diverticula were found in the left       colon.      A 10 mm polyp was found in the transverse colon. The polyp was sessile.       The polyp was removed with a cold snare. Resection and retrieval were       complete.      A 5 mm polyp was found in the descending colon. The polyp was sessile.       The polyp was removed with a cold snare. Resection and retrieval were       complete.      There was evidence of a prior end-to-end colo-colonic anastomosis in the       ascending colon. This was patent and was characterized by healthy       appearing mucosa. The anastomosis was traversed.      The exam was otherwise without abnormality on direct and retroflexion       views. Impression:            - Diverticulosis in the left colon.                        - One 10 mm polyp in the transverse colon, removed                         with a cold snare. Resected and retrieved.                        - One 5 mm polyp in the descending colon, removed with                         a cold snare. Resected and retrieved.                        - Patent end-to-end colo-colonic anastomosis,                         characterized by healthy appearing mucosa.                        -  The examination was otherwise normal on direct and                         retroflexion views. Recommendation:        - Discharge patient to home (with escort).                        - Resume previous diet.                        - Continue present medications.                        - Await pathology results.                        - Repeat colonoscopy for surveillance based on                         pathology results. Procedure Code(s):     --- Professional ---                         [phone], Colonoscopy, flexible; with removal of                         tumor(s), polyp(s), or other lesion(s) by snare                         technique Diagnosis Code(s):     --- Professional ---                        Z86.010, Personal history of colonic polyps                        K63.5, Polyp of colon                        Z98.0, Intestinal bypass and anastomosis status                        K57.30, Diverticulosis of large intestine without                         perforation or abscess without bleeding CPT copyright 2019 American Medical Association. All rights reserved. The codes documented in this report are preliminary and upon coder review may  be revised to meet current compliance requirements. Jonathon Bellows, MD Jonathon Bellows MD, MD 03/09/2021 8:02:27 AM This report has been signed electronically. Number of Addenda: 0 Note Initiated On: 03/09/2021 7:30 AM Scope Withdrawal Time: 0 hours 11 minutes 53 seconds  Total Procedure Duration: 0 hours 18 minutes 11 seconds  Estimated Blood Loss:  Estimated blood loss: none.      [facility]

## 2021-03-09 NOTE — Anesthesia Postprocedure Evaluation (Signed)
Anesthesia Post Note  Patient: Lisa Caldwell  Procedure(s) Performed: COLONOSCOPY WITH PROPOFOL (N/A )  Patient location during evaluation: Endoscopy Anesthesia Type: General Level of consciousness: awake and alert Pain management: pain level controlled Vital Signs Assessment: post-procedure vital signs reviewed and stable Respiratory status: spontaneous breathing, nonlabored ventilation, respiratory function stable and patient connected to nasal cannula oxygen Cardiovascular status: blood pressure returned to baseline and stable Postop Assessment: no apparent nausea or vomiting Anesthetic complications: no   No complications documented.   Last Vitals:  Vitals:   03/09/21 0820 03/09/21 0830  BP: (!) 145/76 (!) 168/77  Pulse: 61 60  Resp: (!) 21 18  Temp:    SpO2: 99% 99%    Last Pain:  Vitals:   03/09/21 0806  TempSrc: Temporal  PainSc:                  Precious Haws Katilynn Sinkler

## 2021-03-09 NOTE — Transfer of Care (Signed)
Immediate Anesthesia Transfer of Care Note  Patient: Lisa Caldwell  Procedure(s) Performed: COLONOSCOPY WITH PROPOFOL (N/A )  Patient Location: Endoscopy Unit  Anesthesia Type:General  Level of Consciousness: drowsy  Airway & Oxygen Therapy: Patient Spontanous Breathing  Post-op Assessment: Report given to RN and Post -op Vital signs reviewed and stable  Post vital signs: Reviewed and stable  Last Vitals:  Vitals Value Taken Time  BP 96/41 03/09/21 0806  Temp 35.7 C 03/09/21 0806  Pulse 57 03/09/21 0811  Resp 24 03/09/21 0811  SpO2 98 % 03/09/21 0811  Vitals shown include unvalidated device data.  Last Pain:  Vitals:   03/09/21 0806  TempSrc: Temporal  PainSc:          Complications: No complications documented.

## 2021-03-09 NOTE — H&P (Signed)
Jonathon Bellows, MD [address], [place], [place], [place], [address] [address], [place], [place], [place], [address] Phone: [phone]  Fax: [phone]  Primary Care Physician:  Mar Daring, PA-C   Pre-Procedure History & Physical: HPI:  Lisa Caldwell is a 75 y.o. female is here for an colonoscopy.   Past Medical History:  Diagnosis Date  . Allergy   . Anxiety   . Cancer [facility])    colon cancer  . Depression   . GERD (gastroesophageal reflux disease)   . Headache(784.0)   . Inflammatory polyps of colon with rectal bleeding ([place]) 2008    rt colectomy for a polyp with carcinoma in situ   . Personal history of malignant neoplasm of large intestine     Past Surgical History:  Procedure Laterality Date  . ABDOMINAL HYSTERECTOMY  1996  . APPENDECTOMY    . COLECTOMY Right 2008  . COLONOSCOPY  2012   Dr. Jamal Collin  . COLONOSCOPY WITH PROPOFOL N/A 03/10/2016   Procedure: COLONOSCOPY WITH PROPOFOL;  Surgeon: [name], MD;  Location: [facility] ENDOSCOPY;  Service: Endoscopy;  Laterality: N/A;  . FOOT SURGERY Left 2012  . FOOT SURGERY Left 2013  . THYROGLOSSAL DUCT CYST N/A 09/04/2015   Procedure: THYROGLOSSAL DUCT CYST;  Surgeon: Carloyn Manner, MD;  Location: [facility] ORS;  Service: ENT;  Laterality: N/A;    Prior to Admission medications   Medication Sig Start Date End Date Taking? Authorizing Provider  aspirin 81 MG tablet Take 81 mg by mouth daily.   Yes [provider]  Cholecalciferol (VITAMIN D3 PO) Take by mouth daily.    Yes [provider]  citalopram (CELEXA) 20 MG tablet TAKE 1 TABLET BY MOUTH  DAILY 11/16/20  Yes Fenton Malling M, PA-C  fluticasone [facility]) 50 MCG/ACT nasal spray USE 2 SPRAYS IN BOTH  NOSTRILS DAILY 12/15/20  Yes Mar Daring, PA-C  Multiple Vitamin (MULTIVITAMIN) tablet Take 1 tablet by mouth daily.   Yes [provider]  omeprazole (PRILOSEC) 20 MG capsule TAKE 1 CAPSULE BY MOUTH  TWICE  DAILY BEFORE MEALS 11/16/20  Yes Fenton Malling M, PA-C  simvastatin (ZOCOR) 10 MG tablet Take 1 tablet (10 mg total) by mouth at bedtime. 07/08/20  Yes Mar Daring, PA-C  vitamin B-12 (CYANOCOBALAMIN) 1000 MCG tablet Take 1,000 mcg by mouth daily.   Yes [provider]  albuterol (VENTOLIN HFA) 108 (90 Base) MCG/ACT inhaler Inhale 2 puffs into the lungs every 6 (six) hours as needed for wheezing or shortness of breath. Patient not taking: Reported on 11/20/2020 05/22/19   Mar Daring, PA-C  diphenhydrAMINE (BENADRYL) 25 MG tablet Take 25 mg by mouth daily. Patient not taking: Reported on 11/20/2020    [provider]  meloxicam (MOBIC) 7.5 MG tablet TAKE 1 TABLET BY MOUTH EVERY DAY Patient not taking: Reported on 11/20/2020 03/23/20   Mar Daring, PA-C    Allergies as of 11/20/2020 - Review Complete 11/20/2020  Allergen Reaction Noted  . Codeine Nausea And Vomiting 12/08/2012  . Peanut-containing drug products Diarrhea and Nausea And Vomiting 02/07/2014  . Phenergan [promethazine hcl] Other (See Comments) 01/31/2013    Family History  Problem Relation Age of Onset  . Heart disease Mother   . Heart disease Father   . Drug abuse Brother   . Breast cancer Neg Hx     Social History   Socioeconomic History  . Marital status: Married    Spouse name: Gershon Mussel  .  Number of children: 2  . Years of education: Not on file  . Highest education level: 12th grade  Occupational History  . Occupation: Retired  Tobacco Use  . Smoking status: Never Smoker  . Smokeless tobacco: Never Used  Substance and Sexual Activity  . Alcohol use: Yes    Alcohol/week: 14.0 standard drinks    Types: 14 Cans of beer per week    Comment: 2 a day  . Drug use: No  . Sexual activity: Never    Birth control/protection: Other-see comments    Comment: hysterectomy  Other Topics Concern  . Not on file  Social History Narrative  . Not on file   Social Determinants of Health    Financial Resource Strain: Low Risk   . Difficulty of Paying Living Expenses: Not hard at all  Food Insecurity: No Food Insecurity  . Worried About Charity fundraiser in the Last Year: Never true  . Ran Out of Food in the Last Year: Never true  Transportation Needs: No Transportation Needs  . Lack of Transportation (Medical): No  . Lack of Transportation (Non-Medical): No  Physical Activity: Sufficiently Active  . Days of Exercise per Week: 5 days  . Minutes of Exercise per Session: 30 min  Stress: No Stress Concern Present  . Feeling of Stress : Not at all  Social Connections: Moderately Isolated  . Frequency of Communication with Friends and Family: Three times a week  . Frequency of Social Gatherings with Friends and Family: More than three times a week  . Attends Religious Services: Never  . Active Member of Clubs or Organizations: No  . Attends Archivist Meetings: Never  . Marital Status: Married  Human resources officer Violence: Not At Risk  . Fear of Current or Ex-Partner: No  . Emotionally Abused: No  . Physically Abused: No  . Sexually Abused: No    Review of Systems: See HPI, otherwise negative ROS  Physical Exam: BP (!) 178/88   Pulse 83   Temp (!) 96.3 F (35.7 C) (Temporal)   Resp 20   Ht 5' 4.5" (1.638 m)   Wt 68.9 kg   SpO2 97%   BMI 25.69 kg/m  General:   Alert,  pleasant and cooperative in NAD Head:  Normocephalic and atraumatic. Neck:  Supple; no masses or thyromegaly. Lungs:  Clear throughout to auscultation, normal respiratory effort.    Heart:  +S1, +S2, Regular rate and rhythm, No edema. Abdomen:  Soft, nontender and nondistended. Normal bowel sounds, without guarding, and without rebound.   Neurologic:  Alert and  oriented x4;  grossly normal neurologically.  Impression/Plan: Lisa Caldwell is here for an colonoscopy to be performed for surveillance due to prior history of colon polyps   Risks, benefits, limitations, and  alternatives regarding  colonoscopy have been reviewed with the patient.  Questions have been answered.  All parties agreeable.   Jonathon Bellows, MD  03/09/2021, 7:34 AM

## 2021-03-09 NOTE — Anesthesia Preprocedure Evaluation (Signed)
Anesthesia Evaluation  Patient identified by MRN, date of birth, ID band Patient awake    Reviewed: Allergy & Precautions, H&P , NPO status , Patient's Chart, lab work & pertinent test results  History of Anesthesia Complications Negative for: history of anesthetic complications  Airway Mallampati: III  TM Distance: <3 FB Neck ROM: limited    Dental  (+) Chipped   Pulmonary neg pulmonary ROS, neg shortness of breath,    Pulmonary exam normal        Cardiovascular Exercise Tolerance: Good (-) angina(-) Past MI and (-) DOE negative cardio ROS Normal cardiovascular exam     Neuro/Psych  Headaches, PSYCHIATRIC DISORDERS    GI/Hepatic Neg liver ROS, GERD  Medicated and Controlled,  Endo/Other  negative endocrine ROS  Renal/GU negative Renal ROS  negative genitourinary   Musculoskeletal   Abdominal   Peds  Hematology negative hematology ROS (+)   Anesthesia Other Findings Past Medical History: No date: Allergy No date: Anxiety No date: Cancer [facility])     Comment:  colon cancer No date: Depression No date: GERD (gastroesophageal reflux disease) No date: Headache(784.0) 2008: Inflammatory polyps of colon with rectal bleeding ([facility])     Comment:   rt colectomy for a polyp with carcinoma in situ  No date: Personal history of malignant neoplasm of large intestine  Past Surgical History: 1996: ABDOMINAL HYSTERECTOMY No date: APPENDECTOMY 2008: COLECTOMY; Right 2012: COLONOSCOPY     Comment:  Dr. Jamal Collin 03/10/2016: COLONOSCOPY WITH PROPOFOL; N/A     Comment:  Procedure: COLONOSCOPY WITH PROPOFOL;  Surgeon:               [name], MD;  Location: [facility] ENDOSCOPY;                Service: Endoscopy;  Laterality: N/A; 2012: FOOT SURGERY; Left 2013: FOOT SURGERY; Left 09/04/2015: THYROGLOSSAL DUCT CYST; N/A     Comment:  Procedure: THYROGLOSSAL DUCT CYST;  Surgeon: Carloyn Manner, MD;   Location: [facility] ORS;  Service: ENT;                Laterality: N/A;  BMI    Body Mass Index: 25.69 kg/m      Reproductive/Obstetrics negative OB ROS                             Anesthesia Physical Anesthesia Plan  ASA: II  Anesthesia Plan: General   Post-op Pain Management:    Induction: Intravenous  PONV Risk Score and Plan: Propofol infusion and TIVA  Airway Management Planned: Natural Airway and Nasal Cannula  Additional Equipment:   Intra-op Plan:   Post-operative Plan:   Informed Consent: I have reviewed the patients History and Physical, chart, labs and discussed the procedure including the risks, benefits and alternatives for the proposed anesthesia with the patient or authorized representative who has indicated his/her understanding and acceptance.     Dental Advisory Given  Plan Discussed with: Anesthesiologist, CRNA and Surgeon  Anesthesia Plan Comments: (Patient consented for risks of anesthesia including but not limited to:  - adverse reactions to medications - risk of airway placement if required - damage to eyes, teeth, lips or other oral mucosa - nerve damage due to positioning  - sore throat or hoarseness - Damage to heart, brain, nerves, lungs, other parts of body or loss of life  Patient voiced understanding.)  Anesthesia Quick Evaluation  

## 2021-03-10 ENCOUNTER — Encounter: Payer: Self-pay | Admitting: Gastroenterology

## 2021-03-11 LAB — SURGICAL PATHOLOGY

## 2021-03-12 ENCOUNTER — Encounter: Payer: Self-pay | Admitting: Gastroenterology

## 2021-04-06 ENCOUNTER — Ambulatory Visit
Admission: RE | Admit: 2021-04-06 | Discharge: 2021-04-06 | Disposition: A | Discharge status: Home / Self Care | Payer: Medicare Other | Source: Ambulatory Visit | Attending: Family Medicine | Admitting: Family Medicine

## 2021-04-06 ENCOUNTER — Other Ambulatory Visit: Payer: Self-pay

## 2021-04-06 DIAGNOSIS — Z1231 Encounter for screening mammogram for malignant neoplasm of breast: Secondary | ICD-10-CM | POA: Diagnosis present

## 2021-04-20 ENCOUNTER — Other Ambulatory Visit: Payer: Self-pay | Admitting: Physician Assistant

## 2021-04-20 DIAGNOSIS — E7800 Pure hypercholesterolemia, unspecified: Secondary | ICD-10-CM

## 2021-04-22 ENCOUNTER — Other Ambulatory Visit: Payer: Self-pay | Admitting: Physician Assistant

## 2021-04-22 DIAGNOSIS — E7800 Pure hypercholesterolemia, unspecified: Secondary | ICD-10-CM

## 2021-04-22 MED ORDER — SIMVASTATIN 10 MG PO TABS: 10.0000 mg | ORAL_TABLET | Freq: Every day | ORAL | 0 refills | Status: DC

## 2021-04-22 NOTE — Telephone Encounter (Signed)
Patient called and was going to advise she will need to establish with a new provider, she has an appointment scheduled already. I advised I will send her Rx to the office for refill by another provider. Patient verbalized understanding.

## 2021-04-22 NOTE — Telephone Encounter (Signed)
Medication: simvastatin (ZOCOR) 10 MG tablet [[id number]] ,   Has the patient contacted their pharmacy? YES  (Agent: If no, request that the patient contact the pharmacy for the refill.) (Agent: If yes, when and what did the pharmacy advise?)  Preferred Pharmacy (with phone number or street name): [facility]  ([place], [address] [place] [address] Phone: [phone] Fax: [phone] Hours: Not open 24 hours    Agent: Please be advised that RX refills may take up to 3 business days. We ask that you follow-up with your pharmacy.

## 2021-04-22 NOTE — Telephone Encounter (Signed)
Requested medication (s) are due for refill today: Yes  Requested medication (s) are on the active medication list: Yes  Last refill:  07/08/20  Future visit scheduled: Yes  Notes to clinic:  Unable to refill per protocol, last refill by another provider.      Requested Prescriptions  Pending Prescriptions Disp Refills   simvastatin (ZOCOR) 10 MG tablet 90 tablet 3    Sig: Take 1 tablet (10 mg total) by mouth at bedtime.      There is no refill protocol information for this order

## 2021-04-27 ENCOUNTER — Other Ambulatory Visit: Payer: Self-pay | Admitting: Physician Assistant

## 2021-04-27 DIAGNOSIS — E7800 Pure hypercholesterolemia, unspecified: Secondary | ICD-10-CM

## 2021-04-27 NOTE — Telephone Encounter (Signed)
Copied from [place] [id number]. Topic: Quick Communication - Rx Refill/Question >> Apr 27, 2021 11:54 AM Yvette Rack wrote: Pt stated she does not use [facility] and requests that the Rx be sent to [facility]  Medication: simvastatin (ZOCOR) 10 MG tablet  Has the patient contacted their pharmacy? Yes.   (Agent: If no, request that the patient contact the pharmacy for the refill.) (Agent: If yes, when and what did the pharmacy advise?)  Preferred Pharmacy (with phone number or street name): [facility] ([place]) - [place], [place]  Phone: [phone]   Fax: [phone]  Agent: Please be advised that RX refills may take up to 3 business days. We ask that you follow-up with your pharmacy.

## 2021-05-08 ENCOUNTER — Other Ambulatory Visit: Payer: Self-pay | Admitting: Physician Assistant

## 2021-05-08 DIAGNOSIS — E7800 Pure hypercholesterolemia, unspecified: Secondary | ICD-10-CM

## 2021-05-15 NOTE — Telephone Encounter (Signed)
Patient called in to say to Dr Brita Romp  that she asked for her Rx for Simvastatin to be called in to [facility]. Would like this corrected today please and would like a call when done   Ph# [phone]

## 2021-06-01 ENCOUNTER — Encounter: Payer: Self-pay | Admitting: Family Medicine

## 2021-06-01 ENCOUNTER — Encounter: Payer: Medicare Other | Admitting: Physician Assistant

## 2021-06-01 ENCOUNTER — Other Ambulatory Visit: Payer: Self-pay

## 2021-06-01 ENCOUNTER — Ambulatory Visit (INDEPENDENT_AMBULATORY_CARE_PROVIDER_SITE_OTHER): Payer: Medicare Other | Admitting: Family Medicine

## 2021-06-01 VITALS — BP 130/67 | HR 73 | Temp 98.2°F | Resp 16 | Ht 64.5 in | Wt 156.2 lb

## 2021-06-01 DIAGNOSIS — E538 Deficiency of other specified B group vitamins: Secondary | ICD-10-CM | POA: Insufficient documentation

## 2021-06-01 DIAGNOSIS — E7800 Pure hypercholesterolemia, unspecified: Secondary | ICD-10-CM

## 2021-06-01 DIAGNOSIS — Z1321 Encounter for screening for nutritional disorder: Secondary | ICD-10-CM

## 2021-06-01 DIAGNOSIS — Z13820 Encounter for screening for osteoporosis: Secondary | ICD-10-CM

## 2021-06-01 DIAGNOSIS — Z0000 Encounter for general adult medical examination without abnormal findings: Secondary | ICD-10-CM

## 2021-06-01 DIAGNOSIS — M1711 Unilateral primary osteoarthritis, right knee: Secondary | ICD-10-CM

## 2021-06-01 DIAGNOSIS — R739 Hyperglycemia, unspecified: Secondary | ICD-10-CM

## 2021-06-01 DIAGNOSIS — F3342 Major depressive disorder, recurrent, in full remission: Secondary | ICD-10-CM

## 2021-06-01 DIAGNOSIS — K219 Gastro-esophageal reflux disease without esophagitis: Secondary | ICD-10-CM

## 2021-06-01 DIAGNOSIS — Z7189 Other specified counseling: Secondary | ICD-10-CM | POA: Insufficient documentation

## 2021-06-01 DIAGNOSIS — Z13220 Encounter for screening for lipoid disorders: Secondary | ICD-10-CM | POA: Diagnosis not present

## 2021-06-01 DIAGNOSIS — Z136 Encounter for screening for cardiovascular disorders: Secondary | ICD-10-CM

## 2021-06-01 DIAGNOSIS — J309 Allergic rhinitis, unspecified: Secondary | ICD-10-CM

## 2021-06-01 MED ORDER — CITALOPRAM HYDROBROMIDE 20 MG PO TABS: 20.0000 mg | ORAL_TABLET | Freq: Every day | ORAL | 3 refills | Status: DC

## 2021-06-01 MED ORDER — OMEPRAZOLE 20 MG PO CPDR: 20.0000 mg | DELAYED_RELEASE_CAPSULE | Freq: Every day | ORAL | 1 refills | Status: DC

## 2021-06-01 MED ORDER — SIMVASTATIN 10 MG PO TABS: 10.0000 mg | ORAL_TABLET | Freq: Every day | ORAL | 3 refills | Status: DC

## 2021-06-01 MED ORDER — FLUTICASONE PROPIONATE 50 MCG/ACT NA SUSP: NASAL | 6 refills | Status: DC

## 2021-06-01 NOTE — Assessment & Plan Note (Signed)
Elevated on previous lab work Discussed diet and exercise Pt currently doing 30 mins/daily Recommend small, frequent meals- with balanced snacks Choose water as drink of choice, goal 64 oz/wk

## 2021-06-01 NOTE — Assessment & Plan Note (Signed)
Chronic concern Well managed Using celexa daily

## 2021-06-01 NOTE — Assessment & Plan Note (Signed)
Last DEXA 2017; order placed pending scheduling

## 2021-06-01 NOTE — Assessment & Plan Note (Signed)
Currently taking OTC multi, vit D, vit B12 supplements Discussed diet- low saturated fat given prev high cholesterol

## 2021-06-01 NOTE — Assessment & Plan Note (Signed)
Labs drawn Taking supplement

## 2021-06-01 NOTE — Assessment & Plan Note (Signed)
Chronic conditions well controlled Rx refilled- mail order Lab work done for screening  No acute concerns

## 2021-06-01 NOTE — Assessment & Plan Note (Signed)
Chronic concern Well controlled Using omeprazole daily

## 2021-06-01 NOTE — Assessment & Plan Note (Signed)
Chronic concern Well controlled Using flonase daily

## 2021-06-01 NOTE — Assessment & Plan Note (Signed)
Discussed previous results Congratulated on great HDL level Encouraged additional exercise as well as low saturated fat diet Pt can add fiber with metamucil if needed

## 2021-06-01 NOTE — Assessment & Plan Note (Signed)
Ongoing concern Pt able to exercise 30 mins daily Repeat DEXA screening

## 2021-06-01 NOTE — Assessment & Plan Note (Signed)
Up to date on vaccinations; recommended Fall 2022 repeat COVID booster DEXA scan scheduled given 5 years ago Advised on need for flu vaccine- later this Fall

## 2021-06-01 NOTE — Progress Notes (Signed)
Complete physical exam   Patient: Lisa Caldwell   DOB: 08/15/1946   75 y.o. Female  MRN: [id number] Visit Date: 06/01/2021  Today's healthcare provider: Gwyneth Sprout, FNP   Chief Complaint  Patient presents with   Annual Exam   Subjective     HPI  Lisa Caldwell is a 75 y.o. female who presents today for a complete physical exam.  She reports consuming a general diet. Home exercise routine includes walking, weight lifting and tai-chi. She generally feels well. She reports sleeping well. She does not have additional problems to discuss today.  Last AWV- 05/26/2020  Last Reported Mammogram- 04/06/2021 Colonoscopy-03/09/2021 BMD- 04/05/2016   Past Medical History:  Diagnosis Date   Allergy    Anxiety    Cancer ([place])    colon cancer   Depression    GERD (gastroesophageal reflux disease)    Headache(784.0)    Inflammatory polyps of colon with rectal bleeding ([place]) 2008    rt colectomy for a polyp with carcinoma in situ    Personal history of malignant neoplasm of large intestine    Past Surgical History:  Procedure Laterality Date   [place] Right 2008   COLONOSCOPY  2012   Dr. Jamal Collin   COLONOSCOPY WITH PROPOFOL N/A 03/10/2016   Procedure: COLONOSCOPY WITH PROPOFOL;  Surgeon: [name], MD;  Location: [facility] ENDOSCOPY;  Service: Endoscopy;  Laterality: N/A;   COLONOSCOPY WITH PROPOFOL N/A 03/09/2021   Procedure: COLONOSCOPY WITH PROPOFOL;  Surgeon: Jonathon Bellows, MD;  Location: [facility] ENDOSCOPY;  Service: Gastroenterology;  Laterality: N/A;   FOOT SURGERY Left 2012   FOOT SURGERY Left 2013   THYROGLOSSAL DUCT CYST N/A 09/04/2015   Procedure: THYROGLOSSAL DUCT CYST;  Surgeon: Carloyn Manner, MD;  Location: [facility] ORS;  Service: ENT;  Laterality: N/A;   Social History   Socioeconomic History   Marital status: Married    Spouse name: Tom   Number of children: 2   Years of education: Not on file   Highest  education level: 12th grade  Occupational History   Occupation: Retired  Tobacco Use   Smoking status: Never   Smokeless tobacco: Never  Substance and Sexual Activity   Alcohol use: Yes    Alcohol/week: 14.0 standard drinks    Types: 14 Cans of beer per week    Comment: 2 a day   Drug use: No   Sexual activity: Never    Birth control/protection: Other-see comments    Comment: hysterectomy  Other Topics Concern   Not on file  Social History Narrative   Not on file   Social Determinants of Health   Financial Resource Strain: Not on file  Food Insecurity: Not on file  Transportation Needs: Not on file  Physical Activity: Not on file  Stress: Not on file  Social Connections: Not on file  Intimate Partner Violence: Not on file   Family Status  Relation Name Status   Mother  Deceased at age 88   Father  Other   Brother  Deceased at age 71       heart disease   Neg Hx  (Not Specified)   Family History  Problem Relation Age of Onset   Heart disease Mother    Heart disease Father    Drug abuse Brother    Breast cancer Neg Hx    Allergies  Allergen Reactions   Codeine Nausea And Vomiting  Peanut-Containing Drug Products Diarrhea and Nausea And Vomiting    Pine nut   Phenergan [Promethazine Hcl] Other (See Comments)    tremors    Patient Care Team: Gwyneth Sprout, FNP as PCP - General (Family Medicine)   Medications: Outpatient Medications Prior to Visit  Medication Sig   aspirin 81 MG tablet Take 81 mg by mouth daily.   Cholecalciferol (VITAMIN D3 PO) Take by mouth daily.    Multiple Vitamin (MULTIVITAMIN) tablet Take 1 tablet by mouth daily.   vitamin B-12 (CYANOCOBALAMIN) 1000 MCG tablet Take 1,000 mcg by mouth daily.   [DISCONTINUED] citalopram (CELEXA) 20 MG tablet TAKE 1 TABLET BY MOUTH  DAILY   [DISCONTINUED] fluticasone (FLONASE) 50 MCG/ACT nasal spray USE 2 SPRAYS IN BOTH  NOSTRILS DAILY   [DISCONTINUED] omeprazole (PRILOSEC) 20 MG capsule TAKE 1  CAPSULE BY MOUTH  TWICE DAILY BEFORE MEALS   [DISCONTINUED] simvastatin (ZOCOR) 10 MG tablet TAKE 1 TABLET BY MOUTH AT  BEDTIME   [DISCONTINUED] albuterol (VENTOLIN HFA) 108 (90 Base) MCG/ACT inhaler Inhale 2 puffs into the lungs every 6 (six) hours as needed for wheezing or shortness of breath. (Patient not taking: Reported on 11/20/2020)   [DISCONTINUED] diphenhydrAMINE (BENADRYL) 25 MG tablet Take 25 mg by mouth daily. (Patient not taking: Reported on 11/20/2020)   [DISCONTINUED] meloxicam (MOBIC) 7.5 MG tablet TAKE 1 TABLET BY MOUTH EVERY DAY (Patient not taking: Reported on 11/20/2020)   No facility-administered medications prior to visit.    Review of Systems  All other systems reviewed and are negative.    Objective    There were no vitals taken for this visit.   Physical Exam Vitals and nursing note reviewed.  Constitutional:      General: She is awake. She is not in acute distress.    Appearance: Normal appearance. She is well-developed and well-groomed. She is not ill-appearing or toxic-appearing.  HENT:     Head: Normocephalic and atraumatic.     Jaw: There is normal jaw occlusion. No trismus, tenderness, swelling or pain on movement.     Salivary Glands: Right salivary gland is not diffusely enlarged or tender. Left salivary gland is not diffusely enlarged or tender.     Right Ear: Tympanic membrane, ear canal and external ear normal. Decreased hearing noted. There is no impacted cerumen.     Left Ear: Tympanic membrane, ear canal and external ear normal. Decreased hearing noted. There is no impacted cerumen.     Ears:     Comments: Hearing difficulty- pt noticed since people started masking    Nose: Nose normal. No congestion or rhinorrhea.     Right Turbinates: Not enlarged, swollen or pale.     Left Turbinates: Not swollen or pale.     Right Sinus: No maxillary sinus tenderness or frontal sinus tenderness.     Left Sinus: No maxillary sinus tenderness or frontal sinus  tenderness.     Mouth/Throat:     Lips: Pink.     Mouth: Mucous membranes are moist.     Tongue: No lesions. Tongue does not deviate from midline.     Pharynx: Oropharynx is clear. Uvula midline. No pharyngeal swelling, oropharyngeal exudate, posterior oropharyngeal erythema or uvula swelling.     Tonsils: No tonsillar exudate or tonsillar abscesses.     Comments: Routine dental care every 6 months Eyes:     General: Lids are normal. Lids are everted, no foreign bodies appreciated. Vision grossly intact. Gaze aligned appropriately. No allergic shiner, visual  field deficit or scleral icterus.       Right eye: No discharge.        Left eye: No discharge.     Extraocular Movements: Extraocular movements intact.     Conjunctiva/sclera: Conjunctivae normal.     Right eye: Right conjunctiva is not injected.     Left eye: Left conjunctiva is not injected.     Pupils: Pupils are equal, round, and reactive to light.     Comments: Wears eyeglasses Routine eye care every year  Neck:     Thyroid: No thyroid mass or thyromegaly.     Vascular: Normal carotid pulses. No carotid bruit, hepatojugular reflux or JVD.     Trachea: Trachea normal.  Cardiovascular:     Rate and Rhythm: Normal rate and regular rhythm.     Pulses: Normal pulses.          Carotid pulses are 2+ on the right side and 2+ on the left side.      Radial pulses are 2+ on the right side and 2+ on the left side.       Femoral pulses are 2+ on the right side and 2+ on the left side.      Popliteal pulses are 2+ on the right side and 2+ on the left side.       Dorsalis pedis pulses are 2+ on the right side and 2+ on the left side.       Posterior tibial pulses are 2+ on the right side and 2+ on the left side.     Heart sounds: Normal heart sounds, S1 normal and S2 normal. No murmur heard.   No friction rub. No gallop.  Pulmonary:     Effort: Pulmonary effort is normal. No respiratory distress.     Breath sounds: Normal breath sounds  and air entry. No stridor. No wheezing, rhonchi or rales.  Chest:     Chest wall: No tenderness.     Comments: Exam deferred; discussed personal exam Reference of "know your lemons" provided, [email or web address] Abdominal:     General: Abdomen is flat. Bowel sounds are normal. There is no distension.     Palpations: Abdomen is soft. There is no mass.     Tenderness: There is no abdominal tenderness. There is no guarding or rebound.     Hernia: No hernia is present.  Genitourinary:    Comments: Exam deferred; denies complaints Musculoskeletal:        General: No swelling. Normal range of motion.     Cervical back: Full passive range of motion without pain, normal range of motion and neck supple. No edema, rigidity, tenderness or crepitus. No pain with movement.     Right lower leg: No edema.     Left lower leg: No edema.     Comments: R OA- chronic concern, does not interfere with ADLs  Lymphadenopathy:     Head:     Right side of head: No submental, submandibular, tonsillar, preauricular, posterior auricular or occipital adenopathy.     Left side of head: No submental, submandibular, tonsillar, preauricular, posterior auricular or occipital adenopathy.     Cervical: No cervical adenopathy.     Right cervical: No superficial, deep or posterior cervical adenopathy.    Left cervical: No superficial, deep or posterior cervical adenopathy.  Skin:    General: Skin is warm and dry.     Capillary Refill: Capillary refill takes less than 2 seconds.     Coloration: Skin is  not pale.     Findings: No bruising, erythema, lesion or rash.  Neurological:     General: No focal deficit present.     Mental Status: She is alert and oriented to person, place, and time.     Motor: Motor function is intact.     Coordination: Coordination is intact.     Gait: Gait is intact.  Psychiatric:        Attention and Perception: Attention and perception normal.        Mood and Affect: Mood and affect  normal.        Speech: Speech normal.        Behavior: Behavior normal. Behavior is cooperative.        Thought Content: Thought content normal.        Cognition and Memory: Cognition normal.        Judgment: Judgment normal.      Last depression screening scores PHQ 2/9 Scores 06/01/2021 11/15/2019 05/22/2019  PHQ - 2 Score 0 1 0  PHQ- 9 Score 1 2 -   Last fall risk screening Fall Risk  06/01/2021  Falls in the past year? 0  Number falls in past yr: 0  Injury with Fall? 0  Follow up -   Last Audit-C alcohol use screening Alcohol Use Disorder Test (AUDIT) 05/26/2020  1. How often do you have a drink containing alcohol? 4  2. How many drinks containing alcohol do you have on a typical day when you are drinking? 0  3. How often do you have six or more drinks on one occasion? 0  AUDIT-C Score 4  4. How often during the last year have you found that you were not able to stop drinking once you had started? 0  5. How often during the last year have you failed to do what was normally expected from you because of drinking? 0  6. How often during the last year have you needed a first drink in the morning to get yourself going after a heavy drinking session? 0  7. How often during the last year have you had a feeling of guilt of remorse after drinking? 0  8. How often during the last year have you been unable to remember what happened the night before because you had been drinking? 0  9. Have you or someone else been injured as a result of your drinking? 0  10. Has a relative or friend or a doctor or another health worker been concerned about your drinking or suggested you cut down? 0  Alcohol Use Disorder Identification Test Final Score (AUDIT) 4  Alcohol Brief Interventions/Follow-up AUDIT Score <7 follow-up not indicated   A score of 3 or more in women, and 4 or more in men indicates increased risk for alcohol abuse, EXCEPT if all of the points are from question 1   No results found for any  visits on 06/01/21.  Assessment & Plan    Routine Health Maintenance and Physical Exam  Exercise Activities and Dietary recommendations  Goals      Prevent falls     Recommend to remove any items from the home that may cause slips or trips.     Reduce alcohol intake to 1 serving a day.        Patient has reduced her ETOH consumption from 14 to 10 drinks/wk  Immunization History  Administered Date(s) Administered   H1N1 09/25/2008   Influenza Split 07/16/2010, 07/28/2011, 08/08/2012   Influenza, High  Dose Seasonal PF 07/16/2014, 07/19/2015, 07/09/2016, 07/21/2017, 07/19/2018, 07/03/2020   Influenza,inj,Quad PF,6+ Mos 07/12/2013   PFIZER Comirnaty(Gray Top)Covid-19 Tri-Sucrose Vaccine 01/30/2021   PFIZER(Purple Top)SARS-COV-2 Vaccination 12/08/2019, 01/01/2020   Pneumococcal Conjugate-13 05/05/2015   Pneumococcal Polysaccharide-23 08/27/2011   Tdap 01/11/2006   Zoster Recombinat (Shingrix) 05/16/2017, 07/21/2017   Zoster, Live 08/17/2011    Health Maintenance  Topic Date Due   DEXA SCAN  04/05/2021   COVID-19 Vaccine (4 - Booster for Pfizer series) 06/01/2021   INFLUENZA VACCINE  10/18/2021 (Originally 05/18/2021)   TETANUS/TDAP  10/18/2026 (Originally 01/12/2016)   MAMMOGRAM  04/06/2022   COLONOSCOPY (Pts 45-37yr Insurance coverage will need to be confirmed)  03/09/2026   Hepatitis C Screening  Completed   PNA vac Low Risk Adult  Completed   Zoster Vaccines- Shingrix  Completed   HPV VACCINES  Aged Out    Discussed health benefits of physical activity, and encouraged her to engage in regular exercise appropriate for her age and condition.  Problem List Items Addressed This Visit       Respiratory   Allergic rhinitis    Chronic concern Well controlled Using flonase daily       Relevant Medications   fluticasone (FLONASE) 50 MCG/ACT nasal spray     Digestive   Acid reflux    Chronic concern Well controlled Using omeprazole daily       Relevant  Medications   omeprazole (PRILOSEC) 20 MG capsule     Musculoskeletal and Integument   Primary osteoarthritis of right knee    Ongoing concern Pt able to exercise 30 mins daily Repeat DEXA screening        Other   Elevated blood sugar    Elevated on previous lab work Discussed diet and exercise Pt currently doing 30 mins/daily Recommend small, frequent meals- with balanced snacks Choose water as drink of choice, goal 64 oz/wk      Recurrent major depressive disorder, in full remission ([place]    Chronic concern Well managed Using celexa daily       Relevant Medications   citalopram (CELEXA) 20 MG tablet   Encounter for Medicare annual wellness exam - Primary    Up to date on vaccinations; recommended Fall 2022 repeat COVID booster DEXA scan scheduled given 5 years ago Advised on need for flu vaccine- later this Fall       Annual physical exam    Chronic conditions well controlled Rx refilled- mail order Lab work done for screening  No acute concerns      Encounter for vitamin deficiency screening    Currently taking OTC multi, vit D, vit B12 supplements Discussed diet- low saturated fat given prev high cholesterol      Relevant Orders   VITAMIN D 25 Hydroxy (Vit-D Deficiency, Fractures)   Vitamin B 12 deficiency    Labs drawn Taking supplement      Relevant Orders   B12   Hypercholesterolemia    Discussed previous results Congratulated on great HDL level Encouraged additional exercise as well as low saturated fat diet Pt can add fiber with metamucil if needed      Relevant Medications   simvastatin (ZOCOR) 10 MG tablet   Screening for osteoporosis    Last DEXA 2017; order placed pending scheduling      Relevant Orders   DG Bone Density     Return in about 1 year (around 06/01/2022) for annual examination.    IVonna Kotyk FNP, have reviewed all documentation for  this visit. The documentation on 06/01/21 for the exam, diagnosis, procedures,  and orders are all accurate and complete.    Gwyneth Sprout, [place] [phone] (phone) [phone] (fax)  [place]

## 2021-06-02 ENCOUNTER — Encounter: Payer: Self-pay | Admitting: Family Medicine

## 2021-06-02 DIAGNOSIS — Z0000 Encounter for general adult medical examination without abnormal findings: Secondary | ICD-10-CM

## 2021-06-02 LAB — VITAMIN B12: Vitamin B-12: 1652 pg/mL — ABNORMAL HIGH (ref 232–1245)

## 2021-06-02 LAB — VITAMIN D 25 HYDROXY (VIT D DEFICIENCY, FRACTURES): Vit D, 25-Hydroxy: 51.7 ng/mL (ref 30.0–100.0)

## 2021-06-10 ENCOUNTER — Other Ambulatory Visit: Payer: Self-pay | Admitting: Family Medicine

## 2021-06-13 LAB — CBC WITH DIFFERENTIAL/PLATELET
Basophils Absolute: 0.1 10*3/uL (ref 0.0–0.2)
Basos: 1 %
EOS (ABSOLUTE): 0.1 10*3/uL (ref 0.0–0.4)
Eos: 2 %
Hematocrit: 39.4 % (ref 34.0–46.6)
Hemoglobin: 13.2 g/dL (ref 11.1–15.9)
Immature Grans (Abs): 0 10*3/uL (ref 0.0–0.1)
Immature Granulocytes: 0 %
Lymphocytes Absolute: 1.5 10*3/uL (ref 0.7–3.1)
Lymphs: 23 %
MCH: 33.7 pg — ABNORMAL HIGH (ref 26.6–33.0)
MCHC: 33.5 g/dL (ref 31.5–35.7)
MCV: 101 fL — ABNORMAL HIGH (ref 79–97)
Monocytes Absolute: 0.5 10*3/uL (ref 0.1–0.9)
Monocytes: 8 %
Neutrophils Absolute: 4.1 10*3/uL (ref 1.4–7.0)
Neutrophils: 66 %
Platelets: 248 10*3/uL (ref 150–450)
RBC: 3.92 x10E6/uL (ref 3.77–5.28)
RDW: 11.7 % (ref 11.7–15.4)
WBC: 6.3 10*3/uL (ref 3.4–10.8)

## 2021-06-13 LAB — LIPID PANEL
Chol/HDL Ratio: 2.6 ratio (ref 0.0–4.4)
Cholesterol, Total: 201 mg/dL — ABNORMAL HIGH (ref 100–199)
HDL: 77 mg/dL (ref >39)
LDL Chol Calc (NIH): 110 mg/dL — ABNORMAL HIGH (ref 0–99)
Triglycerides: 78 mg/dL (ref 0–149)
VLDL Cholesterol Cal: 14 mg/dL (ref 5–40)

## 2021-06-13 LAB — COMPREHENSIVE METABOLIC PANEL
ALT: 18 IU/L (ref 0–32)
AST: 27 IU/L (ref 0–40)
Albumin/Globulin Ratio: 1.7 (ref 1.2–2.2)
Albumin: 4.3 g/dL (ref 3.7–4.7)
Alkaline Phosphatase: 90 IU/L (ref 44–121)
BUN/Creatinine Ratio: 10 — ABNORMAL LOW (ref 12–28)
BUN: 8 mg/dL (ref 8–27)
Bilirubin Total: 0.6 mg/dL (ref 0.0–1.2)
CO2: 19 mmol/L — ABNORMAL LOW (ref 20–29)
Calcium: 9.3 mg/dL (ref 8.7–10.3)
Chloride: 107 mmol/L — ABNORMAL HIGH (ref 96–106)
Creatinine, Ser: 0.81 mg/dL (ref 0.57–1.00)
Globulin, Total: 2.6 g/dL (ref 1.5–4.5)
Glucose: 96 mg/dL (ref 65–99)
Potassium: 4.2 mmol/L (ref 3.5–5.2)
Sodium: 142 mmol/L (ref 134–144)
Total Protein: 6.9 g/dL (ref 6.0–8.5)
eGFR: 76 mL/min/{1.73_m2} (ref >59)

## 2021-06-13 LAB — MAGNESIUM: Magnesium: 1.4 mg/dL — ABNORMAL LOW (ref 1.6–2.3)

## 2021-06-13 LAB — HEMOGLOBIN A1C
Est. average glucose Bld gHb Est-mCnc: 108 mg/dL
Hgb A1c MFr Bld: 5.4 % (ref 4.8–5.6)

## 2021-06-13 LAB — TSH: TSH: 2.02 u[IU]/mL (ref 0.450–4.500)

## 2021-06-25 ENCOUNTER — Other Ambulatory Visit: Payer: Self-pay

## 2021-06-25 ENCOUNTER — Ambulatory Visit
Admission: RE | Admit: 2021-06-25 | Discharge: 2021-06-25 | Disposition: A | Discharge status: Home / Self Care | Payer: Medicare Other | Source: Ambulatory Visit | Attending: Family Medicine | Admitting: Family Medicine

## 2021-06-25 DIAGNOSIS — Z780 Asymptomatic menopausal state: Secondary | ICD-10-CM | POA: Diagnosis not present

## 2021-06-25 DIAGNOSIS — Z13820 Encounter for screening for osteoporosis: Secondary | ICD-10-CM | POA: Diagnosis not present

## 2021-06-25 DIAGNOSIS — M8589 Other specified disorders of bone density and structure, multiple sites: Secondary | ICD-10-CM | POA: Insufficient documentation

## 2021-07-21 ENCOUNTER — Other Ambulatory Visit: Payer: Self-pay | Admitting: Family Medicine

## 2021-07-29 ENCOUNTER — Other Ambulatory Visit: Payer: Self-pay | Admitting: Family Medicine

## 2021-08-07 ENCOUNTER — Emergency Department: Payer: Medicare Other

## 2021-08-07 ENCOUNTER — Ambulatory Visit: Payer: Self-pay

## 2021-08-07 ENCOUNTER — Emergency Department
Admission: EM | Admit: 2021-08-07 | Discharge: 2021-08-07 | Disposition: A | Discharge status: Home / Self Care | Payer: Medicare Other | Attending: Emergency Medicine | Admitting: Emergency Medicine

## 2021-08-07 ENCOUNTER — Other Ambulatory Visit: Payer: Self-pay

## 2021-08-07 DIAGNOSIS — I4891 Unspecified atrial fibrillation: Secondary | ICD-10-CM | POA: Diagnosis not present

## 2021-08-07 DIAGNOSIS — Z85038 Personal history of other malignant neoplasm of large intestine: Secondary | ICD-10-CM | POA: Diagnosis not present

## 2021-08-07 DIAGNOSIS — Z91010 Allergy to peanuts: Secondary | ICD-10-CM | POA: Diagnosis not present

## 2021-08-07 DIAGNOSIS — Z7982 Long term (current) use of aspirin: Secondary | ICD-10-CM | POA: Diagnosis not present

## 2021-08-07 DIAGNOSIS — Z79899 Other long term (current) drug therapy: Secondary | ICD-10-CM | POA: Insufficient documentation

## 2021-08-07 DIAGNOSIS — R0789 Other chest pain: Secondary | ICD-10-CM | POA: Diagnosis present

## 2021-08-07 DIAGNOSIS — R42 Dizziness and giddiness: Secondary | ICD-10-CM | POA: Insufficient documentation

## 2021-08-07 LAB — CBC
HCT: 41.3 % (ref 36.0–46.0)
Hemoglobin: 14.5 g/dL (ref 12.0–15.0)
MCH: 34.6 pg — ABNORMAL HIGH (ref 26.0–34.0)
MCHC: 35.1 g/dL (ref 30.0–36.0)
MCV: 98.6 fL (ref 80.0–100.0)
Platelets: 246 10*3/uL (ref 150–400)
RBC: 4.19 MIL/uL (ref 3.87–5.11)
RDW: 12.2 % (ref 11.5–15.5)
WBC: 8 10*3/uL (ref 4.0–10.5)
nRBC: 0 % (ref 0.0–0.2)

## 2021-08-07 LAB — BASIC METABOLIC PANEL
Anion gap: 8 (ref 5–15)
BUN: 18 mg/dL (ref 8–23)
CO2: 25 mmol/L (ref 22–32)
Calcium: 9.4 mg/dL (ref 8.9–10.3)
Chloride: 108 mmol/L (ref 98–111)
Creatinine, Ser: 0.85 mg/dL (ref 0.44–1.00)
GFR, Estimated: >60 mL/min (ref >60)
Glucose, Bld: 104 mg/dL — ABNORMAL HIGH (ref 70–99)
Potassium: 3.8 mmol/L (ref 3.5–5.1)
Sodium: 141 mmol/L (ref 135–145)

## 2021-08-07 LAB — TROPONIN I (HIGH SENSITIVITY): Troponin I (High Sensitivity): 22 ng/L — ABNORMAL HIGH (ref <18)

## 2021-08-07 MED ORDER — DILTIAZEM HCL 25 MG/5ML IV SOLN
15.0000 mg | Freq: Once | INTRAVENOUS | Status: AC
  Administered 2021-08-07: 15 mg via INTRAVENOUS

## 2021-08-07 MED ORDER — METOPROLOL SUCCINATE ER 25 MG PO TB24: 50.0000 mg | ORAL_TABLET | Freq: Every day | ORAL | 1 refills | Status: DC

## 2021-08-07 MED ORDER — METOPROLOL TARTRATE 25 MG PO TABS
25.0000 mg | ORAL_TABLET | Freq: Once | ORAL | Status: AC
  Administered 2021-08-07: 25 mg via ORAL
  Filled 2021-08-07: qty 1

## 2021-08-07 MED ORDER — DILTIAZEM HCL 25 MG/5ML IV SOLN
INTRAVENOUS | Status: AC
  Filled 2021-08-07: qty 5

## 2021-08-07 MED ORDER — APIXABAN 5 MG PO TABS: 5.0000 mg | ORAL_TABLET | Freq: Two times a day (BID) | ORAL | 1 refills | Status: DC

## 2021-08-07 NOTE — ED Notes (Signed)
Pt wanting to leave, and is tired of waiting. MD Aware

## 2021-08-07 NOTE — ED Provider Notes (Signed)
[facility] Emergency Department Provider Note  ____________________________________________   I have reviewed the triage vital signs and the nursing notes.   HISTORY  Chief Complaint Lightheadedness.   History limited by: Not Limited   HPI Lisa Caldwell is a 75 y.o. female who presents to the emergency department today because of concern for lightheadedness.  The patient states that she first had an episode roughly 3 weeks ago.  She felt lightheaded and dizzy.  Felt like she might pass out.  At that time she checked her heart rate and was in the 150s.  Resolved on its own and for the past 3 weeks she has been feeling good until this morning when she had the same symptoms return.  She did feel some chest discomfort with the symptoms today.  Patient denies any unusual caffeine intake.  Records reviewed. Per medical record review patient has a history of HLD  Past Medical History:  Diagnosis Date   Allergy    Anxiety    Cancer ([place])    colon cancer   Depression    GERD (gastroesophageal reflux disease)    Headache(784.0)    Inflammatory polyps of colon with rectal bleeding ([place]) 2008    rt colectomy for a polyp with carcinoma in situ    Personal history of malignant neoplasm of large intestine     Patient Active Problem List   Diagnosis Date Noted   Encounter for Medicare annual wellness exam 06/01/2021   Annual physical exam 06/01/2021   Encounter for vitamin deficiency screening 06/01/2021   Primary osteoarthritis of right knee 06/01/2021   Vitamin B 12 deficiency 06/01/2021   Hypercholesterolemia 06/01/2021   Screening for osteoporosis 06/01/2021   Chronic allergic rhinitis 06/01/2021   Recurrent major depressive disorder, in full remission ([place]) 05/26/2020   Right facial numbness 05/19/2018   Alcohol abuse 05/16/2016   Osteopenia 04/06/2016   History of thyroglossal duct cyst removal 09/04/2015   Persistent thyroglossal duct cyst 07/25/2015    Adaptation reaction 03/04/2015   Colon, diverticulosis 03/04/2015   Elevated blood sugar 03/04/2015   Acid reflux 03/04/2015   High potassium 03/04/2015   Hammer toe 03/04/2015   Headache, migraine 03/04/2015   Allergic rhinitis 04/03/2013   Clinical depression 04/03/2013   History of colon cancer 01/31/2013    Past Surgical History:  Procedure Laterality Date   ABDOMINAL HYSTERECTOMY  1996   APPENDECTOMY     COLECTOMY Right 2008   COLONOSCOPY  2012   Dr. Jamal Collin   COLONOSCOPY WITH PROPOFOL N/A 03/10/2016   Procedure: COLONOSCOPY WITH PROPOFOL;  Surgeon: [name], MD;  Location: [facility] ENDOSCOPY;  Service: Endoscopy;  Laterality: N/A;   COLONOSCOPY WITH PROPOFOL N/A 03/09/2021   Procedure: COLONOSCOPY WITH PROPOFOL;  Surgeon: Jonathon Bellows, MD;  Location: [facility] ENDOSCOPY;  Service: Gastroenterology;  Laterality: N/A;   FOOT SURGERY Left 2012   FOOT SURGERY Left 2013   THYROGLOSSAL DUCT CYST N/A 09/04/2015   Procedure: THYROGLOSSAL DUCT CYST;  Surgeon: Carloyn Manner, MD;  Location: [facility] ORS;  Service: ENT;  Laterality: N/A;    Prior to Admission medications   Medication Sig Start Date End Date Taking? Authorizing Provider  aspirin 81 MG tablet Take 81 mg by mouth daily.    [provider]  Cholecalciferol (VITAMIN D3 PO) Take by mouth daily.     [provider]  citalopram (CELEXA) 20 MG tablet Take 1 tablet (20 mg total) by mouth daily. 06/01/21   Gwyneth Sprout, FNP  fluticasone (FLONASE) 50 MCG/ACT nasal spray USE 2 SPRAYS IN BOTH  NOSTRILS DAILY 06/01/21   Tally Joe T, FNP  Multiple Vitamin (MULTIVITAMIN) tablet Take 1 tablet by mouth daily.    [provider]  omeprazole (PRILOSEC) 20 MG capsule Take 1 capsule (20 mg total) by mouth daily. 06/01/21   Gwyneth Sprout, FNP  simvastatin (ZOCOR) 10 MG tablet Take 1 tablet (10 mg total) by mouth at bedtime. 06/01/21   Gwyneth Sprout, FNP  vitamin B-12 (CYANOCOBALAMIN) 1000 MCG tablet Take 1,000  mcg by mouth daily.    [provider]    Allergies Codeine, Peanut-containing drug products, and Phenergan [promethazine hcl]  Family History  Problem Relation Age of Onset   Heart disease Mother    Heart disease Father    Drug abuse Brother    Breast cancer Neg Hx     Social History Social History   Tobacco Use   Smoking status: Never   Smokeless tobacco: Never  Substance Use Topics   Alcohol use: Yes    Alcohol/week: 14.0 standard drinks    Types: 14 Cans of beer per week    Comment: 2 a day   Drug use: No    Review of Systems Constitutional: No fever/chills Eyes: No visual changes. ENT: No sore throat. Cardiovascular: Positive for chest pain. Respiratory: Denies shortness of breath. Gastrointestinal: No abdominal pain.  No nausea, no vomiting.  No diarrhea.   Genitourinary: Negative for dysuria. Musculoskeletal: Negative for back pain. Skin: Negative for rash. Neurological: Positive for lightheadedness.  ____________________________________________   PHYSICAL EXAM:  VITAL SIGNS: ED Triage Vitals  Enc Vitals Group     BP 08/07/21 1559 (!) 152/100     Pulse Rate 08/07/21 1559 91     Resp 08/07/21 1559 16     Temp 08/07/21 1559 98.3 F (36.8 C)     Temp Source 08/07/21 1559 Oral     SpO2 08/07/21 1559 98 %     Weight 08/07/21 1601 155 lb (70.3 kg)     Height 08/07/21 1601 5' 4.5" (1.638 m)     Head Circumference --      Peak Flow --      Pain Score 08/07/21 1601 0   Constitutional: Alert and oriented.  Eyes: Conjunctivae are normal.  ENT      Head: Normocephalic and atraumatic.      Nose: No congestion/rhinnorhea.      Mouth/Throat: Mucous membranes are moist.      Neck: No stridor. Hematological/Lymphatic/Immunilogical: No cervical lymphadenopathy. Cardiovascular: Normal rate, irregular rhythm.  No murmurs, rubs, or gallops.  Respiratory: Normal respiratory effort without tachypnea nor retractions. Breath sounds are clear and equal  bilaterally. No wheezes/rales/rhonchi. Gastrointestinal: Soft and non tender. No rebound. No guarding.  Genitourinary: Deferred Musculoskeletal: Normal range of motion in all extremities. No lower extremity edema. Neurologic:  Normal speech and language. No gross focal neurologic deficits are appreciated.  Skin:  Skin is warm, dry and intact. No rash noted. Psychiatric: Mood and affect are normal. Speech and behavior are normal. Patient exhibits appropriate insight and judgment.  ____________________________________________    LABS (pertinent positives/negatives)  BMP wnl except glu 104 CBC wbc 8.0, hgb 14.5, plt 246 Trop hs 22 ____________________________________________   EKG  I, Nance Pear, attending physician, personally viewed and interpreted this EKG  EKG Time: 1607 Rate: 131 Rhythm: atrial fibrillation with rvr Axis: normal  Intervals: qtc 375 QRS: narrow ST changes: no st elevation Impression: abnormal ekg  ____________________________________________    RADIOLOGY  CXR Stable chest.  ____________________________________________   PROCEDURES  Procedures  ____________________________________________   INITIAL IMPRESSION / ASSESSMENT AND PLAN / ED COURSE  Pertinent labs & imaging results that were available during my care of the patient were reviewed by me and considered in my medical decision making (see chart for details).   Patient presented to the emergency department today feeling lightheaded.  On exam patient was found to be in A. fib with RVR.  No history of atrial fibrillation.  She was given 1 dose of IV diltiazem which did bring her rate down significantly.  She was then given an oral metoprolol.  Her rate stayed well controlled here in the emergency department.  Initial troponin was mildly elevated.  I do have low concern for ACS.  Did however want to draw and check second troponin.  Patient however declined to stay for results of second  troponin.  I did discuss with patient that I truly do have low suspicion for heart damage however did want to check second complaint.  However given the patient wants to leave will discharge with prescription for metoprolol and blood thinners.  Discussed importance of follow-up with physician.    ____________________________________________   FINAL CLINICAL IMPRESSION(S) / ED DIAGNOSES  Final diagnoses:  Atrial fibrillation, unspecified type [facility])     Note: This dictation was prepared with Dragon dictation. Any transcriptional errors that result from this process are unintentional     Nance Pear, MD 08/07/21 2028

## 2021-08-07 NOTE — ED Triage Notes (Signed)
Pt to ER via POV with complaints of recurrent episodes of chest heaviness. Reports first episode happened 3 weeks ago. States at that time her BP was elevated and her HR reached 150.   This morning states she was in the grocery store when she suddenly felt lightheaded/ was experiencing centralized chest pressure. Reports feeling normal now.

## 2021-08-07 NOTE — ED Notes (Signed)
Ppw provided pt informed that they will be called with troponin result if elevated. Pt iv removed, pt assisted off unit on foot

## 2021-08-07 NOTE — ED Notes (Signed)
Pt asking when they can go home. Pt informed that we are waiting the result for their second trop results and they can leave.

## 2021-08-07 NOTE — Discharge Instructions (Addendum)
Please seek medical attention for any high fevers, chest pain, shortness of breath, change in behavior, persistent vomiting, bloody stool or any other new or concerning symptoms.  

## 2021-08-07 NOTE — Telephone Encounter (Signed)
Pt called in stating she has been experiencing increased HR and dizziness, lightheadedness and feels like chest is heavy and tight. This happened 3 weeks ago for a few mins and now has started again today. Pt says she felt it this morning but it went away, went to the grocery store and came home d/t feeling like she was going to pass out. BP when she got home approx 30 mins ago was 129/74 HR 117 and now it is 122/103 HR 117. Pt takes BP and HR using automatic cuff. She states that 3 weeks ago her BP was 140/70 HR 155. She says her HR is normally 50-60. Pt stated she was lightheaded at present and advised that she was sitting down. Advised pt that she needed to go to ED to get evaluated and asked if there was anyone there that could take her. Pt says her husband was home and would take her. Care advice given and pt verbalized understanding.    Reason for Disposition  Dizziness, lightheadedness, or weakness  Answer Assessment - Initial Assessment Questions 1. DESCRIPTION: "Please describe your heart rate or heartbeat that you are having" (e.g., fast/slow, regular/irregular, skipped or extra beats, "palpitations")     Chest feel heavy and tight 2. ONSET: "When did it start?" (Minutes, hours or days)      3 week first then today, 30 mins to hr ago  3. DURATION: "How long does it last" (e.g., seconds, minutes, hours)     Just a few mins  4. PATTERN "Does it come and go, or has it been constant since it started?"  "Does it get worse with exertion?"   "Are you feeling it now?"     Come and go  5. TAP: "Using your hand, can you tap out what you are feeling on a chair or table in front of you, so that I can hear?" (Note: not all patients can do this)       No 6. HEART RATE: "Can you tell me your heart rate?" "How many beats in 15 seconds?"  (Note: not all patients can do this)       117 7. RECURRENT SYMPTOM: "Have you ever had this before?" If Yes, ask: "When was the last time?" and "What happened that  time?"      Yes 1 time 3 weeks ago  8. CAUSE: "What do you think is causing the palpitations?"     Not sure 9. CARDIAC HISTORY: "Do you have any history of heart disease?" (e.g., heart attack, angina, bypass surgery, angioplasty, arrhythmia)      no 10. OTHER SYMPTOMS: "Do you have any other symptoms?" (e.g., dizziness, chest pain, sweating, difficulty breathing)       Dizziness, chest tightness, SOB at times, lightheaded 11. PREGNANCY: "Is there any chance you are pregnant?" "When was your last menstrual period?"       No  Protocols used: Heart Rate and Heartbeat Questions-A-AH

## 2021-08-07 NOTE — ED Notes (Signed)
Repeat trop resent. Pt assisted to toilet. Pt denies needs at this time.

## 2021-08-17 ENCOUNTER — Encounter: Payer: Self-pay | Admitting: Internal Medicine

## 2021-08-17 ENCOUNTER — Ambulatory Visit (INDEPENDENT_AMBULATORY_CARE_PROVIDER_SITE_OTHER): Payer: Medicare Other | Admitting: Internal Medicine

## 2021-08-17 ENCOUNTER — Other Ambulatory Visit: Payer: Self-pay

## 2021-08-17 VITALS — BP 132/78 | HR 61 | Ht 64.5 in | Wt 155.9 lb

## 2021-08-17 DIAGNOSIS — E7800 Pure hypercholesterolemia, unspecified: Secondary | ICD-10-CM

## 2021-08-17 DIAGNOSIS — M1711 Unilateral primary osteoarthritis, right knee: Secondary | ICD-10-CM | POA: Diagnosis not present

## 2021-08-17 DIAGNOSIS — K219 Gastro-esophageal reflux disease without esophagitis: Secondary | ICD-10-CM

## 2021-08-17 DIAGNOSIS — F1010 Alcohol abuse, uncomplicated: Secondary | ICD-10-CM

## 2021-08-17 DIAGNOSIS — F3342 Major depressive disorder, recurrent, in full remission: Secondary | ICD-10-CM | POA: Diagnosis not present

## 2021-08-17 DIAGNOSIS — I480 Paroxysmal atrial fibrillation: Secondary | ICD-10-CM

## 2021-08-17 MED ORDER — APIXABAN 5 MG PO TABS: 5.0000 mg | ORAL_TABLET | Freq: Two times a day (BID) | ORAL | 3 refills | Status: DC

## 2021-08-17 MED ORDER — APIXABAN 5 MG PO TABS: 5.0000 mg | ORAL_TABLET | Freq: Two times a day (BID) | ORAL | 0 refills | Status: DC

## 2021-08-17 NOTE — Assessment & Plan Note (Signed)
Patient was advised to drink up to 5 beers  a week

## 2021-08-17 NOTE — Assessment & Plan Note (Signed)
-   The patient's GERD is stable on medication.  - Instructed the patient to avoid eating spicy and acidic foods, as well as foods high in fat. - Instructed the patient to avoid eating large meals or meals 2-3 hours prior to sleeping. 

## 2021-08-17 NOTE — Assessment & Plan Note (Signed)
Chronic problem due to osteoarthritis 

## 2021-08-17 NOTE — Assessment & Plan Note (Signed)
Hypercholesterolemia  I advised the patient to follow Mediterranean diet This diet is rich in fruits vegetables and whole grain, and This diet is also rich in fish and lean meat Patient should also eat a handful of almonds or walnuts daily Recent heart study indicated that average follow-up on this kind of diet reduces the cardiovascular mortality by 50 to 70%== 

## 2021-08-17 NOTE — Progress Notes (Signed)
New Patient Office Visit  Subjective:  Patient ID: Lisa Caldwell, female    DOB: 1946-03-04  Age: 75 y.o. MRN: [id number]  CC:  Chief Complaint  Patient presents with   New Patient (Initial Visit)    Patient here today to establish care for cardiology    Palpitations  The current episode started more than 1 month ago. Associated symptoms include anxiety, chest fullness and an irregular heartbeat. Pertinent negatives include no chest pain, diaphoresis, dizziness, malaise/fatigue, nausea, shortness of breath or syncope.  Patient presents for evaluation of atrial fibrillation patient is on beta-blocker and Eliquis, he denies any chest pain, she does not smoke   Past Medical History:  Diagnosis Date   Allergy    Anxiety    Cancer ([place])    colon cancer   Depression    GERD (gastroesophageal reflux disease)    Headache(784.0)    Inflammatory polyps of colon with rectal bleeding ([place]) 2008    rt colectomy for a polyp with carcinoma in situ    Personal history of malignant neoplasm of large intestine      Current Outpatient Medications:    apixaban (ELIQUIS) 5 MG TABS tablet, Take 1 tablet (5 mg total) by mouth 2 (two) times daily., Disp: 180 tablet, Rfl: 3   Cholecalciferol (VITAMIN D3 PO), Take by mouth daily. , Disp: , Rfl:    citalopram (CELEXA) 20 MG tablet, Take 1 tablet (20 mg total) by mouth daily., Disp: 90 tablet, Rfl: 3   fluticasone (FLONASE) 50 MCG/ACT nasal spray, USE 2 SPRAYS IN BOTH  NOSTRILS DAILY, Disp: 48 g, Rfl: 6   metoprolol succinate (TOPROL XL) 25 MG 24 hr tablet, Take 2 tablets (50 mg total) by mouth daily., Disp: 30 tablet, Rfl: 1   Multiple Vitamin (MULTIVITAMIN) tablet, Take 1 tablet by mouth daily., Disp: , Rfl:    omeprazole (PRILOSEC) 20 MG capsule, Take 1 capsule (20 mg total) by mouth daily., Disp: 180 capsule, Rfl: 1   simvastatin (ZOCOR) 10 MG tablet, Take 1 tablet (10 mg total) by mouth at bedtime., Disp: 90 tablet, Rfl: 3   vitamin B-12  (CYANOCOBALAMIN) 1000 MCG tablet, Take 1,000 mcg by mouth daily., Disp: , Rfl:    Past Surgical History:  Procedure Laterality Date   ABDOMINAL HYSTERECTOMY  1996   APPENDECTOMY     COLECTOMY Right 2008   COLONOSCOPY  2012   Dr. Jamal Collin   COLONOSCOPY WITH PROPOFOL N/A 03/10/2016   Procedure: COLONOSCOPY WITH PROPOFOL;  Surgeon: [name], MD;  Location: [facility] ENDOSCOPY;  Service: Endoscopy;  Laterality: N/A;   COLONOSCOPY WITH PROPOFOL N/A 03/09/2021   Procedure: COLONOSCOPY WITH PROPOFOL;  Surgeon: Jonathon Bellows, MD;  Location: [facility] ENDOSCOPY;  Service: Gastroenterology;  Laterality: N/A;   FOOT SURGERY Left 2012   FOOT SURGERY Left 2013   THYROGLOSSAL DUCT CYST N/A 09/04/2015   Procedure: THYROGLOSSAL DUCT CYST;  Surgeon: Carloyn Manner, MD;  Location: [facility] ORS;  Service: ENT;  Laterality: N/A;    Family History  Problem Relation Age of Onset   Heart disease Mother    Heart disease Father    Drug abuse Brother    Breast cancer Neg Hx     Social History   Socioeconomic History   Marital status: Married    Spouse name: Tom   Number of children: 2   Years of education: Not on file   Highest education level: 12th grade  Occupational History   Occupation: Retired  Tobacco Use  Smoking status: Never   Smokeless tobacco: Never  Substance and Sexual Activity   Alcohol use: Yes    Alcohol/week: 14.0 standard drinks    Types: 14 Cans of beer per week    Comment: 2 a day   Drug use: No   Sexual activity: Never    Birth control/protection: Other-see comments    Comment: hysterectomy  Other Topics Concern   Not on file  Social History Narrative   Not on file   Social Determinants of Health   Financial Resource Strain: Not on file  Food Insecurity: Not on file  Transportation Needs: Not on file  Physical Activity: Not on file  Stress: Not on file  Social Connections: Not on file  Intimate Partner Violence: Not on file    ROS Review of Systems   Constitutional:  Negative for diaphoresis, fatigue and malaise/fatigue.  HENT:  Negative for congestion.   Eyes:  Negative for redness.  Respiratory:  Negative for shortness of breath.   Cardiovascular:  Positive for palpitations. Negative for chest pain and syncope.  Gastrointestinal:  Negative for nausea.  Endocrine: Negative for polydipsia.  Neurological:  Negative for dizziness, tremors, speech difficulty and headaches.  Psychiatric/Behavioral:  The patient is nervous/anxious.    Objective:   Today's Vitals: BP 132/78   Pulse 61   Ht 5' 4.5" (1.638 m)   Wt 155 lb 14.4 oz (70.7 kg)   BMI 26.35 kg/m   Physical Exam Constitutional:      General: She is not in acute distress.    Appearance: Normal appearance. She is normal weight.  HENT:     Head: Normocephalic and atraumatic.     Nose: Nose normal.  Cardiovascular:     Rate and Rhythm: Regular rhythm. Bradycardia present.  Pulmonary:     Breath sounds: No rhonchi.  Abdominal:     Palpations: There is no mass.  Musculoskeletal:        General: Normal range of motion.  Neurological:     General: No focal deficit present.     Mental Status: She is alert.    Assessment & Plan:   Problem List Items Addressed This Visit       Cardiovascular and Mediastinum   Paroxysmal atrial fibrillation (HCC)    Converted to normal sinus rhythm      Relevant Medications   apixaban (ELIQUIS) 5 MG TABS tablet     Digestive   Acid reflux - Primary    - The patient's GERD is stable on medication.  - Instructed the patient to avoid eating spicy and acidic foods, as well as foods high in fat. - Instructed the patient to avoid eating large meals or meals 2-3 hours prior to sleeping.        Musculoskeletal and Integument   Primary osteoarthritis of right knee    Chronic problem due to osteoarthritis        Other   Clinical depression    Patient was advised to continue antidepressant      Alcohol abuse    Patient was  advised to drink up to 5 beers  a week      Hypercholesterolemia    Hypercholesterolemia  I advised the patient to follow Mediterranean diet This diet is rich in fruits vegetables and whole grain, and This diet is also rich in fish and lean meat Patient should also eat a handful of almonds or walnuts daily Recent heart study indicated that average follow-up on this kind of diet  reduces the cardiovascular mortality by 50 to 70%==      Relevant Medications   apixaban (ELIQUIS) 5 MG TABS tablet    Outpatient Encounter Medications as of 08/17/2021  Medication Sig   apixaban (ELIQUIS) 5 MG TABS tablet Take 1 tablet (5 mg total) by mouth 2 (two) times daily.   Cholecalciferol (VITAMIN D3 PO) Take by mouth daily.    citalopram (CELEXA) 20 MG tablet Take 1 tablet (20 mg total) by mouth daily.   fluticasone (FLONASE) 50 MCG/ACT nasal spray USE 2 SPRAYS IN BOTH  NOSTRILS DAILY   metoprolol succinate (TOPROL XL) 25 MG 24 hr tablet Take 2 tablets (50 mg total) by mouth daily.   Multiple Vitamin (MULTIVITAMIN) tablet Take 1 tablet by mouth daily.   omeprazole (PRILOSEC) 20 MG capsule Take 1 capsule (20 mg total) by mouth daily.   simvastatin (ZOCOR) 10 MG tablet Take 1 tablet (10 mg total) by mouth at bedtime.   vitamin B-12 (CYANOCOBALAMIN) 1000 MCG tablet Take 1,000 mcg by mouth daily.   [DISCONTINUED] apixaban (ELIQUIS) 5 MG TABS tablet Take 1 tablet (5 mg total) by mouth 2 (two) times daily.   [DISCONTINUED] apixaban (ELIQUIS) 5 MG TABS tablet Take 1 tablet (5 mg total) by mouth 2 (two) times daily.   [DISCONTINUED] aspirin 81 MG tablet Take 81 mg by mouth daily.   No facility-administered encounter medications on file as of 08/17/2021.    Ekg     Sinus bradycardia, patient has converted from atrial fibrillation to sinus rhythm normal  Patient was advised to continue beta-blocker and Eliquis.  We will do an echocardiogram to evaluate the left ventricular function.  Follow-up: No  follow-ups on file.   Cletis Athens, MD

## 2021-08-17 NOTE — Assessment & Plan Note (Signed)
Converted to normal sinus rhythm

## 2021-08-17 NOTE — Assessment & Plan Note (Signed)
Patient was advised to continue antidepressant

## 2021-08-18 ENCOUNTER — Other Ambulatory Visit (INDEPENDENT_AMBULATORY_CARE_PROVIDER_SITE_OTHER): Payer: Medicare Other

## 2021-08-18 DIAGNOSIS — F1010 Alcohol abuse, uncomplicated: Secondary | ICD-10-CM

## 2021-08-18 DIAGNOSIS — J3089 Other allergic rhinitis: Secondary | ICD-10-CM | POA: Diagnosis not present

## 2021-08-18 DIAGNOSIS — I480 Paroxysmal atrial fibrillation: Secondary | ICD-10-CM

## 2021-08-18 NOTE — Progress Notes (Unsigned)
Established Patient Office Visit  Subjective:  Patient ID: Lisa Caldwell, female    DOB: 08-15-1946  Age: 75 y.o. MRN: [id number]  CC: No chief complaint on file.   HPI  [facility] for [facility]ogram.  Patient has known chronic atrial fibrillation so need to determine the left ventricular ejection fraction  Past Medical History:  Diagnosis Date   Allergy    Anxiety    Cancer ([place])    colon cancer   Depression    GERD (gastroesophageal reflux disease)    Headache(784.0)    Inflammatory polyps of colon with rectal bleeding ([place]) 2008    rt colectomy for a polyp with carcinoma in situ    Personal history of malignant neoplasm of large intestine     Past Surgical History:  Procedure Laterality Date   [place] Right 2008   COLONOSCOPY  2012   Dr. Jamal Collin   COLONOSCOPY WITH PROPOFOL N/A 03/10/2016   Procedure: COLONOSCOPY WITH PROPOFOL;  Surgeon: [name], MD;  Location: [facility] ENDOSCOPY;  Service: Endoscopy;  Laterality: N/A;   COLONOSCOPY WITH PROPOFOL N/A 03/09/2021   Procedure: COLONOSCOPY WITH PROPOFOL;  Surgeon: Jonathon Bellows, MD;  [facility]COPY;  Service: Gastroenterology;  Laterality: N/A;   FOOT SURGERY Left 2012   FOOT SURGERY Left 2013   THYROGLOSSAL DUCT CYST N/A 09/04/2015   Procedure: THYROGLOSSAL DUCT CYST;  Surgeon: Carloyn Manner, MD;  Location: [facility] ORS;  Service: ENT;  Laterality: N/A;    Family History  Problem Relation Age of Onset   Heart disease Mother    Heart disease Father    Drug abuse Brother    Breast cancer Neg Hx     Social History   Socioeconomic History   Marital status: Married    Spouse name: Tom   Number of children: 2   Years of education: Not on file   Highest education level: 12th grade  Occupational History   Occupation: Retired  Tobacco Use   Smoking status: Never   Smokeless tobacco: Never  Substance and Sexual Activity   Alcohol  use: Yes    Alcohol/week: 14.0 standard drinks    Types: 14 Cans of beer per week    Comment: 2 a day   Drug use: No   Sexual activity: Never    Birth control/protection: Other-see comments    Comment: hysterectomy  Other Topics Concern   Not on file  Social History Narrative   Not on file   Social Determinants of Health   Financial Resource Strain: Not on file  Food Insecurity: Not on file  Transportation Needs: Not on file  Physical Activity: Not on file  Stress: Not on file  Social Connections: Not on file  Intimate Partner Violence: Not on file     Current Outpatient Medications:    apixaban (ELIQUIS) 5 MG TABS tablet, Take 1 tablet (5 mg total) by mouth 2 (two) times daily., Disp: 180 tablet, Rfl: 3   Cholecalciferol (VITAMIN D3 PO), Take by mouth daily. , Disp: , Rfl:    citalopram (CELEXA) 20 MG tablet, Take 1 tablet (20 mg total) by mouth daily., Disp: 90 tablet, Rfl: 3   fluticasone (FLONASE) 50 MCG/ACT nasal spray, USE 2 SPRAYS IN BOTH  NOSTRILS DAILY, Disp: 48 g, Rfl: 6   metoprolol succinate (TOPROL XL) 25 MG 24 hr tablet, Take 2 tablets (50 mg total) by mouth daily., Disp: 30 tablet, Rfl:  1   Multiple Vitamin (MULTIVITAMIN) tablet, Take 1 tablet by mouth daily., Disp: , Rfl:    omeprazole (PRILOSEC) 20 MG capsule, Take 1 capsule (20 mg total) by mouth daily., Disp: 180 capsule, Rfl: 1   simvastatin (ZOCOR) 10 MG tablet, Take 1 tablet (10 mg total) by mouth at bedtime., Disp: 90 tablet, Rfl: 3   vitamin B-12 (CYANOCOBALAMIN) 1000 MCG tablet, Take 1,000 mcg by mouth daily., Disp: , Rfl:    Allergies  Allergen Reactions   Codeine Nausea And Vomiting   Peanut-Containing Drug Products Diarrhea and Nausea And Vomiting    Pine nut   Phenergan [Promethazine Hcl] Other (See Comments)    tremors    ROS Review of Systems  Constitutional: Negative.   HENT: Negative.    Eyes: Negative.   Respiratory: Negative.    Cardiovascular: Negative.   Gastrointestinal:  Negative.   Endocrine: Negative.   Genitourinary: Negative.   Musculoskeletal: Negative.   Skin: Negative.   Allergic/Immunologic: Negative.   Neurological: Negative.   Hematological: Negative.   Psychiatric/Behavioral: Negative.    All other systems reviewed and are negative.    Objective:    Physical Exam Vitals reviewed.  Constitutional:      Appearance: Normal appearance.  HENT:     Mouth/Throat:     Mouth: Mucous membranes are moist.  Eyes:     Pupils: Pupils are equal, round, and reactive to light.  Neck:     Vascular: No carotid bruit.  Cardiovascular:     Rate and Rhythm: Regular rhythm. Bradycardia present.     Pulses: Normal pulses.     Heart sounds: Normal heart sounds.  Pulmonary:     Effort: Pulmonary effort is normal.     Breath sounds: Normal breath sounds.  Abdominal:     General: Bowel sounds are normal.     Palpations: Abdomen is soft. There is no hepatomegaly, splenomegaly or mass.     Tenderness: There is no abdominal tenderness.     Hernia: No hernia is present.  Musculoskeletal:        General: No tenderness.     Cervical back: Neck supple.     Right lower leg: No edema.     Left lower leg: No edema.  Skin:    General: Skin is warm and dry.     Findings: No rash.  Neurological:     Mental Status: She is alert and oriented to person, place, and time.     Motor: No weakness.  Psychiatric:        Mood and Affect: Mood and affect normal.        Behavior: Behavior normal.    There were no vitals taken for this visit. Wt Readings from Last 3 Encounters:  08/17/21 155 lb 14.4 oz (70.7 kg)  08/07/21 155 lb (70.3 kg)  06/01/21 156 lb 3.2 oz (70.9 kg)     Health Maintenance Due  Topic Date Due   COVID-19 Vaccine (4 - Booster for Pfizer series) 03/27/2021    There are no preventive care reminders to display for this patient.  Lab Results  Component Value Date   TSH 2.020 06/12/2021   Lab Results  Component Value Date   WBC 8.0  08/07/2021   HGB 14.5 08/07/2021   HCT 41.3 08/07/2021   MCV 98.6 08/07/2021   PLT 246 08/07/2021   Lab Results  Component Value Date   NA 141 08/07/2021   K 3.8 08/07/2021   CO2 25 08/07/2021  GLUCOSE 104 (H) 08/07/2021   BUN 18 08/07/2021   CREATININE 0.85 08/07/2021   BILITOT 0.6 06/12/2021   ALKPHOS 90 06/12/2021   AST 27 06/12/2021   ALT 18 06/12/2021   PROT 6.9 06/12/2021   ALBUMIN 4.3 06/12/2021   CALCIUM 9.4 08/07/2021   ANIONGAP 8 08/07/2021   EGFR 76 06/12/2021   Lab Results  Component Value Date   CHOL 201 (H) 06/12/2021   Lab Results  Component Value Date   HDL 77 06/12/2021   Lab Results  Component Value Date   LDLCALC 110 (H) 06/12/2021   Lab Results  Component Value Date   TRIG 78 06/12/2021   Lab Results  Component Value Date   CHOLHDL 2.6 06/12/2021   Lab Results  Component Value Date   HGBA1C 5.4 06/12/2021      Assessment & Plan:   Problem List Items Addressed This Visit       Cardiovascular and Mediastinum   Paroxysmal atrial fibrillation ([place]) - Primary    Atrial Fibrillation  Atrial fibrillation is a type of heartbeat that is irregular or fast. If you have this condition, your heart beats without any order. This makes it hard for your heart to pump blood in a normal way. Atrial fibrillation may come and go, or it may become a long-lasting problem. If this condition is not treated, it can put you at higher risk for stroke, heart failure, and other heart problems. What are the causes? This condition may be caused by diseases that damage the heart. They include:  High blood pressure.  Heart failure.  Heart valve disease.  Heart surgery. Other causes include:  Diabetes.  Thyroid disease.  Being overweight.  Kidney disease. Sometimes the cause is not known.        Respiratory   Allergic rhinitis    Stable at the present time        Other   Alcohol abuse    Patient was advised to stop drinking excessive beer      [facility], [place] [address] Phone: [phone] Fax:  [phone]  Transthoracic Echocardiogram Note  Lisa Caldwell [id number] 1946/03/18  Procedure: Transthoracic Echocardiogram Indications: Atrial fibrillation converted to normal sinus rhythm Verbal Consent: Obtained  Procedure Details Echo technically difficult because of the poor acoustic windows left ventricular function appears normal.  Right ventricular windows were obtained from the subcostal view and right ventricular function is normal.  Estimated left ventricular ejection fraction is between 45 to 50%   Technical quality: good  Resting Measurements: Unable to obtain  Left Ventrical: Left ventricular ejection fraction is 45%  Mitral Valve: Normal  Aortic Valve: Normal  Tricuspid Valve: Not seen  Pulmonic Valve: Not seen  Left Atrium/ Left atrial appendage: Not seen clearly  Atrial septum:   Aorta: Normal   Complications: No apparent complications Patient did tolerate procedure well.  Cletis Athens, MD   No orders of the defined types were placed in this encounter.   Follow-up: No follow-ups on file.    Cletis Athens, MD

## 2021-08-18 NOTE — Assessment & Plan Note (Signed)
Stable at the present time. 

## 2021-08-18 NOTE — Assessment & Plan Note (Signed)
Atrial Fibrillation  Atrial fibrillation is a type of heartbeat that is irregular or fast. If you have this condition, your heart beats without any order. This makes it hard for your heart to pump blood in a normal way. Atrial fibrillation may come and go, or it may become a long-lasting problem. If this condition is not treated, it can put you at higher risk for stroke, heart failure, and other heart problems. What are the causes? This condition may be caused by diseases that damage the heart. They include:  High blood pressure.  Heart failure.  Heart valve disease.  Heart surgery. Other causes include:  Diabetes.  Thyroid disease.  Being overweight.  Kidney disease. Sometimes the cause is not known.

## 2021-08-18 NOTE — Assessment & Plan Note (Signed)
Patient was advised to stop drinking excessive beer

## 2021-08-31 ENCOUNTER — Other Ambulatory Visit: Payer: Self-pay | Admitting: *Deleted

## 2021-08-31 DIAGNOSIS — I480 Paroxysmal atrial fibrillation: Secondary | ICD-10-CM

## 2021-08-31 MED ORDER — METOPROLOL SUCCINATE ER 25 MG PO TB24: 50.0000 mg | ORAL_TABLET | Freq: Every day | ORAL | 1 refills | Status: DC

## 2021-08-31 MED ORDER — APIXABAN 5 MG PO TABS: 5.0000 mg | ORAL_TABLET | Freq: Two times a day (BID) | ORAL | 0 refills | Status: DC

## 2021-08-31 MED ORDER — APIXABAN 5 MG PO TABS: 5.0000 mg | ORAL_TABLET | Freq: Two times a day (BID) | ORAL | 1 refills | Status: DC

## 2021-08-31 MED ORDER — METOPROLOL SUCCINATE ER 25 MG PO TB24: 50.0000 mg | ORAL_TABLET | Freq: Every day | ORAL | 0 refills | Status: DC

## 2021-09-08 ENCOUNTER — Other Ambulatory Visit: Payer: Self-pay | Admitting: Internal Medicine

## 2021-09-21 ENCOUNTER — Ambulatory Visit (INDEPENDENT_AMBULATORY_CARE_PROVIDER_SITE_OTHER): Payer: Medicare Other | Admitting: Internal Medicine

## 2021-09-21 ENCOUNTER — Encounter: Payer: Self-pay | Admitting: Internal Medicine

## 2021-09-21 ENCOUNTER — Other Ambulatory Visit: Payer: Self-pay

## 2021-09-21 VITALS — BP 153/74 | HR 65 | Ht 64.5 in | Wt 158.4 lb

## 2021-09-21 DIAGNOSIS — I480 Paroxysmal atrial fibrillation: Secondary | ICD-10-CM | POA: Diagnosis not present

## 2021-09-21 DIAGNOSIS — J309 Allergic rhinitis, unspecified: Secondary | ICD-10-CM | POA: Diagnosis not present

## 2021-09-21 DIAGNOSIS — F3342 Major depressive disorder, recurrent, in full remission: Secondary | ICD-10-CM | POA: Diagnosis not present

## 2021-09-21 DIAGNOSIS — K219 Gastro-esophageal reflux disease without esophagitis: Secondary | ICD-10-CM | POA: Diagnosis not present

## 2021-09-21 DIAGNOSIS — E7800 Pure hypercholesterolemia, unspecified: Secondary | ICD-10-CM

## 2021-09-21 DIAGNOSIS — F1010 Alcohol abuse, uncomplicated: Secondary | ICD-10-CM

## 2021-09-21 NOTE — Assessment & Plan Note (Signed)
Hypercholesterolemia  I advised the patient to follow Mediterranean diet This diet is rich in fruits vegetables and whole grain, and This diet is also rich in fish and lean meat Patient should also eat a handful of almonds or walnuts daily Recent heart study indicated that average follow-up on this kind of diet reduces the cardiovascular mortality by 50 to 70%== 

## 2021-09-21 NOTE — Assessment & Plan Note (Signed)
I advised the patient to stop drinking alcohol.

## 2021-09-21 NOTE — Assessment & Plan Note (Signed)
-   The patient's GERD is stable on medication.  - Instructed the patient to avoid eating spicy and acidic foods, as well as foods high in fat. - Instructed the patient to avoid eating large meals or meals 2-3 hours prior to sleeping. 

## 2021-09-21 NOTE — Assessment & Plan Note (Signed)
Take Claritin 5 mg daily

## 2021-09-21 NOTE — Progress Notes (Signed)
Established Patient Office Visit  Subjective:  Patient ID: Lisa Caldwell, female    DOB: 1946-07-30  Age: 75 y.o. MRN: [id number]  CC:  Chief Complaint  Patient presents with   Follow-up    Patient is here for 1 month cardiology follow up    Palpitations  This is a recurrent problem. The problem has been gradually improving. On average, each episode lasts 3 hours.   Lisa Caldwell presents for check up  Past Medical History:  Diagnosis Date   Allergy    Anxiety    Cancer ([place])    colon cancer   Depression    GERD (gastroesophageal reflux disease)    Headache(784.0)    Inflammatory polyps of colon with rectal bleeding ([place]) 2008    rt colectomy for a polyp with carcinoma in situ    Personal history of malignant neoplasm of large intestine     Past Surgical History:  Procedure Laterality Date   [place] Right 2008   COLONOSCOPY  2012   Dr. Jamal Collin   COLONOSCOPY WITH PROPOFOL N/A 03/10/2016   Procedure: COLONOSCOPY WITH PROPOFOL;  Surgeon: [name], MD;  Location: [facility] ENDOSCOPY;  Service: Endoscopy;  Laterality: N/A;   COLONOSCOPY WITH PROPOFOL N/A 03/09/2021   Procedure: COLONOSCOPY WITH PROPOFOL;  Surgeon: Jonathon Bellows, MD;  Location: [facility] ENDOSCOPY;  Service: Gastroenterology;  Laterality: N/A;   FOOT SURGERY Left 2012   FOOT SURGERY Left 2013   THYROGLOSSAL DUCT CYST N/A 09/04/2015   Procedure: THYROGLOSSAL DUCT CYST;  Surgeon: Carloyn Manner, MD;  Location: [facility] ORS;  Service: ENT;  Laterality: N/A;    Family History  Problem Relation Age of Onset   Heart disease Mother    Heart disease Father    Drug abuse Brother    Breast cancer Neg Hx     Social History   Socioeconomic History   Marital status: Married    Spouse name: Tom   Number of children: 2   Years of education: Not on file   Highest education level: 12th grade  Occupational History   Occupation: Retired  Tobacco Use    Smoking status: Never   Smokeless tobacco: Never  Substance and Sexual Activity   Alcohol use: Yes    Alcohol/week: 14.0 standard drinks    Types: 14 Cans of beer per week    Comment: 2 a day   Drug use: No   Sexual activity: Never    Birth control/protection: Other-see comments    Comment: hysterectomy  Other Topics Concern   Not on file  Social History Narrative   Not on file   Social Determinants of Health   Financial Resource Strain: Not on file  Food Insecurity: Not on file  Transportation Needs: Not on file  Physical Activity: Not on file  Stress: Not on file  Social Connections: Not on file  Intimate Partner Violence: Not on file     Current Outpatient Medications:    apixaban (ELIQUIS) 5 MG TABS tablet, Take 1 tablet (5 mg total) by mouth 2 (two) times daily., Disp: 180 tablet, Rfl: 1   Cholecalciferol (VITAMIN D3 PO), Take by mouth daily. , Disp: , Rfl:    citalopram (CELEXA) 20 MG tablet, Take 1 tablet (20 mg total) by mouth daily., Disp: 90 tablet, Rfl: 3   fluticasone (FLONASE) 50 MCG/ACT nasal spray, USE 2 SPRAYS IN BOTH  NOSTRILS DAILY, Disp: 48 g, Rfl: 6  metoprolol succinate (TOPROL-XL) 25 MG 24 hr tablet, TAKE 2 TABLETS BY MOUTH EVERY DAY, Disp: 30 tablet, Rfl: 6   Multiple Vitamin (MULTIVITAMIN) tablet, Take 1 tablet by mouth daily., Disp: , Rfl:    omeprazole (PRILOSEC) 20 MG capsule, Take 1 capsule (20 mg total) by mouth daily., Disp: 180 capsule, Rfl: 1   simvastatin (ZOCOR) 10 MG tablet, Take 1 tablet (10 mg total) by mouth at bedtime., Disp: 90 tablet, Rfl: 3   vitamin B-12 (CYANOCOBALAMIN) 1000 MCG tablet, Take 1,000 mcg by mouth daily., Disp: , Rfl:    Allergies  Allergen Reactions   Codeine Nausea And Vomiting   Peanut-Containing Drug Products Diarrhea and Nausea And Vomiting    Pine nut   Phenergan [Promethazine Hcl] Other (See Comments)    tremors    ROS Review of Systems  Constitutional: Negative.   HENT: Negative.    Eyes: Negative.    Respiratory: Negative.    Cardiovascular:  Positive for palpitations.  Gastrointestinal: Negative.   Endocrine: Negative.   Genitourinary: Negative.   Musculoskeletal: Negative.   Skin: Negative.   Allergic/Immunologic: Negative.   Neurological: Negative.   Hematological: Negative.   Psychiatric/Behavioral: Negative.    All other systems reviewed and are negative.    Objective:    Physical Exam Vitals reviewed.  Constitutional:      Appearance: Normal appearance.  HENT:     Mouth/Throat:     Mouth: Mucous membranes are moist.  Eyes:     Pupils: Pupils are equal, round, and reactive to light.  Neck:     Vascular: No carotid bruit.  Cardiovascular:     Rate and Rhythm: Normal rate and regular rhythm.     Pulses: Normal pulses.     Heart sounds: Normal heart sounds.  Pulmonary:     Effort: Pulmonary effort is normal.     Breath sounds: Normal breath sounds.  Abdominal:     General: Bowel sounds are normal.     Palpations: Abdomen is soft. There is no hepatomegaly, splenomegaly or mass.     Tenderness: There is no abdominal tenderness.     Hernia: No hernia is present.  Musculoskeletal:        General: No tenderness.     Cervical back: Neck supple.     Right lower leg: No edema.     Left lower leg: No edema.  Skin:    Findings: No rash.  Neurological:     Mental Status: She is alert and oriented to person, place, and time.     Motor: No weakness.  Psychiatric:        Mood and Affect: Mood and affect normal.        Behavior: Behavior normal.    BP (!) 153/74   Pulse 65   Ht 5' 4.5" (1.638 m)   Wt 158 lb 6.4 oz (71.8 kg)   BMI 26.77 kg/m  Wt Readings from Last 3 Encounters:  09/21/21 158 lb 6.4 oz (71.8 kg)  08/17/21 155 lb 14.4 oz (70.7 kg)  08/07/21 155 lb (70.3 kg)     Health Maintenance Due  Topic Date Due   COVID-19 Vaccine (4 - Booster for Pfizer series) 03/27/2021    There are no preventive care reminders to display for this patient.  Lab  Results  Component Value Date   TSH 2.020 06/12/2021   Lab Results  Component Value Date   WBC 8.0 08/07/2021   HGB 14.5 08/07/2021   HCT 41.3 08/07/2021  MCV 98.6 08/07/2021   PLT 246 08/07/2021   Lab Results  Component Value Date   NA 141 08/07/2021   K 3.8 08/07/2021   CO2 25 08/07/2021   GLUCOSE 104 (H) 08/07/2021   BUN 18 08/07/2021   CREATININE 0.85 08/07/2021   BILITOT 0.6 06/12/2021   ALKPHOS 90 06/12/2021   AST 27 06/12/2021   ALT 18 06/12/2021   PROT 6.9 06/12/2021   ALBUMIN 4.3 06/12/2021   CALCIUM 9.4 08/07/2021   ANIONGAP 8 08/07/2021   EGFR 76 06/12/2021   Lab Results  Component Value Date   CHOL 201 (H) 06/12/2021   Lab Results  Component Value Date   HDL 77 06/12/2021   Lab Results  Component Value Date   LDLCALC 110 (H) 06/12/2021   Lab Results  Component Value Date   TRIG 78 06/12/2021   Lab Results  Component Value Date   CHOLHDL 2.6 06/12/2021   Lab Results  Component Value Date   HGBA1C 5.4 06/12/2021      Assessment & Plan:   Problem List Items Addressed This Visit       Cardiovascular and Mediastinum   Paroxysmal atrial fibrillation (HCC) - Primary    Stable on metoprolol        Respiratory   Chronic allergic rhinitis    Take Claritin 5 mg daily        Digestive   Acid reflux    - The patient's GERD is stable on medication.  - Instructed the patient to avoid eating spicy and acidic foods, as well as foods high in fat. - Instructed the patient to avoid eating large meals or meals 2-3 hours prior to sleeping.        Other   Alcohol abuse    I advised the patient to stop drinking alcohol.      Recurrent major depressive disorder, in full remission ([place])    Continue antidepressant therapy      Hypercholesterolemia    Hypercholesterolemia  I advised the patient to follow Mediterranean diet This diet is rich in fruits vegetables and whole grain, and This diet is also rich in fish and lean meat Patient  should also eat a handful of almonds or walnuts daily Recent heart study indicated that average follow-up on this kind of diet reduces the cardiovascular mortality by 50 to 70%==       No orders of the defined types were placed in this encounter.   Follow-up: No follow-ups on file.    Cletis Athens, MD

## 2021-09-21 NOTE — Assessment & Plan Note (Signed)
Continue antidepressant therapy

## 2021-09-21 NOTE — Assessment & Plan Note (Signed)
Stable on metoprolol. °

## 2021-10-23 ENCOUNTER — Ambulatory Visit (INDEPENDENT_AMBULATORY_CARE_PROVIDER_SITE_OTHER): Payer: Commercial Managed Care - HMO | Admitting: *Deleted

## 2021-10-23 DIAGNOSIS — Z0000 Encounter for general adult medical examination without abnormal findings: Secondary | ICD-10-CM | POA: Diagnosis not present

## 2021-10-23 NOTE — Progress Notes (Signed)
I have reviewed this visit and agree with the documentation.   

## 2021-10-23 NOTE — Progress Notes (Signed)
Subjective:   Lisa Caldwell is a 76 y.o. female who presents for Medicare Annual (Subsequent) preventive examination.  I discussed the limitations of evaluation and management by telemedicine and the availability of in person appointments. Patient expressed understanding and agreed to proceed.   Visit performed using audio  Patient:home Provider:home   Review of Systems    Defer to  provider  Cardiac Risk Factors include: advanced age (>54men, >93 women)     Objective:    There were no vitals filed for this visit. There is no height or weight on file to calculate BMI.  Advanced Directives 10/23/2021 08/07/2021 03/09/2021 05/26/2020 05/22/2019 05/17/2018 05/12/2017  Does Patient Have a Medical Advance Directive? No Yes Yes Yes Yes Yes No  Type of Advance Directive - [place];Living will Living will [place];Living will [place];Living will [place];Living will -  Copy of [place] in Chart? - - - No - copy requested No - copy requested No - copy requested -  Would patient like information on creating a medical advance directive? No - Patient declined - - - - - No - Patient declined    Current Medications (verified) Outpatient Encounter Medications as of 10/23/2021  Medication Sig   Cholecalciferol (VITAMIN D3 PO) Take by mouth daily.    citalopram (CELEXA) 20 MG tablet Take 1 tablet (20 mg total) by mouth daily.   fluticasone (FLONASE) 50 MCG/ACT nasal spray USE 2 SPRAYS IN BOTH  NOSTRILS DAILY   metoprolol succinate (TOPROL-XL) 25 MG 24 hr tablet TAKE 2 TABLETS BY MOUTH EVERY DAY   Multiple Vitamin (MULTIVITAMIN) tablet Take 1 tablet by mouth daily.   omeprazole (PRILOSEC) 20 MG capsule Take 1 capsule (20 mg total) by mouth daily.   simvastatin (ZOCOR) 10 MG tablet Take 1 tablet (10 mg total) by mouth at bedtime.   vitamin B-12 (CYANOCOBALAMIN) 1000 MCG tablet Take 1,000 mcg by mouth  daily.   apixaban (ELIQUIS) 5 MG TABS tablet Take 1 tablet (5 mg total) by mouth 2 (two) times daily.   No facility-administered encounter medications on file as of 10/23/2021.    Allergies (verified) Codeine, Peanut-containing drug products, and Phenergan [promethazine hcl]   History: Past Medical History:  Diagnosis Date   Allergy    Anxiety    Cancer ([place])    colon cancer   Depression    GERD (gastroesophageal reflux disease)    Headache(784.0)    Inflammatory polyps of colon with rectal bleeding ([place]) 2008    rt colectomy for a polyp with carcinoma in situ    Personal history of malignant neoplasm of large intestine    Past Surgical History:  Procedure Laterality Date   [place] Right 2008   COLONOSCOPY  2012   Dr. Jamal Collin   COLONOSCOPY WITH PROPOFOL N/A 03/10/2016   Procedure: COLONOSCOPY WITH PROPOFOL;  Surgeon: [name], MD;  Location: [facility] ENDOSCOPY;  Service: Endoscopy;  Laterality: N/A;   COLONOSCOPY WITH PROPOFOL N/A 03/09/2021   Procedure: COLONOSCOPY WITH PROPOFOL;  Surgeon: Jonathon Bellows, MD;  Location: [facility] ENDOSCOPY;  Service: Gastroenterology;  Laterality: N/A;   FOOT SURGERY Left 2012   FOOT SURGERY Left 2013   THYROGLOSSAL DUCT CYST N/A 09/04/2015   Procedure: THYROGLOSSAL DUCT CYST;  Surgeon: Carloyn Manner, MD;  Location: [facility] ORS;  Service: ENT;  Laterality: N/A;   Family History  Problem Relation Age  of Onset   Heart disease Mother    Heart disease Father    Drug abuse Brother    Breast cancer Neg Hx    Social History   Socioeconomic History   Marital status: Married    Spouse name: Tom   Number of children: 2   Years of education: Not on file   Highest education level: 12th grade  Occupational History   Occupation: Retired  Tobacco Use   Smoking status: Never   Smokeless tobacco: Never  Substance and Sexual Activity   Alcohol use: Yes    Alcohol/week: 14.0 standard drinks     Types: 14 Cans of beer per week    Comment: 2 a day   Drug use: No   Sexual activity: Never    Birth control/protection: Other-see comments    Comment: hysterectomy  Other Topics Concern   Not on file  Social History Narrative   Not on file   Social Determinants of Health   Financial Resource Strain: Low Risk    Difficulty of Paying Living Expenses: Not hard at all  Food Insecurity: No Food Insecurity   Worried About Charity fundraiser in the Last Year: Never true   [place] in the Last Year: Never true  Transportation Needs: No Transportation Needs   Lack of Transportation (Medical): No   Lack of Transportation (Non-Medical): No  Physical Activity: Sufficiently Active   Days of Exercise per Week: 5 days   Minutes of Exercise per Session: 40 min  Stress: No Stress Concern Present   Feeling of Stress : Not at all  Social Connections: Moderately Isolated   Frequency of Communication with Friends and Family: More than three times a week   Frequency of Social Gatherings with Friends and Family: More than three times a week   Attends Religious Services: Never   Marine scientist or Organizations: No   Attends Music therapist: Never   Marital Status: Married    Tobacco Counseling Counseling given: Not Answered   Clinical Intake:  Pre-visit preparation completed: Yes  Pain : No/denies pain     Nutritional Risks: None Diabetes: No  How often do you need to have someone help you when you read instructions, pamphlets, or other written materials from your doctor or pharmacy?: 1 - Never  Diabetic?no  Interpreter Needed?: No  Information entered by :: Anayi Bricco, cma   Activities of Daily Living In your present state of health, do you have any difficulty performing the following activities: 10/23/2021 10/23/2021  Hearing? - N  Vision? - N  Difficulty concentrating or making decisions? - N  Walking or climbing stairs? - N  Dressing or  bathing? - N  Doing errands, shopping? - N  Conservation officer, nature and eating ? N N  Using the Toilet? N N  In the past six months, have you accidently leaked urine? - N  Do you have problems with loss of bowel control? - N  Managing your Medications? - N  Managing your Finances? - N  Housekeeping or managing your Housekeeping? - N  Some recent data might be hidden    Patient Care Team: Gwyneth Sprout, FNP as PCP - General (Family Medicine)  Indicate any recent Medical Services you may have received from other than [facility] providers in the past year (date may be approximate).     Assessment:   This is a routine wellness examination for [place].  Hearing/Vision screen No results found.  Dietary issues and exercise activities discussed: Current Exercise Habits: Home exercise routine, Type of exercise: walking, Time (Minutes): 30, Frequency (Times/Week): 5, Weekly Exercise (Minutes/Week): 150, Intensity: Mild, Exercise limited by: None identified   Goals Addressed   None    Depression Screen PHQ 2/9 Scores 10/23/2021 10/23/2021 06/01/2021 11/15/2019 05/22/2019 05/17/2018 05/16/2017  PHQ - 2 Score 0 0 0 1 0 1 0  PHQ- 9 Score - - 1 2 - - 0    Fall Risk Fall Risk  10/23/2021 10/21/2021 06/01/2021 05/26/2020 11/15/2019  Falls in the past year? 0 0 0 0 0  Number falls in past yr: 0 0 0 0 0  Injury with Fall? 0 0 0 0 0  Risk for fall due to : No Fall Risks - - - -  Follow up Falls evaluation completed - - Falls prevention discussed Falls evaluation completed    FALL RISK PREVENTION PERTAINING TO THE HOME:  Any stairs in or around the home? No  If so, are there any without handrails? No  Home free of loose throw rugs in walkways, pet beds, electrical cords, etc? yes Adequate lighting in your home to reduce risk of falls? Yes   ASSISTIVE DEVICES UTILIZED TO PREVENT FALLS:  Life alert? No  Use of a cane, walker or w/c? No  Grab bars in the bathroom? No  Shower chair or bench in shower? No  Elevated  toilet seat or a handicapped toilet? No   TIMED UP AND GO:  Was the test performed? No .  Length of time to ambulate 10 feet: - na    Cognitive Function: MMSE - Mini Mental State Exam 10/23/2021  Not completed: Unable to complete     6CIT Screen 10/23/2021 05/26/2020 05/12/2017  What Year? 0 points 0 points 0 points  What month? 0 points 0 points 0 points  What time? 0 points 0 points 0 points  Count back from 20 0 points 0 points 0 points  Months in reverse 0 points 0 points 0 points  Repeat phrase 0 points 0 points 0 points  Total Score 0 0 0    Immunizations Immunization History  Administered Date(s) Administered   H1N1 09/25/2008   Influenza Split 07/16/2010, 07/28/2011, 08/08/2012   Influenza, High Dose Seasonal PF 07/16/2014, 07/19/2015, 07/09/2016, 07/21/2017, 07/19/2018, 07/03/2020   Influenza,inj,Quad PF,6+ Mos 07/12/2013   Influenza-Unspecified 07/13/2021   PFIZER Comirnaty(Gray Top)Covid-19 Tri-Sucrose Vaccine 01/30/2021   PFIZER(Purple Top)SARS-COV-2 Vaccination 12/08/2019, 01/01/2020, 07/17/2020, 07/13/2021   Pneumococcal Conjugate-13 05/05/2015   Pneumococcal Polysaccharide-23 08/27/2011   Tdap 01/11/2006   Zoster Recombinat (Shingrix) 05/16/2017, 07/21/2017   Zoster, Live 08/17/2011    TDAP status: Up to date  Flu Vaccine status: Up to date  Pneumococcal vaccine status: Up to date  Covid-19 vaccine status: Completed vaccines  Qualifies for Shingles Vaccine? Yes   Zostavax completed Yes   Shingrix Completed?: Yes  Screening Tests Health Maintenance  Topic Date Due   TETANUS/TDAP  10/18/2026 (Originally 01/12/2016)   MAMMOGRAM  04/06/2022   COLONOSCOPY (Pts 45-32yrs Insurance coverage will need to be confirmed)  03/09/2026   DEXA SCAN  06/25/2026   Pneumonia Vaccine 71+ Years old  Completed   INFLUENZA VACCINE  Completed   COVID-19 Vaccine  Completed   Hepatitis C Screening  Completed   Zoster Vaccines- Shingrix  Completed   HPV VACCINES  Aged Out     Health Maintenance  There are no preventive care reminders to display for this patient.  Colorectal cancer screening: Type  of screening: Colonoscopy. Completed 03/09/2021. Repeat every 10 years  Mammogram status: Completed 04/06/2021. Repeat every year  Bone Density status: Completed 06/25/2021. Results reflect: Bone density results: NORMAL. Repeat every 3 years.  Lung Cancer Screening: (Low Dose CT Chest recommended if Age 45-80 years, 30 pack-year currently smoking OR have quit w/in 15years.) does not qualify.   Lung Cancer Screening Referral: na  Additional Screening:  Hepatitis C Screening: does not qualify; Completed 05/22/2019  Vision Screening: Recommended annual ophthalmology exams for early detection of glaucoma and other disorders of the eye. Is the patient up to date with their annual eye exam?  Yes  Who is the provider or what is the name of the office in which the patient attends annual eye exams?  If pt is not established with a provider, would they like to be referred to a provider to establish care? No .   Dental Screening: Recommended annual dental exams for proper oral hygiene  Community Resource Referral / Chronic Care Management: CRR required this visit?  No   CCM required this visit?  No      Plan:     I have personally reviewed and noted the following in the patients chart:   Medical and social history Use of alcohol, tobacco or illicit drugs  Current medications and supplements including opioid prescriptions.  Functional ability and status Nutritional status Physical activity Advanced directives List of other physicians Hospitalizations, surgeries, and ER visits in previous 12 months Vitals Screenings to include cognitive, depression, and falls Referrals and appointments  In addition, I have reviewed and discussed with patient certain preventive protocols, quality metrics, and best practice recommendations. A written personalized care  plan for preventive services as well as general preventive health recommendations were provided to patient.     Lacretia Nicks, [place]   10/23/2021   Nurse Notes:  Ms. Arline , Thank you for taking time to come for your Medicare Wellness Visit. I appreciate your ongoing commitment to your health goals. Please review the following plan we discussed and let me know if I can assist you in the future.   These are the goals we discussed:  Goals      Prevent falls     Recommend to remove any items from the home that may cause slips or trips.     Reduce alcohol intake to 1 serving a day.         This is a list of the screening recommended for you and due dates:  Health Maintenance  Topic Date Due   Tetanus Vaccine  10/18/2026*   Mammogram  04/06/2022   Colon Cancer Screening  03/09/2026   DEXA scan (bone density measurement)  06/25/2026   Pneumonia Vaccine  Completed   Flu Shot  Completed   COVID-19 Vaccine  Completed   Hepatitis C Screening: USPSTF Recommendation to screen - Ages 61-79 yo.  Completed   Zoster (Shingles) Vaccine  Completed   HPV Vaccine  Aged Out  *Topic was postponed. The date shown is not the original due date.     Time spent 30 min

## 2021-11-16 ENCOUNTER — Ambulatory Visit (INDEPENDENT_AMBULATORY_CARE_PROVIDER_SITE_OTHER): Payer: Medicare Other | Admitting: Internal Medicine

## 2021-11-16 ENCOUNTER — Other Ambulatory Visit: Payer: Self-pay

## 2021-11-16 ENCOUNTER — Encounter: Payer: Self-pay | Admitting: Internal Medicine

## 2021-11-16 VITALS — BP 127/59 | HR 69 | Ht 64.5 in | Wt 154.6 lb

## 2021-11-16 DIAGNOSIS — M1711 Unilateral primary osteoarthritis, right knee: Secondary | ICD-10-CM | POA: Diagnosis not present

## 2021-11-16 DIAGNOSIS — I480 Paroxysmal atrial fibrillation: Secondary | ICD-10-CM | POA: Diagnosis not present

## 2021-11-16 DIAGNOSIS — E7800 Pure hypercholesterolemia, unspecified: Secondary | ICD-10-CM | POA: Diagnosis not present

## 2021-11-16 DIAGNOSIS — J3089 Other allergic rhinitis: Secondary | ICD-10-CM

## 2021-11-16 DIAGNOSIS — F3342 Major depressive disorder, recurrent, in full remission: Secondary | ICD-10-CM

## 2021-11-16 DIAGNOSIS — K219 Gastro-esophageal reflux disease without esophagitis: Secondary | ICD-10-CM | POA: Diagnosis not present

## 2021-11-16 NOTE — Assessment & Plan Note (Signed)
-   Patient experiencing high levels of anxiety.  - Encouraged patient to engage in relaxing activities like yoga, meditation, journaling, going for a walk, or participating in a hobby.  - Encouraged patient to reach out to trusted friends or family members about recent struggles, Patient was advised to read A book, how to stop worrying and start living, it is good book to read to control  the stress  

## 2021-11-16 NOTE — Assessment & Plan Note (Signed)
Stable at the present time. 

## 2021-11-16 NOTE — Assessment & Plan Note (Signed)
Atrial fibrillation is stable at the present time 

## 2021-11-16 NOTE — Assessment & Plan Note (Signed)
Hypercholesterolemia  I advised the patient to follow Mediterranean diet This diet is rich in fruits vegetables and whole grain, and This diet is also rich in fish and lean meat Patient should also eat a handful of almonds or walnuts daily Recent heart study indicated that average follow-up on this kind of diet reduces the cardiovascular mortality by 50 to 70%== 

## 2021-11-16 NOTE — Progress Notes (Signed)
Established Patient Office Visit  Subjective:  Patient ID: Lisa Caldwell, female    DOB: [birth year]  Age: 76 y.o. MRN: [id number]  CC:  Chief Complaint  Patient presents with   Follow-up    HPI  Lisa Caldwell [facility] presents for check up  Past Medical History:  Diagnosis Date   Allergy    Anxiety    Cancer [facility])    colon cancer   Depression    GERD (gastroesophageal reflux disease)    Headache(784.0)    Inflammatory polyps of colon with rectal bleeding ([place]) 2008    rt colectomy for a polyp with carcinoma in situ    Personal history of malignant neoplasm of large intestine     Past Surgical History:  Procedure Laterality Date   [place] Right 2008   COLONOSCOPY  2012   Dr. Jamal Collin   COLONOSCOPY WITH PROPOFOL N/A 03/10/2016   Procedure: COLONOSCOPY WITH PROPOFOL;  Surgeon: [name], MD;  Location: [facility] ENDOSCOPY;  Service: Endoscopy;  Laterality: N/A;   COLONOSCOPY WITH PROPOFOL N/A 03/09/2021   Procedure: COLONOSCOPY WITH PROPOFOL;  Surgeon: Jonathon Bellows, MD;  Location: [facility] ENDOSCOPY;  Service: Gastroenterology;  Laterality: N/A;   FOOT SURGERY Left 2012   FOOT SURGERY Left 2013   THYROGLOSSAL DUCT CYST N/A 09/04/2015   Procedure: THYROGLOSSAL DUCT CYST;  Surgeon: Carloyn Manner, MD;  Location: [facility] ORS;  Service: ENT;  Laterality: N/A;    Family History  Problem Relation Age of Onset   Heart disease Mother    Heart disease Father    Drug abuse Brother    Breast cancer Neg Hx     Social History   Socioeconomic History   Marital status: Married    Spouse name: Tom   Number of children: 2   Years of education: Not on file   Highest education level: 12th grade  Occupational History   Occupation: Retired  Tobacco Use   Smoking status: Never   Smokeless tobacco: Never  Substance and Sexual Activity   Alcohol use: Yes    Alcohol/week: 14.0 standard drinks    Types: 14 Cans of beer per week     Comment: 2 a day   Drug use: No   Sexual activity: Never    Birth control/protection: Other-see comments    Comment: hysterectomy  Other Topics Concern   Not on file  Social History Narrative   Not on file   Social Determinants of Health   Financial Resource Strain: Low Risk    Difficulty of Paying Living Expenses: Not hard at all  Food Insecurity: No Food Insecurity   Worried About Charity fundraiser in the Last Year: Never true   [place] in the Last Year: Never true  Transportation Needs: No Transportation Needs   Lack of Transportation (Medical): No   Lack of Transportation (Non-Medical): No  Physical Activity: Sufficiently Active   Days of Exercise per Week: 5 days   Minutes of Exercise per Session: 40 min  Stress: No Stress Concern Present   Feeling of Stress : Not at all  Social Connections: Moderately Isolated   Frequency of Communication with Friends and Family: More than three times a week   Frequency of Social Gatherings with Friends and Family: More than three times a week   Attends Religious Services: Never   Marine scientist or Organizations: No   Attends Archivist  Meetings: Never   Marital Status: Married  Human resources officer Violence: Not At Risk   Fear of Current or Ex-Partner: No   Emotionally Abused: No   Physically Abused: No   Sexually Abused: No     Current Outpatient Medications:    apixaban (ELIQUIS) 5 MG TABS tablet, Take 1 tablet (5 mg total) by mouth 2 (two) times daily., Disp: 180 tablet, Rfl: 1   Cholecalciferol (VITAMIN D3 PO), Take by mouth daily. , Disp: , Rfl:    citalopram (CELEXA) 20 MG tablet, Take 1 tablet (20 mg total) by mouth daily., Disp: 90 tablet, Rfl: 3   fluticasone (FLONASE) 50 MCG/ACT nasal spray, USE 2 SPRAYS IN BOTH  NOSTRILS DAILY, Disp: 48 g, Rfl: 6   metoprolol succinate (TOPROL-XL) 25 MG 24 hr tablet, TAKE 2 TABLETS BY MOUTH EVERY DAY, Disp: 30 tablet, Rfl: 6   Multiple Vitamin (MULTIVITAMIN)  tablet, Take 1 tablet by mouth daily., Disp: , Rfl:    omeprazole (PRILOSEC) 20 MG capsule, Take 1 capsule (20 mg total) by mouth daily., Disp: 180 capsule, Rfl: 1   simvastatin (ZOCOR) 10 MG tablet, Take 1 tablet (10 mg total) by mouth at bedtime., Disp: 90 tablet, Rfl: 3   vitamin B-12 (CYANOCOBALAMIN) 1000 MCG tablet, Take 1,000 mcg by mouth daily., Disp: , Rfl:    Allergies  Allergen Reactions   Codeine Nausea And Vomiting   Peanut-Containing Drug Products Diarrhea and Nausea And Vomiting    Pine nut   Phenergan [Promethazine Hcl] Other (See Comments)    tremors    ROS Review of Systems  Constitutional: Negative.   HENT: Negative.    Eyes: Negative.   Respiratory: Negative.    Cardiovascular: Negative.   Gastrointestinal: Negative.   Endocrine: Negative.   Genitourinary: Negative.   Musculoskeletal: Negative.   Skin: Negative.   Allergic/Immunologic: Negative.   Neurological: Negative.   Hematological: Negative.   Psychiatric/Behavioral: Negative.    All other systems reviewed and are negative.    Objective:    Physical Exam Vitals reviewed.  Constitutional:      Appearance: Normal appearance.  HENT:     Mouth/Throat:     Mouth: Mucous membranes are moist.  Eyes:     Pupils: Pupils are equal, round, and reactive to light.  Neck:     Vascular: No carotid bruit.  Cardiovascular:     Rate and Rhythm: Normal rate and regular rhythm.     Pulses: Normal pulses.     Heart sounds: Normal heart sounds.  Pulmonary:     Effort: Pulmonary effort is normal.     Breath sounds: Normal breath sounds.  Abdominal:     General: Bowel sounds are normal.     Palpations: Abdomen is soft. There is no hepatomegaly, splenomegaly or mass.     Tenderness: There is no abdominal tenderness.     Hernia: No hernia is present.  Musculoskeletal:        General: No tenderness.     Cervical back: Neck supple.     Right lower leg: No edema.     Left lower leg: No edema.  Skin:     Findings: No rash.  Neurological:     Mental Status: She is alert and oriented to person, place, and time.     Motor: No weakness.  Psychiatric:        Mood and Affect: Mood and affect normal.        Behavior: Behavior normal.    BP Marland Kitchen)  127/59    Pulse 69    Ht 5' 4.5" (1.638 m)    Wt 154 lb 9.6 oz (70.1 kg)    BMI 26.13 kg/m  Wt Readings from Last 3 Encounters:  11/16/21 154 lb 9.6 oz (70.1 kg)  09/21/21 158 lb 6.4 oz (71.8 kg)  08/17/21 155 lb 14.4 oz (70.7 kg)     There are no preventive care reminders to display for this patient.  There are no preventive care reminders to display for this patient.  Lab Results  Component Value Date   TSH 2.020 06/12/2021   Lab Results  Component Value Date   WBC 8.0 08/07/2021   HGB 14.5 08/07/2021   HCT 41.3 08/07/2021   MCV 98.6 08/07/2021   PLT 246 08/07/2021   Lab Results  Component Value Date   NA 141 08/07/2021   K 3.8 08/07/2021   CO2 25 08/07/2021   GLUCOSE 104 (H) 08/07/2021   BUN 18 08/07/2021   CREATININE 0.85 08/07/2021   BILITOT 0.6 06/12/2021   ALKPHOS 90 06/12/2021   AST 27 06/12/2021   ALT 18 06/12/2021   PROT 6.9 06/12/2021   ALBUMIN 4.3 06/12/2021   CALCIUM 9.4 08/07/2021   ANIONGAP 8 08/07/2021   EGFR 76 06/12/2021   Lab Results  Component Value Date   CHOL 201 (H) 06/12/2021   Lab Results  Component Value Date   HDL 77 06/12/2021   Lab Results  Component Value Date   LDLCALC 110 (H) 06/12/2021   Lab Results  Component Value Date   TRIG 78 06/12/2021   Lab Results  Component Value Date   CHOLHDL 2.6 06/12/2021   Lab Results  Component Value Date   HGBA1C 5.4 06/12/2021      Assessment & Plan:   Problem List Items Addressed This Visit       Cardiovascular and Mediastinum   Paroxysmal atrial fibrillation (HCC) - Primary    Atrial fibrillation is stable at the present time        Respiratory   Allergic rhinitis    Take Claritin 5 mg as needed        Digestive   Acid  reflux    - The patient's GERD is stable on medication.  - Instructed the patient to avoid eating spicy and acidic foods, as well as foods high in fat. - Instructed the patient to avoid eating large meals or meals 2-3 hours prior to sleeping.        Musculoskeletal and Integument   Primary osteoarthritis of right knee    Stable at the present time        Other   Clinical depression    - Patient experiencing high levels of anxiety.  - Encouraged patient to engage in relaxing activities like yoga, meditation, journaling, going for a walk, or participating in a hobby.  - Encouraged patient to reach out to trusted friends or family members about recent struggles, Patient was advised to read A book, how to stop worrying and start living, it is good book to read to control  the stress       Hypercholesterolemia    Hypercholesterolemia  I advised the patient to follow Mediterranean diet This diet is rich in fruits vegetables and whole grain, and This diet is also rich in fish and lean meat Patient should also eat a handful of almonds or walnuts daily Recent heart study indicated that average follow-up on this kind of diet reduces the cardiovascular mortality by  50 to 70%==       No orders of the defined types were placed in this encounter.   Follow-up: No follow-ups on file.    Cletis Athens, MD

## 2021-11-16 NOTE — Assessment & Plan Note (Signed)
-   The patient's GERD is stable on medication.  - Instructed the patient to avoid eating spicy and acidic foods, as well as foods high in fat. - Instructed the patient to avoid eating large meals or meals 2-3 hours prior to sleeping. 

## 2021-11-16 NOTE — Assessment & Plan Note (Signed)
Take Claritin 5 mg as needed

## 2022-01-05 ENCOUNTER — Other Ambulatory Visit: Payer: Self-pay | Admitting: *Deleted

## 2022-01-05 DIAGNOSIS — F3342 Major depressive disorder, recurrent, in full remission: Secondary | ICD-10-CM

## 2022-01-05 DIAGNOSIS — I480 Paroxysmal atrial fibrillation: Secondary | ICD-10-CM

## 2022-01-05 DIAGNOSIS — K219 Gastro-esophageal reflux disease without esophagitis: Secondary | ICD-10-CM

## 2022-01-05 DIAGNOSIS — E7800 Pure hypercholesterolemia, unspecified: Secondary | ICD-10-CM

## 2022-01-05 MED ORDER — CITALOPRAM HYDROBROMIDE 20 MG PO TABS: 20.0000 mg | ORAL_TABLET | Freq: Every day | ORAL | 3 refills | Status: DC

## 2022-01-05 MED ORDER — OMEPRAZOLE 20 MG PO CPDR: 20.0000 mg | DELAYED_RELEASE_CAPSULE | Freq: Every day | ORAL | 1 refills | Status: DC

## 2022-01-05 MED ORDER — SIMVASTATIN 10 MG PO TABS: 10.0000 mg | ORAL_TABLET | Freq: Every day | ORAL | 3 refills | Status: DC

## 2022-01-05 MED ORDER — METOPROLOL SUCCINATE ER 25 MG PO TB24: 50.0000 mg | ORAL_TABLET | Freq: Every day | ORAL | 3 refills | Status: DC

## 2022-01-05 MED ORDER — APIXABAN 5 MG PO TABS: 5.0000 mg | ORAL_TABLET | Freq: Two times a day (BID) | ORAL | 1 refills | Status: DC

## 2022-03-02 ENCOUNTER — Other Ambulatory Visit: Payer: Self-pay | Admitting: *Deleted

## 2022-03-02 DIAGNOSIS — E7800 Pure hypercholesterolemia, unspecified: Secondary | ICD-10-CM

## 2022-03-02 MED ORDER — SIMVASTATIN 10 MG PO TABS: 10.0000 mg | ORAL_TABLET | Freq: Every day | ORAL | 3 refills | Status: DC

## 2022-03-16 ENCOUNTER — Encounter: Payer: Self-pay | Admitting: Internal Medicine

## 2022-03-16 ENCOUNTER — Ambulatory Visit (INDEPENDENT_AMBULATORY_CARE_PROVIDER_SITE_OTHER): Payer: Medicare Other | Admitting: Internal Medicine

## 2022-03-16 VITALS — BP 120/60 | HR 62 | Ht 64.5 in | Wt 159.1 lb

## 2022-03-16 DIAGNOSIS — M1711 Unilateral primary osteoarthritis, right knee: Secondary | ICD-10-CM

## 2022-03-16 DIAGNOSIS — J309 Allergic rhinitis, unspecified: Secondary | ICD-10-CM

## 2022-03-16 DIAGNOSIS — K219 Gastro-esophageal reflux disease without esophagitis: Secondary | ICD-10-CM | POA: Diagnosis not present

## 2022-03-16 DIAGNOSIS — F1010 Alcohol abuse, uncomplicated: Secondary | ICD-10-CM

## 2022-03-16 DIAGNOSIS — E7800 Pure hypercholesterolemia, unspecified: Secondary | ICD-10-CM | POA: Diagnosis not present

## 2022-03-16 DIAGNOSIS — F3342 Major depressive disorder, recurrent, in full remission: Secondary | ICD-10-CM

## 2022-03-16 MED ORDER — FLUTICASONE PROPIONATE 50 MCG/ACT NA SUSP
NASAL | 3 refills | Status: DC
Start: 2022-03-16 — End: 2023-02-02

## 2022-03-16 MED ORDER — OMEPRAZOLE 20 MG PO CPDR: 20.0000 mg | DELAYED_RELEASE_CAPSULE | Freq: Every day | ORAL | 3 refills | Status: DC

## 2022-03-16 MED ORDER — CITALOPRAM HYDROBROMIDE 20 MG PO TABS
20.0000 mg | ORAL_TABLET | Freq: Every day | ORAL | 3 refills | Status: DC
Start: 2022-03-16 — End: 2023-02-04

## 2022-03-16 NOTE — Assessment & Plan Note (Signed)
Patient is drinking less, she was advised that she cannot take more than 4 drinks a week

## 2022-03-16 NOTE — Progress Notes (Signed)
Established Patient Office Visit  Subjective:  Patient ID: Lisa Caldwell, female    DOB: 24-Apr-1946  Age: 76 y.o. MRN: [id number]  CC:  Chief Complaint  Patient presents with   Follow-up    HPI  Lisa Caldwell presents forcheck up, she is doing well  Past Medical History:  Diagnosis Date   Allergy    Anxiety    Cancer ([place])    colon cancer   Depression    GERD (gastroesophageal reflux disease)    Headache(784.0)    Inflammatory polyps of colon with rectal bleeding ([place]) 2008    rt colectomy for a polyp with carcinoma in situ    Personal history of malignant neoplasm of large intestine     Past Surgical History:  Procedure Laterality Date   [place] Right 2008   COLONOSCOPY  2012   Dr. Jamal Collin   COLONOSCOPY WITH PROPOFOL N/A 03/10/2016   Procedure: COLONOSCOPY WITH PROPOFOL;  Surgeon: [name], MD;  Location: [facility] ENDOSCOPY;  Service: Endoscopy;  Laterality: N/A;   COLONOSCOPY WITH PROPOFOL N/A 03/09/2021   Procedure: COLONOSCOPY WITH PROPOFOL;  Surgeon: Jonathon Bellows, MD;  Location[facility] ENDOSCOPY;  Service: Gastroenterology;  Laterality: N/A;   FOOT SURGERY Left 2012   FOOT SURGERY Left 2013   THYROGLOSSAL DUCT CYST N/A 09/04/2015   Procedure: THYROGLOSSAL DUCT CYST;  Surgeon: Carloyn Manner, MD;  Location: [facility] ORS;  Service: ENT;  Laterality: N/A;    Family History  Problem Relation Age of Onset   Heart disease Mother    Heart disease Father    Drug abuse Brother    Breast cancer Neg Hx     Social History   Socioeconomic History   Marital status: Married    Spouse name: Tom   Number of children: 2   Years of education: Not on file   Highest education level: 12th grade  Occupational History   Occupation: Retired  Tobacco Use   Smoking status: Never   Smokeless tobacco: Never  Substance and Sexual Activity   Alcohol use: Yes    Alcohol/week: 14.0 standard drinks    Types: 14 Cans of  beer per week    Comment: 2 a day   Drug use: No   Sexual activity: Never    Birth control/protection: Other-see comments    Comment: hysterectomy  Other Topics Concern   Not on file  Social History Narrative   Not on file   Social Determinants of Health   Financial Resource Strain: Low Risk    Difficulty of Paying Living Expenses: Not hard at all  Food Insecurity: No Food Insecurity   Worried About Charity fundraiser in the Last Year: Never true   [place] in the Last Year: Never true  Transportation Needs: No Transportation Needs   Lack of Transportation (Medical): No   Lack of Transportation (Non-Medical): No  Physical Activity: Sufficiently Active   Days of Exercise per Week: 5 days   Minutes of Exercise per Session: 40 min  Stress: No Stress Concern Present   Feeling of Stress : Not at all  Social Connections: Moderately Isolated   Frequency of Communication with Friends and Family: More than three times a week   Frequency of Social Gatherings with Friends and Family: More than three times a week   Attends Religious Services: Never   Marine scientist or Organizations: No   Attends [employer]  or Organization Meetings: Never   Marital Status: Married  Human resources officer Violence: Not At Risk   Fear of Current or Ex-Partner: No   Emotionally Abused: No   Physically Abused: No   Sexually Abused: No     Current Outpatient Medications:    apixaban (ELIQUIS) 5 MG TABS tablet, Take 1 tablet (5 mg total) by mouth 2 (two) times daily., Disp: 180 tablet, Rfl: 1   Cholecalciferol (VITAMIN D3 PO), Take by mouth daily. , Disp: , Rfl:    citalopram (CELEXA) 20 MG tablet, Take 1 tablet (20 mg total) by mouth daily., Disp: 90 tablet, Rfl: 3   fluticasone (FLONASE) 50 MCG/ACT nasal spray, USE 2 SPRAYS IN BOTH  NOSTRILS DAILY, Disp: 48 g, Rfl: 3   metoprolol succinate (TOPROL-XL) 25 MG 24 hr tablet, Take 2 tablets (50 mg total) by mouth daily., Disp: 90 tablet, Rfl: 3    Multiple Vitamin (MULTIVITAMIN) tablet, Take 1 tablet by mouth daily., Disp: , Rfl:    omeprazole (PRILOSEC) 20 MG capsule, Take 1 capsule (20 mg total) by mouth daily., Disp: 180 capsule, Rfl: 3   simvastatin (ZOCOR) 10 MG tablet, Take 1 tablet (10 mg total) by mouth at bedtime., Disp: 90 tablet, Rfl: 3   vitamin B-12 (CYANOCOBALAMIN) 1000 MCG tablet, Take 1,000 mcg by mouth daily., Disp: , Rfl:    Allergies  Allergen Reactions   Codeine Nausea And Vomiting   Peanut-Containing Drug Products Diarrhea and Nausea And Vomiting    Pine nut   Phenergan [Promethazine Hcl] Other (See Comments)    tremors    ROS Review of Systems  Constitutional: Negative.   HENT: Negative.    Eyes: Negative.   Respiratory: Negative.    Cardiovascular: Negative.   Gastrointestinal: Negative.   Endocrine: Negative.   Genitourinary: Negative.   Musculoskeletal: Negative.   Skin: Negative.   Allergic/Immunologic: Negative.   Neurological: Negative.   Hematological: Negative.   Psychiatric/Behavioral: Negative.    All other systems reviewed and are negative.    Objective:    Physical Exam Vitals reviewed.  Constitutional:      Appearance: Normal appearance.  HENT:     Mouth/Throat:     Mouth: Mucous membranes are moist.  Eyes:     Pupils: Pupils are equal, round, and reactive to light.  Neck:     Vascular: No carotid bruit.  Cardiovascular:     Rate and Rhythm: Normal rate and regular rhythm.     Pulses: Normal pulses.     Heart sounds: Normal heart sounds.  Pulmonary:     Effort: Pulmonary effort is normal.     Breath sounds: Normal breath sounds.  Abdominal:     General: Bowel sounds are normal.     Palpations: Abdomen is soft. There is no hepatomegaly, splenomegaly or mass.     Tenderness: There is no abdominal tenderness.     Hernia: No hernia is present.  Musculoskeletal:        General: No tenderness.     Cervical back: Neck supple.     Right lower leg: No edema.     Left lower  leg: No edema.  Skin:    Findings: No rash.  Neurological:     Mental Status: She is alert and oriented to person, place, and time.     Motor: No weakness.  Psychiatric:        Mood and Affect: Mood and affect normal.        Behavior: Behavior normal.  BP 120/60   Pulse 62   Ht 5' 4.5" (1.638 m)   Wt 159 lb 1.6 oz (72.2 kg)   BMI 26.89 kg/m  Wt Readings from Last 3 Encounters:  03/16/22 159 lb 1.6 oz (72.2 kg)  11/16/21 154 lb 9.6 oz (70.1 kg)  09/21/21 158 lb 6.4 oz (71.8 kg)     There are no preventive care reminders to display for this patient.  There are no preventive care reminders to display for this patient.  Lab Results  Component Value Date   TSH 2.020 06/12/2021   Lab Results  Component Value Date   WBC 8.0 08/07/2021   HGB 14.5 08/07/2021   HCT 41.3 08/07/2021   MCV 98.6 08/07/2021   PLT 246 08/07/2021   Lab Results  Component Value Date   NA 141 08/07/2021   K 3.8 08/07/2021   CO2 25 08/07/2021   GLUCOSE 104 (H) 08/07/2021   BUN 18 08/07/2021   CREATININE 0.85 08/07/2021   BILITOT 0.6 06/12/2021   ALKPHOS 90 06/12/2021   AST 27 06/12/2021   ALT 18 06/12/2021   PROT 6.9 06/12/2021   ALBUMIN 4.3 06/12/2021   CALCIUM 9.4 08/07/2021   ANIONGAP 8 08/07/2021   EGFR 76 06/12/2021   Lab Results  Component Value Date   CHOL 201 (H) 06/12/2021   Lab Results  Component Value Date   HDL 77 06/12/2021   Lab Results  Component Value Date   LDLCALC 110 (H) 06/12/2021   Lab Results  Component Value Date   TRIG 78 06/12/2021   Lab Results  Component Value Date   CHOLHDL 2.6 06/12/2021   Lab Results  Component Value Date   HGBA1C 5.4 06/12/2021      Assessment & Plan:   Problem List Items Addressed This Visit       Respiratory   Allergic rhinitis    Take Claritin 5 mg p.o. daily       Relevant Medications   fluticasone (FLONASE) 50 MCG/ACT nasal spray     Digestive   Acid reflux    Patient educated extensively on acid  reflux lifestyle modification, including buying a bed wedge, not eating 3 hrs before bedtime, diet modifications, and handout given for the same.        Relevant Medications   omeprazole (PRILOSEC) 20 MG capsule     Musculoskeletal and Integument   Primary osteoarthritis of right knee - Primary    She does not complain of any pain in the knee today.  Circulation is intact         Other   Clinical depression    Stable at the present time       Relevant Medications   citalopram (CELEXA) 20 MG tablet   Alcohol abuse    Patient is drinking less, she was advised that she cannot take more than 4 drinks a week       Hypercholesterolemia    Hypercholesterolemia  I advised the patient to follow Mediterranean diet This diet is rich in fruits vegetables and whole grain, and This diet is also rich in fish and lean meat Patient should also eat a handful of almonds or walnuts daily Recent heart study indicated that average follow-up on this kind of diet reduces the cardiovascular mortality by 50 to 70%==        Meds ordered this encounter  Medications   citalopram (CELEXA) 20 MG tablet    Sig: Take 1 tablet (20 mg total) by mouth  daily.    Dispense:  90 tablet    Refill:  3    Requesting 1 year supply   fluticasone (FLONASE) 50 MCG/ACT nasal spray    Sig: USE 2 SPRAYS IN BOTH  NOSTRILS DAILY    Dispense:  48 g    Refill:  3   omeprazole (PRILOSEC) 20 MG capsule    Sig: Take 1 capsule (20 mg total) by mouth daily.    Dispense:  180 capsule    Refill:  3    Requesting 1 year supply    Follow-up: No follow-ups on file.    Cletis Athens, MD

## 2022-03-16 NOTE — Assessment & Plan Note (Signed)
Stable at the present time. 

## 2022-03-16 NOTE — Assessment & Plan Note (Signed)
Take Claritin 5 mg p.o. daily 

## 2022-03-16 NOTE — Assessment & Plan Note (Signed)
She does not complain of any pain in the knee today.  Circulation is intact

## 2022-03-16 NOTE — Assessment & Plan Note (Signed)
Patient educated extensively on acid reflux lifestyle modification, including buying a bed wedge, not eating 3 hrs before bedtime, diet modifications, and handout given for the same.  

## 2022-03-16 NOTE — Assessment & Plan Note (Signed)
Hypercholesterolemia  I advised the patient to follow Mediterranean diet This diet is rich in fruits vegetables and whole grain, and This diet is also rich in fish and lean meat Patient should also eat a handful of almonds or walnuts daily Recent heart study indicated that average follow-up on this kind of diet reduces the cardiovascular mortality by 50 to 70%== 

## 2022-03-22 ENCOUNTER — Other Ambulatory Visit: Payer: Self-pay

## 2022-03-26 ENCOUNTER — Other Ambulatory Visit: Payer: Self-pay | Admitting: Internal Medicine

## 2022-03-26 DIAGNOSIS — Z1231 Encounter for screening mammogram for malignant neoplasm of breast: Secondary | ICD-10-CM

## 2022-04-02 ENCOUNTER — Other Ambulatory Visit: Payer: Self-pay | Admitting: Internal Medicine

## 2022-04-19 ENCOUNTER — Ambulatory Visit
Admission: RE | Admit: 2022-04-19 | Discharge: 2022-04-19 | Disposition: A | Discharge status: Home / Self Care | Payer: Medicare Other | Source: Ambulatory Visit | Attending: Internal Medicine | Admitting: Internal Medicine

## 2022-04-19 DIAGNOSIS — Z1231 Encounter for screening mammogram for malignant neoplasm of breast: Secondary | ICD-10-CM | POA: Insufficient documentation

## 2022-04-22 DIAGNOSIS — H524 Presbyopia: Secondary | ICD-10-CM | POA: Diagnosis not present

## 2022-04-27 ENCOUNTER — Other Ambulatory Visit: Payer: Self-pay | Admitting: Internal Medicine

## 2022-04-27 DIAGNOSIS — I480 Paroxysmal atrial fibrillation: Secondary | ICD-10-CM

## 2022-04-27 MED ORDER — VITAMIN B-12 1000 MCG PO TABS: 1000.0000 ug | ORAL_TABLET | Freq: Every day | ORAL | 2 refills | Status: DC

## 2022-04-27 MED ORDER — APIXABAN 5 MG PO TABS
5.0000 mg | ORAL_TABLET | Freq: Two times a day (BID) | ORAL | 1 refills | Status: DC
Start: 2022-04-27 — End: 2022-07-29

## 2022-07-20 ENCOUNTER — Ambulatory Visit (INDEPENDENT_AMBULATORY_CARE_PROVIDER_SITE_OTHER): Payer: Medicare Other | Admitting: Internal Medicine

## 2022-07-20 ENCOUNTER — Encounter: Payer: Self-pay | Admitting: Internal Medicine

## 2022-07-20 VITALS — BP 116/76 | HR 71 | Ht 64.5 in | Wt 158.1 lb

## 2022-07-20 DIAGNOSIS — K219 Gastro-esophageal reflux disease without esophagitis: Secondary | ICD-10-CM | POA: Diagnosis not present

## 2022-07-20 DIAGNOSIS — J309 Allergic rhinitis, unspecified: Secondary | ICD-10-CM | POA: Diagnosis not present

## 2022-07-20 DIAGNOSIS — M1711 Unilateral primary osteoarthritis, right knee: Secondary | ICD-10-CM | POA: Diagnosis not present

## 2022-07-20 DIAGNOSIS — F3342 Major depressive disorder, recurrent, in full remission: Secondary | ICD-10-CM

## 2022-07-20 DIAGNOSIS — R739 Hyperglycemia, unspecified: Secondary | ICD-10-CM | POA: Diagnosis not present

## 2022-07-20 DIAGNOSIS — I480 Paroxysmal atrial fibrillation: Secondary | ICD-10-CM | POA: Diagnosis not present

## 2022-07-20 DIAGNOSIS — Z23 Encounter for immunization: Secondary | ICD-10-CM

## 2022-07-20 NOTE — Assessment & Plan Note (Signed)
-   The patient's GERD is stable on medication.  - Instructed the patient to avoid eating spicy and acidic foods, as well as foods high in fat. - Instructed the patient to avoid eating large meals or meals 2-3 hours prior to sleeping. 

## 2022-07-20 NOTE — Assessment & Plan Note (Signed)
We will repeat the lab test

## 2022-07-20 NOTE — Assessment & Plan Note (Signed)
Atrial fibrillation is stable

## 2022-07-20 NOTE — Assessment & Plan Note (Signed)
Stable at the present time. 

## 2022-07-20 NOTE — Assessment & Plan Note (Signed)
-   Claritin 5 mg p.o. daily

## 2022-07-20 NOTE — Progress Notes (Signed)
 Established Patient Office Visit  Subjective:  Patient ID: Lisa Caldwell, female    DOB: 01/28/1946  Age: 76 y.o. MRN: [id number]  CC:  Chief Complaint  Patient presents with   Follow-up    HPI  Lisa Caldwell presents for check up  Past Medical History:  Diagnosis Date   Allergy    Anxiety    Cancer (HCC)    colon cancer   Depression    GERD (gastroesophageal reflux disease)    Headache(784.0)    Inflammatory polyps of colon with rectal bleeding (HCC) 2008    rt colectomy for a polyp with carcinoma in situ    Personal history of malignant neoplasm of large intestine     Past Surgical History:  Procedure Laterality Date   ABDOMINAL HYSTERECTOMY  1996   APPENDECTOMY     COLECTOMY Right 2008   COLONOSCOPY  2012   Dr. Sankar   COLONOSCOPY WITH PROPOFOL N/A 03/10/2016   Procedure: COLONOSCOPY WITH PROPOFOL;  Surgeon: Seeplaputhur G Sankar, MD;  Location: [facility] END[facility]COPY;  Service: Endoscopy;  Laterality: N/A;   COLONOSCOPY WITH PROPOFOL N/A 03/09/2021   Procedure: COLONOSCOPY WITH PROPOFOL;  Surgeon: Anna, Kiran, MD;  Location: [facility] ENDOSCOPY;  Service: Gastroenterology;  Laterality: N/A;   FOOT SURGERY Left 2012   FOOT SURGERY Left 2013   THYROGLOSSAL DUCT CYST N/A 09/04/2015   Procedure: THYROGLOSSAL DUCT CYST;  Surgeon: Creighton Vaught, MD;  Location: [facility] ORS;  Service: ENT;  Laterality: N/A;    Family History  Problem Relation Age of Onset   Heart disease Mother    Heart disease Father    Drug abuse Brother    Breast cancer Neg Hx     Social History   Socioeconomic History   Marital status: Married    Spouse name: Tom   Number of children: 2   Years of education: Not on file   Highest education level: 12th grade  Occupational History   Occupation: Retired  Tobacco Use   Smoking status: Never   Smokeless tobacco: Never  Substance and Sexual Activity   Alcohol use: Yes    Alcohol/week: 14.0 standard drinks of alcohol    Types: 14 Cans of beer per  week    Comment: 2 a day   Drug use: No   Sexual activity: Never    Birth control/protection: Other-see comments    Comment: hysterectomy  Other Topics Concern   Not on file  Social History Narrative   Not on file   Social Determinants of Health   Financial Resource Strain: Low Risk  (10/23/2021)   Overall Financial Resource Strain (CARDIA)    Difficulty of Paying Living Expenses: Not hard at all  Food Insecurity: No Food Insecurity (10/23/2021)   Hunger Vital Sign    Worried About Running Out of Food in the Last Year: Never true    Ran Out of Food in the Last Year: Never true  Transportation Needs: No Transportation Needs (10/23/2021)   PRAPARE - Transportation    Lack of Transportation (Medical): No    Lack of Transportation (Non-Medical): No  Physical Activity: Sufficiently Active (10/23/2021)   Exercise Vital Sign    Days of Exercise per Week: 5 days    Minutes of Exercise per Session: 40 min  Stress: No Stress Concern Present (10/23/2021)   [employer] - Occupational Stress Questionnaire    Feeling of Stress : Not at all  Social Connections: Moderately Isolated (10/23/2021)   Social Connection   and Isolation Panel [NHANES]    Frequency of Communication with Friends and Family: More than three times a week    Frequency of Social Gatherings with Friends and Family: More than three times a week    Attends Religious Services: Never    Active Member of Clubs or Organizations: No    Attends Club or Organization Meetings: Never    Marital Status: Married  Intimate Partner Violence: Not At Risk (10/23/2021)   Humiliation, Afraid, Rape, and Kick questionnaire    Fear of Current or Ex-Partner: No    Emotionally Abused: No    Physically Abused: No    Sexually Abused: No     Current Outpatient Medications:    Cholecalciferol (VITAMIN D3 PO), Take by mouth daily. , Disp: , Rfl:    citalopram (CELEXA) 20 MG tablet, Take 1 tablet (20 mg total) by mouth daily.,  Disp: 90 tablet, Rfl: 3   fluticasone (FLONASE) 50 MCG/ACT nasal spray, USE 2 SPRAYS IN BOTH  NOSTRILS DAILY, Disp: 48 g, Rfl: 3   metoprolol succinate (TOPROL-XL) 25 MG 24 hr tablet, TAKE 2 TABLETS BY MOUTH DAILY, Disp: 200 tablet, Rfl: 2   Multiple Vitamin (MULTIVITAMIN) tablet, Take 1 tablet by mouth daily., Disp: , Rfl:    omeprazole (PRILOSEC) 20 MG capsule, Take 1 capsule (20 mg total) by mouth daily., Disp: 180 capsule, Rfl: 3   simvastatin (ZOCOR) 10 MG tablet, Take 1 tablet (10 mg total) by mouth at bedtime., Disp: 90 tablet, Rfl: 3   vitamin B-12 (CYANOCOBALAMIN) 1000 MCG tablet, Take 1 tablet (1,000 mcg total) by mouth daily., Disp: 90 tablet, Rfl: 2   apixaban (ELIQUIS) 5 MG TABS tablet, Take 1 tablet (5 mg total) by mouth 2 (two) times daily., Disp: 180 tablet, Rfl: 1   Allergies  Allergen Reactions   Codeine Nausea And Vomiting   Peanut-Containing Drug Products Diarrhea and Nausea And Vomiting    Pine nut   Phenergan [Promethazine Hcl] Other (See Comments)    tremors    ROS Review of Systems  Constitutional: Negative.   HENT: Negative.    Eyes: Negative.   Respiratory: Negative.    Cardiovascular: Negative.   Gastrointestinal: Negative.   Endocrine: Negative.   Genitourinary: Negative.   Musculoskeletal: Negative.   Skin: Negative.   Allergic/Immunologic: Negative.   Neurological: Negative.   Hematological: Negative.   Psychiatric/Behavioral: Negative.    All other systems reviewed and are negative.     Objective:    Physical Exam Vitals reviewed.  Constitutional:      Appearance: Normal appearance.  HENT:     Mouth/Throat:     Mouth: Mucous membranes are moist.  Eyes:     Pupils: Pupils are equal, round, and reactive to light.  Neck:     Vascular: No carotid bruit.  Cardiovascular:     Rate and Rhythm: Normal rate and regular rhythm.     Pulses: Normal pulses.     Heart sounds: Normal heart sounds.  Pulmonary:     Effort: Pulmonary effort is  normal.     Breath sounds: Normal breath sounds.  Abdominal:     General: Bowel sounds are normal.     Palpations: Abdomen is soft. There is no hepatomegaly, splenomegaly or mass.     Tenderness: There is no abdominal tenderness.     Hernia: No hernia is present.  Musculoskeletal:        General: No tenderness.     Cervical back: Neck supple.       Right lower leg: No edema.     Left lower leg: No edema.  Skin:    Findings: No rash.  Neurological:     Mental Status: She is alert and oriented to person, place, and time.     Motor: No weakness.  Psychiatric:        Mood and Affect: Mood and affect normal.        Behavior: Behavior normal.     BP 116/76   Pulse 71   Ht 5' 4.5" (1.638 m)   Wt 158 lb 1.6 oz (71.7 kg)   BMI 26.72 kg/m  Wt Readings from Last 3 Encounters:  07/20/22 158 lb 1.6 oz (71.7 kg)  03/16/22 159 lb 1.6 oz (72.2 kg)  11/16/21 154 lb 9.6 oz (70.1 kg)     Health Maintenance Due  Topic Date Due   COVID-19 Vaccine (6 - Pfizer series) 09/07/2021    There are no preventive care reminders to display for this patient.  Lab Results  Component Value Date   TSH 2.020 06/12/2021   Lab Results  Component Value Date   WBC 8.0 08/07/2021   HGB 14.5 08/07/2021   HCT 41.3 08/07/2021   MCV 98.6 08/07/2021   PLT 246 08/07/2021   Lab Results  Component Value Date   NA 141 08/07/2021   K 3.8 08/07/2021   CO2 25 08/07/2021   GLUCOSE 104 (H) 08/07/2021   BUN 18 08/07/2021   CREATININE 0.85 08/07/2021   BILITOT 0.6 06/12/2021   ALKPHOS 90 06/12/2021   AST 27 06/12/2021   ALT 18 06/12/2021   PROT 6.9 06/12/2021   ALBUMIN 4.3 06/12/2021   CALCIUM 9.4 08/07/2021   ANIONGAP 8 08/07/2021   EGFR 76 06/12/2021   Lab Results  Component Value Date   CHOL 201 (H) 06/12/2021   Lab Results  Component Value Date   HDL 77 06/12/2021   Lab Results  Component Value Date   LDLCALC 110 (H) 06/12/2021   Lab Results  Component Value Date   TRIG 78 06/12/2021    Lab Results  Component Value Date   CHOLHDL 2.6 06/12/2021   Lab Results  Component Value Date   HGBA1C 5.4 06/12/2021      Assessment & Plan:   Problem List Items Addressed This Visit       Cardiovascular and Mediastinum   Paroxysmal atrial fibrillation (HCC)    Atrial fibrillation is stable        Respiratory   Chronic allergic rhinitis    - Claritin 5 mg p.o. daily        Digestive   Acid reflux    - The patient's GERD is stable on medication.  - Instructed the patient to avoid eating spicy and acidic foods, as well as foods high in fat. - Instructed the patient to avoid eating large meals or meals 2-3 hours prior to sleeping.        Musculoskeletal and Integument   Primary osteoarthritis of right knee    Stable at the present time        Other   Clinical depression   Elevated blood sugar    We will repeat the lab test      Other Visit Diagnoses     Need for influenza vaccination    -  Primary   Relevant Orders   Flu Vaccine QUAD High Dose(Fluad) (Completed)       No orders of the defined types were placed in this encounter.     Follow-up: No follow-ups on file.     , MD 

## 2022-07-23 ENCOUNTER — Ambulatory Visit (INDEPENDENT_AMBULATORY_CARE_PROVIDER_SITE_OTHER): Payer: Medicare Other | Admitting: *Deleted

## 2022-07-23 DIAGNOSIS — E7800 Pure hypercholesterolemia, unspecified: Secondary | ICD-10-CM

## 2022-07-23 DIAGNOSIS — I480 Paroxysmal atrial fibrillation: Secondary | ICD-10-CM | POA: Diagnosis not present

## 2022-07-23 DIAGNOSIS — Z0000 Encounter for general adult medical examination without abnormal findings: Secondary | ICD-10-CM | POA: Diagnosis not present

## 2022-07-23 DIAGNOSIS — Z1329 Encounter for screening for other suspected endocrine disorder: Secondary | ICD-10-CM | POA: Diagnosis not present

## 2022-07-24 LAB — COMPLETE METABOLIC PANEL WITH GFR
AG Ratio: 1.6 (calc) (ref 1.0–2.5)
ALT: 20 U/L (ref 6–29)
AST: 23 U/L (ref 10–35)
Albumin: 4.2 g/dL (ref 3.6–5.1)
Alkaline phosphatase (APISO): 62 U/L (ref 37–153)
BUN: 18 mg/dL (ref 7–25)
CO2: 20 mmol/L (ref 20–32)
Calcium: 9.2 mg/dL (ref 8.6–10.4)
Chloride: 109 mmol/L (ref 98–110)
Creat: 0.74 mg/dL (ref 0.60–1.00)
Globulin: 2.7 g/dL (calc) (ref 1.9–3.7)
Glucose, Bld: 102 mg/dL — ABNORMAL HIGH (ref 65–99)
Potassium: 4.7 mmol/L (ref 3.5–5.3)
Sodium: 141 mmol/L (ref 135–146)
Total Bilirubin: 0.6 mg/dL (ref 0.2–1.2)
Total Protein: 6.9 g/dL (ref 6.1–8.1)
eGFR: 84 mL/min/{1.73_m2} (ref >=60)

## 2022-07-24 LAB — CBC WITH DIFFERENTIAL/PLATELET
Absolute Monocytes: 429 cells/uL (ref 200–950)
Basophils Absolute: 50 cells/uL (ref 0–200)
Basophils Relative: 0.9 %
Eosinophils Absolute: 193 cells/uL (ref 15–500)
Eosinophils Relative: 3.5 %
HCT: 38.3 % (ref 35.0–45.0)
Hemoglobin: 12.9 g/dL (ref 11.7–15.5)
Lymphs Abs: 1568 cells/uL (ref 850–3900)
MCH: 34.5 pg — ABNORMAL HIGH (ref 27.0–33.0)
MCHC: 33.7 g/dL (ref 32.0–36.0)
MCV: 102.4 fL — ABNORMAL HIGH (ref 80.0–100.0)
MPV: 9.9 fL (ref 7.5–12.5)
Monocytes Relative: 7.8 %
Neutro Abs: 3262 cells/uL (ref 1500–7800)
Neutrophils Relative %: 59.3 %
Platelets: 242 10*3/uL (ref 140–400)
RBC: 3.74 10*6/uL — ABNORMAL LOW (ref 3.80–5.10)
RDW: 11.4 % (ref 11.0–15.0)
Total Lymphocyte: 28.5 %
WBC: 5.5 10*3/uL (ref 3.8–10.8)

## 2022-07-24 LAB — LIPID PANEL
Cholesterol: 195 mg/dL (ref <200)
HDL: 64 mg/dL (ref >=50)
LDL Cholesterol (Calc): 113 mg/dL (calc) — ABNORMAL HIGH
Non-HDL Cholesterol (Calc): 131 mg/dL (calc) — ABNORMAL HIGH (ref <130)
Total CHOL/HDL Ratio: 3 (calc) (ref <5.0)
Triglycerides: 79 mg/dL (ref <150)

## 2022-07-24 LAB — T4: T4, Total: 5.4 ug/dL (ref 5.1–11.9)

## 2022-07-24 LAB — T3: T3, Total: 95 ng/dL (ref 76–181)

## 2022-07-24 LAB — TSH: TSH: 3.04 mIU/L (ref 0.40–4.50)

## 2022-07-29 ENCOUNTER — Other Ambulatory Visit: Payer: Self-pay | Admitting: *Deleted

## 2022-07-29 DIAGNOSIS — I480 Paroxysmal atrial fibrillation: Secondary | ICD-10-CM

## 2022-07-29 DIAGNOSIS — E7800 Pure hypercholesterolemia, unspecified: Secondary | ICD-10-CM

## 2022-07-29 MED ORDER — APIXABAN 5 MG PO TABS: 5.0000 mg | ORAL_TABLET | Freq: Two times a day (BID) | ORAL | 3 refills | Status: DC

## 2022-07-29 MED ORDER — METOPROLOL SUCCINATE ER 25 MG PO TB24: 50.0000 mg | ORAL_TABLET | Freq: Every day | ORAL | 3 refills | Status: DC

## 2022-07-29 MED ORDER — SIMVASTATIN 10 MG PO TABS: 10.0000 mg | ORAL_TABLET | Freq: Every day | ORAL | 3 refills | Status: DC

## 2022-08-19 ENCOUNTER — Other Ambulatory Visit: Payer: Self-pay | Admitting: *Deleted

## 2022-08-19 DIAGNOSIS — E7800 Pure hypercholesterolemia, unspecified: Secondary | ICD-10-CM

## 2022-08-19 DIAGNOSIS — I480 Paroxysmal atrial fibrillation: Secondary | ICD-10-CM

## 2022-08-19 MED ORDER — APIXABAN 5 MG PO TABS: 5.0000 mg | ORAL_TABLET | Freq: Two times a day (BID) | ORAL | 3 refills | Status: DC

## 2022-08-19 MED ORDER — SIMVASTATIN 10 MG PO TABS: 10.0000 mg | ORAL_TABLET | Freq: Every day | ORAL | 3 refills | Status: DC

## 2022-08-19 MED ORDER — METOPROLOL SUCCINATE ER 25 MG PO TB24: 50.0000 mg | ORAL_TABLET | Freq: Every day | ORAL | 3 refills | Status: DC

## 2022-10-02 ENCOUNTER — Emergency Department: Payer: Medicare Other

## 2022-10-02 ENCOUNTER — Emergency Department
Admission: EM | Admit: 2022-10-02 | Discharge: 2022-10-03 | Disposition: A | Discharge status: Home / Self Care | Payer: Medicare Other | Attending: Emergency Medicine | Admitting: Emergency Medicine

## 2022-10-02 DIAGNOSIS — S43014A Anterior dislocation of right humerus, initial encounter: Secondary | ICD-10-CM | POA: Diagnosis not present

## 2022-10-02 DIAGNOSIS — S4291XA Fracture of right shoulder girdle, part unspecified, initial encounter for closed fracture: Secondary | ICD-10-CM

## 2022-10-02 DIAGNOSIS — I10 Essential (primary) hypertension: Secondary | ICD-10-CM | POA: Diagnosis not present

## 2022-10-02 DIAGNOSIS — Z85038 Personal history of other malignant neoplasm of large intestine: Secondary | ICD-10-CM | POA: Diagnosis not present

## 2022-10-02 DIAGNOSIS — Z7901 Long term (current) use of anticoagulants: Secondary | ICD-10-CM | POA: Diagnosis not present

## 2022-10-02 DIAGNOSIS — S4991XA Unspecified injury of right shoulder and upper arm, initial encounter: Secondary | ICD-10-CM | POA: Diagnosis present

## 2022-10-02 DIAGNOSIS — Z79899 Other long term (current) drug therapy: Secondary | ICD-10-CM | POA: Insufficient documentation

## 2022-10-02 DIAGNOSIS — W1839XA Other fall on same level, initial encounter: Secondary | ICD-10-CM | POA: Diagnosis not present

## 2022-10-02 DIAGNOSIS — S42291A Other displaced fracture of upper end of right humerus, initial encounter for closed fracture: Secondary | ICD-10-CM | POA: Insufficient documentation

## 2022-10-02 DIAGNOSIS — S43004A Unspecified dislocation of right shoulder joint, initial encounter: Secondary | ICD-10-CM | POA: Diagnosis not present

## 2022-10-02 HISTORY — DX: Essential (primary) hypertension: I10

## 2022-10-02 MED ORDER — ACETAMINOPHEN 500 MG PO TABS
1000.0000 mg | ORAL_TABLET | Freq: Once | ORAL | Status: AC
  Administered 2022-10-02: 1000 mg via ORAL
  Filled 2022-10-02: qty 2

## 2022-10-02 NOTE — ED Triage Notes (Signed)
Pt reports stumped her toe and fell, striking her right shoulder on the deck, denies hitting her head. Pt is unable to move her right arm, appears right shoulder may be dislocated. + right radial pulse, pt is able to wiggle her fingers to the right hand, sensation intact. ETOH tonight as FYI.

## 2022-10-03 ENCOUNTER — Emergency Department: Payer: Medicare Other

## 2022-10-03 DIAGNOSIS — S43004A Unspecified dislocation of right shoulder joint, initial encounter: Secondary | ICD-10-CM | POA: Diagnosis not present

## 2022-10-03 MED ORDER — FENTANYL CITRATE PF 50 MCG/ML IJ SOSY
50.0000 ug | PREFILLED_SYRINGE | Freq: Once | INTRAMUSCULAR | Status: AC
  Administered 2022-10-03: 50 ug via INTRAVENOUS
  Filled 2022-10-03: qty 1

## 2022-10-03 MED ORDER — PROPOFOL 10 MG/ML IV BOLUS
0.5000 mg/kg | Freq: Once | INTRAVENOUS | Status: AC
  Administered 2022-10-03: 36.1 mg via INTRAVENOUS
  Filled 2022-10-03: qty 20

## 2022-10-03 MED ORDER — HYDROCODONE-ACETAMINOPHEN 5-325 MG PO TABS: 1.0000 | ORAL_TABLET | Freq: Four times a day (QID) | ORAL | 0 refills | Status: DC | PRN

## 2022-10-03 MED ORDER — SODIUM CHLORIDE 0.9 % IV SOLN: INTRAVENOUS | Status: DC

## 2022-10-03 MED ORDER — ONDANSETRON HCL 4 MG/2ML IJ SOLN
4.0000 mg | Freq: Once | INTRAMUSCULAR | Status: AC
  Administered 2022-10-03: 4 mg via INTRAVENOUS
  Filled 2022-10-03: qty 2

## 2022-10-03 MED ORDER — ONDANSETRON 4 MG PO TBDP: 4.0000 mg | ORAL_TABLET | Freq: Four times a day (QID) | ORAL | 0 refills | Status: DC | PRN

## 2022-10-03 MED ORDER — PROPOFOL 10 MG/ML IV BOLUS
INTRAVENOUS | Status: AC | PRN
  Administered 2022-10-03: 50 mg via INTRAVENOUS

## 2022-10-03 NOTE — ED Provider Notes (Signed)
[facility] Provider Note    Event Date/Time   First MD Initiated Contact with Patient 10/02/22 2337     (approximate)   History   Shoulder Injury   HPI  Lisa Caldwell is a 76 y.o. female with history of hypertension, hyperlipidemia, A-fib on Eliquis who presents to the emergency department with right shoulder pain.  States she stubbed her foot and fell tonight onto her right shoulder.  She is adamant that she did not hit her head or lose consciousness.  She states she has had 3 beers tonight.  No complaints of neck pain, back pain, chest pain or abdominal pain.  She is right-hand dominant.  N.p.o. since 9:30 PM.   History provided by patient.    Past Medical History:  Diagnosis Date   Allergy    Anxiety    Cancer ([place])    colon cancer   Depression    GERD (gastroesophageal reflux disease)    Headache(784.0)    Hypertension    Inflammatory polyps of colon with rectal bleeding ([place]) 2008    rt colectomy for a polyp with carcinoma in situ    Personal history of malignant neoplasm of large intestine     Past Surgical History:  Procedure Laterality Date   [place] Right 2008   COLONOSCOPY  2012   Dr. Jamal Collin   COLONOSCOPY WITH PROPOFOL N/A 03/10/2016   Procedure: COLONOSCOPY WITH PROPOFOL;  Surgeon: [name], MD;  Location: [facility] ENDOSCOPY;  Service: Endoscopy;  Laterality: N/A;   COLONOSCOPY WITH PROPOFOL N/A 03/09/2021   Procedure: COLONOSCOPY WITH PROPOFOL;  Surgeon: Jonathon Bellows, MD;  Location: [facility] ENDOSCOPY;  Service: Gastroenterology;  Laterality: N/A;   FOOT SURGERY Left 2012   FOOT SURGERY Left 2013   THYROGLOSSAL DUCT CYST N/A 09/04/2015   Procedure: THYROGLOSSAL DUCT CYST;  Surgeon: Carloyn Manner, MD;  Location: [facility] ORS;  Service: ENT;  Laterality: N/A;    MEDICATIONS:  Prior to Admission medications   Medication Sig Start Date End Date Taking? Authorizing Provider   apixaban (ELIQUIS) 5 MG TABS tablet Take 1 tablet (5 mg total) by mouth 2 (two) times daily. 08/19/22 11/17/22  Cletis Athens, MD  Cholecalciferol (VITAMIN D3 PO) Take by mouth daily.     [provider]  citalopram (CELEXA) 20 MG tablet Take 1 tablet (20 mg total) by mouth daily. 03/16/22   Cletis Athens, MD  fluticasone (FLONASE) 50 MCG/ACT nasal spray USE 2 SPRAYS IN BOTH  NOSTRILS DAILY 03/16/22   Cletis Athens, MD  metoprolol succinate (TOPROL-XL) 25 MG 24 hr tablet Take 2 tablets (50 mg total) by mouth daily. 08/19/22   Cletis Athens, MD  Multiple Vitamin (MULTIVITAMIN) tablet Take 1 tablet by mouth daily.    [provider]  omeprazole (PRILOSEC) 20 MG capsule Take 1 capsule (20 mg total) by mouth daily. 03/16/22   Cletis Athens, MD  simvastatin (ZOCOR) 10 MG tablet Take 1 tablet (10 mg total) by mouth at bedtime. 08/19/22   Cletis Athens, MD  vitamin B-12 (CYANOCOBALAMIN) 1000 MCG tablet Take 1 tablet (1,000 mcg total) by mouth daily. 04/27/22   Cletis Athens, MD    Physical Exam   Triage Vital Signs: ED Triage Vitals  Enc Vitals Group     BP 10/02/22 2251 (!) 154/93     Pulse Rate 10/02/22 2251 69     Resp 10/02/22 2251 (!) 22     Temp  10/02/22 2251 97.9 F (36.6 C)     Temp Source 10/02/22 2251 Oral     SpO2 10/02/22 2251 96 %     Weight 10/02/22 2252 159 lb (72.1 kg)     Height 10/02/22 2252 '5\' 4"'$  (1.626 m)     Head Circumference --      Peak Flow --      Pain Score 10/02/22 2252 10     Pain Loc --      Pain Edu? --      Excl. in [place]? --     Most recent vital signs: Vitals:   10/03/22 0105 10/03/22 0110  BP: 137/82 (!) 154/71  Pulse: 65 67  Resp: 17 20  Temp:    SpO2: 100% 100%    CONSTITUTIONAL: Alert and oriented and responds appropriately to questions.  Elderly, uncomfortable HEAD: Normocephalic, atraumatic EYES: Conjunctivae clear, pupils appear equal, sclera nonicteric ENT: normal nose; moist mucous membranes NECK: Supple, normal ROM CARD:  RRR; S1 and S2 appreciated; no murmurs, no clicks, no rubs, no gallops RESP: Normal chest excursion without splinting or tachypnea; breath sounds clear and equal bilaterally; no wheezes, no rhonchi, no rales, no hypoxia or respiratory distress, speaking full sentences ABD/GI: Normal bowel sounds; non-distended; soft, non-tender, no rebound, no guarding, no peritoneal signs BACK: The back appears normal EXT: Deformity noted to the right shoulder.  2+ right radial pulse.  Compartments soft. SKIN: Normal color for age and race; warm; no rash on exposed skin NEURO: Moves all extremities equally, normal speech PSYCH: The patient's mood and manner are appropriate.   ED Results / Procedures / Treatments   LABS: (all labs ordered are listed, but only abnormal results are displayed) Labs Reviewed - No data to display   EKG:   RADIOLOGY: My personal review and interpretation of imaging: X-ray shows right anterior inferior shoulder dislocation that was later successfully reduced on next x-ray.  I have personally reviewed all radiology reports.   DG Shoulder Right Portable  Result Date: 10/03/2022 CLINICAL DATA:  Right shoulder dislocation reduction study. EXAM: RIGHT SHOULDER - 1 VIEW COMPARISON:  Post dislocation three-view study yesterday. FINDINGS: Successful dislocation reduction right glenohumeral joint. There is moderate loss of the acromiohumeral space consistent with a rotator cuff tear, which is probably degenerative and chronic as this was also seen on portable chest from 08/07/2021. The posterior humeral head is not well enough seen to assess for a Hill-Sachs lesion. No bony Bankart fracture is evident. Joint narrowing and osteophytes again noted AC joint. Visualized right lung fields are clear.  Osteopenia. IMPRESSION: 1. Successful dislocation reduction of the right glenohumeral joint. 2. Moderate loss of the acromiohumeral space consistent with a rotator cuff tear, which is probably  chronic and degenerative. Electronically Signed   By: Telford Nab M.D.   On: 10/03/2022 01:01   DG Shoulder Right  Result Date: 10/02/2022 CLINICAL DATA:  Trauma. Patient reports stumped her toe and fell striking her right shoulder on the deck. EXAM: RIGHT SHOULDER - 2+ VIEW COMPARISON:  None Available. FINDINGS: The humeral head is dislocated anteroinferiorly. Small osseous fragment about the greater tuberosity suggesting Hill-Sachs deformity. Moderate acromioclavicular osteoarthritis. IMPRESSION: Anteroinferior dislocation of the humeral head. Electronically Signed   By: Keane Police D.O.   On: 10/02/2022 23:30     PROCEDURES:  Critical Care performed: Yes, see critical care procedure note(s)   CRITICAL CARE Performed by: Cyril Mourning Rachid Parham   Total critical care time: 30 minutes  Critical care time was  exclusive of separately billable procedures and treating other patients.  Critical care was necessary to treat or prevent imminent or life-threatening deterioration.  Critical care was time spent personally by me on the following activities: development of treatment plan with patient and/or surrogate as well as nursing, discussions with consultants, evaluation of patient's response to treatment, examination of patient, obtaining history from patient or surrogate, ordering and performing treatments and interventions, ordering and review of laboratory studies, ordering and review of radiographic studies, pulse oximetry and re-evaluation of patient's condition.   .Sedation  Date/Time: 10/03/2022 1:09 AM  Performed by: Jaythan Hinely, Delice Bison, DO Authorized by: Karron Alvizo, Delice Bison, DO   Consent:    Consent obtained:  Written   Consent given by:  Patient   Risks discussed:  Allergic reaction, dysrhythmia, inadequate sedation, nausea, vomiting, respiratory compromise necessitating ventilatory assistance and intubation, prolonged sedation necessitating reversal and prolonged hypoxia resulting in organ  damage   Alternatives discussed:  Analgesia without sedation Universal protocol:    Procedure explained and questions answered to patient or proxy's satisfaction: yes     Relevant documents present and verified: yes     Test results available: yes     Imaging studies available: yes     Required blood products, implants, devices, and special equipment available: yes     Site/side marked: yes     Immediately prior to procedure, a time out was called: yes     Patient identity confirmed:  Anonymous protocol, patient vented/unresponsive Indications:    Procedure performed:  Dislocation reduction   Procedure necessitating sedation performed by:  Physician performing sedation Pre-sedation assessment:    Time since last food or drink:  930 pm   ASA classification: class 2 - patient with mild systemic disease     Mouth opening:  2 finger widths   Thyromental distance:  3 finger widths   Mallampati score:  II - soft palate, uvula, fauces visible   Neck mobility: normal     Pre-sedation assessments completed and reviewed: airway patency, cardiovascular function, hydration status, mental status, nausea/vomiting, pain level, respiratory function and temperature     Pre-sedation assessment completed:  10/03/2022 12:30 AM Immediate pre-procedure details:    Reassessment: Patient reassessed immediately prior to procedure     Reviewed: vital signs, relevant labs/tests and NPO status     Verified: bag valve mask available, emergency equipment available, intubation equipment available, IV patency confirmed, oxygen available, reversal medications available and suction available   Procedure details (see MAR for exact dosages):    Preoxygenation:  Nasal cannula   Sedation:  Propofol   Intended level of sedation: deep   Analgesia:  Fentanyl   Intra-procedure monitoring:  Blood pressure monitoring, cardiac monitor, continuous pulse oximetry, continuous capnometry, frequent LOC assessments and frequent vital  sign checks   Intra-procedure events: none     Total Provider sedation time (minutes):  17 Post-procedure details:    Post-sedation assessment completed:  10/03/2022 1:30 AM   Attendance: Constant attendance by certified staff until patient recovered     Recovery: Patient returned to pre-procedure baseline     Post-sedation assessments completed and reviewed: airway patency, cardiovascular function, hydration status, mental status, nausea/vomiting, pain level, respiratory function and temperature     Patient is stable for discharge or admission: yes     Procedure completion:  Tolerated well, no immediate complications Reduction of dislocation  Date/Time: 10/03/2022 1:11 AM  Performed by: Murriel Holwerda, Delice Bison, DO Authorized by: Kinsey Karch, Delice Bison, DO  Consent:  Verbal consent obtained. Risks and benefits: risks, benefits and alternatives were discussed Consent given by: patient Patient understanding: patient states understanding of the procedure being performed Patient consent: the patient's understanding of the procedure matches consent given Procedure consent: procedure consent matches procedure scheduled Relevant documents: relevant documents present and verified Test results: test results available and properly labeled Site marked: the operative site was marked Imaging studies: imaging studies available Required items: required blood products, implants, devices, and special equipment available Patient identity confirmed: verbally with patient Time out: Immediately prior to procedure a "time out" was called to verify the correct patient, procedure, equipment, support staff and site/side marked as required. Local anesthesia used: no  Anesthesia: Local anesthesia used: no  Sedation: Patient sedated: yes Sedation type: moderate (conscious) sedation Sedatives: propofol Analgesia: fentanyl Sedation start date/time: 10/03/2022 12:37 AM Sedation end date/time: 10/03/2022 1:12 AM Vitals:  Vital signs were monitored during sedation.  Patient tolerance: patient tolerated the procedure well with no immediate complications       IMPRESSION / MDM / ASSESSMENT AND PLAN / ED COURSE  I reviewed the triage vital signs and the nursing notes.    Patient here after mechanical fall with right shoulder dislocation.  The patient is on the cardiac monitor to evaluate for evidence of arrhythmia and/or significant heart rate changes.   DIFFERENTIAL DIAGNOSIS (includes but not limited to):   Shoulder dislocation, fracture, contusion   Patient's presentation is most consistent with acute presentation with potential threat to life or bodily function.   PLAN: X-rays obtained from triage reviewed and interpreted by myself and the radiologist and show anterior inferior shoulder dislocation.  Will give pain medication.  She will need sedation.  Consented at bedside.  No other injuries on exam.  Neurovascular intact distally.   MEDICATIONS GIVEN IN ED: Medications  0.9 %  sodium chloride infusion ( Intravenous New Bag/Given 10/03/22 0055)  acetaminophen (TYLENOL) tablet 1,000 mg (1,000 mg Oral Given 10/02/22 2257)  fentaNYL (SUBLIMAZE) injection 50 mcg (50 mcg Intravenous Given 10/03/22 0028)  ondansetron (ZOFRAN) injection 4 mg (4 mg Intravenous Given 10/03/22 0027)  propofol (DIPRIVAN) 10 mg/mL bolus/IV push 36.1 mg (36.1 mg Intravenous Given 10/03/22 0037)  propofol (DIPRIVAN) 10 mg/mL bolus/IV push (50 mg Intravenous Given 10/03/22 0043)     ED COURSE: Patient sedated with propofol and shoulder successfully reduced.  Repeat x-ray reviewed and interpreted by myself and the radiologist.  Continues to be neurovascularly intact distally.  Anticipate discharge home in shoulder immobilizer with outpatient orthopedic follow-up and pain medication as needed.   At this time, I do not feel there is any life-threatening condition present. I reviewed all nursing notes, vitals, pertinent  previous records.  All lab and urine results, EKGs, imaging ordered have been independently reviewed and interpreted by myself.  I reviewed all available radiology reports from any imaging ordered this visit.  Based on my assessment, I feel the patient is safe to be discharged home without further emergent workup and can continue workup as an outpatient as needed. Discussed all findings, treatment plan as well as usual and customary return precautions.  They verbalize understanding and are comfortable with this plan.  Outpatient follow-up has been provided as needed.  All questions have been answered.    CONSULTS:  none   OUTSIDE RECORDS REVIEWED: Reviewed patient's last neurology note on 05/19/2018.       FINAL CLINICAL IMPRESSION(S) / ED DIAGNOSES   Final diagnoses:  Traumatic closed displaced fracture of right shoulder with  anterior dislocation, initial encounter     Rx / DC Orders   ED Discharge Orders          Ordered    HYDROcodone-acetaminophen (NORCO/VICODIN) 5-325 MG tablet  Every 6 hours PRN        10/03/22 0146    ondansetron (ZOFRAN-ODT) 4 MG disintegrating tablet  Every 6 hours PRN        10/03/22 0146             Note:  This document was prepared using Dragon voice recognition software and may include unintentional dictation errors.   Irisa Grimsley, Delice Bison, DO 10/03/22 [phone]

## 2022-10-03 NOTE — Discharge Instructions (Addendum)

## 2022-10-03 NOTE — Sedation Documentation (Signed)
Xr completed s/p right shoulder reduction, the pt has been placed in a shoulder immobilizer. The pt is awake and alert at this time, she denies any pain or discomfort, in no acute visible distress airway open and patent. Close monitoring continued.

## 2022-10-03 NOTE — ED Notes (Addendum)
Pt educated on the need to have her right shoulder reduced secondary to her right shoulder dislocation. The pt is agreeable with the plan at this time, the informed consent has been signed and witnessed by this RN.

## 2022-10-05 ENCOUNTER — Telehealth: Payer: Self-pay | Admitting: *Deleted

## 2022-10-05 NOTE — Patient Outreach (Signed)
  Care Coordination TOC Note Transition Care Management Follow-up Telephone Call Date of discharge and from [facility] [phone] How have you been since you were released from the hospital?I am not having any pain. I am not having to take any pain medication Any questions or concerns? No  Items Reviewed: Did the pt receive and understand the discharge instructions provided? No  Medications obtained and verified? Yes  patient is not requiring the Zofran or the Hydrocodone-Acetaminophen.  Other? No  Any new allergies since your discharge? No  Dietary orders reviewed? No Do you have support at home? Yes   Home Care and Equipment/Supplies: Were home health services ordered? not applicable If so, what is the name of the agency? N/a  Has the agency set up a time to come to the patient's home? not applicable Were any new equipment or medical supplies ordered?  No What is the name of the medical supply agency? N/a Were you able to get the supplies/equipment? not applicable Do you have any questions related to the use of the equipment or supplies? No  Functional Questionnaire: (I = Independent and D = Dependent) ADLs: I  Bathing/Dressing- I  Meal Prep- I  Eating- I  Maintaining continence- I  Transferring/Ambulation- I  Managing Meds- I  Follow up appointments reviewed:  PCP Hospital f/u appt confirmed? No   Specialist Hospital f/u appt confirmed? Yes  Scheduled to see Orthopedic surgery [phone] at 10:15 Are transportation arrangements needed? No  If their condition worsens, is the pt aware to call PCP or go to the Emergency Dept.? Yes Was the patient provided with contact information for the PCP's office or ED? Yes Was to pt encouraged to call back with questions or concerns? Yes  SDOH assessments and interventions completed:   Yes SDOH Interventions Today    Flowsheet Row Most Recent Value  SDOH Interventions   Food Insecurity Interventions Intervention Not Indicated   Housing Interventions Intervention Not Indicated  Transportation Interventions Intervention Not Indicated       Care Coordination Interventions:  No Care Coordination interventions needed at this time.   Encounter Outcome:  Pt. Visit Completed    [facility] [phone]

## 2022-10-08 DIAGNOSIS — S43014A Anterior dislocation of right humerus, initial encounter: Secondary | ICD-10-CM | POA: Diagnosis not present

## 2022-10-08 DIAGNOSIS — M25511 Pain in right shoulder: Secondary | ICD-10-CM | POA: Diagnosis not present

## 2022-10-22 ENCOUNTER — Other Ambulatory Visit: Payer: Self-pay | Admitting: Orthopedic Surgery

## 2022-10-22 DIAGNOSIS — M25511 Pain in right shoulder: Secondary | ICD-10-CM | POA: Diagnosis not present

## 2022-10-22 DIAGNOSIS — S43014D Anterior dislocation of right humerus, subsequent encounter: Secondary | ICD-10-CM

## 2022-10-26 ENCOUNTER — Ambulatory Visit
Admission: RE | Admit: 2022-10-26 | Discharge: 2022-10-26 | Disposition: A | Discharge status: Home / Self Care | Payer: 59 | Source: Ambulatory Visit | Attending: Orthopedic Surgery | Admitting: Orthopedic Surgery

## 2022-10-26 DIAGNOSIS — S43014D Anterior dislocation of right humerus, subsequent encounter: Secondary | ICD-10-CM

## 2022-10-26 DIAGNOSIS — M25511 Pain in right shoulder: Secondary | ICD-10-CM | POA: Diagnosis not present

## 2022-11-24 DIAGNOSIS — S43014A Anterior dislocation of right humerus, initial encounter: Secondary | ICD-10-CM | POA: Diagnosis not present

## 2022-11-24 DIAGNOSIS — M75121 Complete rotator cuff tear or rupture of right shoulder, not specified as traumatic: Secondary | ICD-10-CM | POA: Diagnosis not present

## 2022-11-25 ENCOUNTER — Ambulatory Visit (INDEPENDENT_AMBULATORY_CARE_PROVIDER_SITE_OTHER): Payer: 59 | Admitting: Internal Medicine

## 2022-11-25 ENCOUNTER — Other Ambulatory Visit: Payer: Self-pay | Admitting: Orthopedic Surgery

## 2022-11-25 ENCOUNTER — Encounter: Payer: Self-pay | Admitting: Internal Medicine

## 2022-11-25 VITALS — BP 124/80 | HR 98 | Temp 97.7°F | Resp 16 | Ht 64.5 in | Wt 158.5 lb

## 2022-11-25 DIAGNOSIS — F339 Major depressive disorder, recurrent, unspecified: Secondary | ICD-10-CM

## 2022-11-25 DIAGNOSIS — Z23 Encounter for immunization: Secondary | ICD-10-CM | POA: Diagnosis not present

## 2022-11-25 DIAGNOSIS — I10 Essential (primary) hypertension: Secondary | ICD-10-CM | POA: Diagnosis not present

## 2022-11-25 DIAGNOSIS — Z13820 Encounter for screening for osteoporosis: Secondary | ICD-10-CM

## 2022-11-25 DIAGNOSIS — E7800 Pure hypercholesterolemia, unspecified: Secondary | ICD-10-CM

## 2022-11-25 DIAGNOSIS — K219 Gastro-esophageal reflux disease without esophagitis: Secondary | ICD-10-CM | POA: Diagnosis not present

## 2022-11-25 DIAGNOSIS — I480 Paroxysmal atrial fibrillation: Secondary | ICD-10-CM

## 2022-11-25 DIAGNOSIS — Z1231 Encounter for screening mammogram for malignant neoplasm of breast: Secondary | ICD-10-CM | POA: Diagnosis not present

## 2022-11-25 DIAGNOSIS — S41001A Unspecified open wound of right shoulder, initial encounter: Secondary | ICD-10-CM

## 2022-11-25 DIAGNOSIS — E2839 Other primary ovarian failure: Secondary | ICD-10-CM

## 2022-11-25 NOTE — Patient Instructions (Signed)
It was great seeing you today!  Plan discussed at today's visit: -Mammogram and bone scan ordered today, call number on the card to call and schedule -Prevnar 20 vaccine today -Please plan for fasting labs at follow up  Follow up in: 6 months   Take care and let us know if you have any questions or concerns prior to your next visit.  Dr. Rosana Berger

## 2022-11-25 NOTE — Progress Notes (Signed)
New Patient Office Visit  Subjective    Patient ID: Lisa Caldwell, female    DOB: 1945-12-24  Age: 77 y.o. MRN: [id number]  CC:  Chief Complaint  Patient presents with   Establish Care    Specialist-cardio    HPI [facility] presents to establish care.  Hypertension/A.Fib: -Medications: Metoprolol XL 25 mg BID, Eliquis 5 mg BID -Patient is compliant with above medications and reports no side effects. -Denies any SOB, CP, vision changes, LE edema or symptoms of hypotension  HLD: -Medications: Zocor 10 mg -Patient is compliant with above medications and reports no side effects.  -Last lipid panel: Lipid Panel     Component Value Date/Time   CHOL 195 07/23/2022 0934   CHOL 201 (H) 06/12/2021 1052   TRIG 79 07/23/2022 0934   HDL 64 07/23/2022 0934   HDL 77 06/12/2021 1052   CHOLHDL 3.0 07/23/2022 0934   LDLCALC 113 (H) 07/23/2022 0934   LABVLDL 14 06/12/2021 1052   MDD: -Mood status: stable -Current treatment: Celexa 20 mg -Satisfied with current treatment?: no -Duration of current treatment : years -Side effects: no Medication compliance: excellent compliance     11/25/2022   10:52 AM 07/20/2022    2:56 PM 03/16/2022    2:15 PM 03/16/2022    2:14 PM 11/16/2021    2:10 PM  Depression screen PHQ 2/9  Decreased Interest 0 0 0 0 0  Down, Depressed, Hopeless 0 0 0 0 0  PHQ - 2 Score 0 0 0 0 0  Altered sleeping 0 0 0  0  Tired, decreased energy 0 0 0  0  Change in appetite 0 0 0  0  Feeling bad or failure about yourself  0 0 0  0  Trouble concentrating 0 0 0  0  Moving slowly or fidgety/restless 0 0 0  0  Suicidal thoughts 0 0 0  0  PHQ-9 Score 0 0 0  0  Difficult doing work/chores Not difficult at all Not difficult at all Not difficult at all  Not difficult at all   GERD: -Currently on Prilosec 20 mg, taking daily but feels like she does not need it daily   Vitamin D/Vitamin B12 Deficiency: -Currently on Vitamin B12 1000 mcg daily, Vitamin D daily,  uncertain dose  History of Colon Cancer:  -Diagnosed with cancerous colon polyp in 2008, underwent partial colectomy at the time. Did not need radiation/chemotherapy.  -Colonoscopy in 2022 negative, was told she did not need further screening  Health Maintenance: -Blood work UTD -Mammogram 7/23 Birads-1, will be du along with DEXA in the summer -Prevnar 20 due  Outpatient Encounter Medications as of 11/25/2022  Medication Sig   Cholecalciferol (VITAMIN D3 PO) Take by mouth daily.    citalopram (CELEXA) 20 MG tablet Take 1 tablet (20 mg total) by mouth daily.   fluticasone (FLONASE) 50 MCG/ACT nasal spray USE 2 SPRAYS IN BOTH  NOSTRILS DAILY   metoprolol succinate (TOPROL-XL) 25 MG 24 hr tablet Take 2 tablets (50 mg total) by mouth daily.   Multiple Vitamin (MULTIVITAMIN) tablet Take 1 tablet by mouth daily.   omeprazole (PRILOSEC) 20 MG capsule Take 1 capsule (20 mg total) by mouth daily.   simvastatin (ZOCOR) 10 MG tablet Take 1 tablet (10 mg total) by mouth at bedtime.   vitamin B-12 (CYANOCOBALAMIN) 1000 MCG tablet Take 1 tablet (1,000 mcg total) by mouth daily.   apixaban (ELIQUIS) 5 MG TABS tablet Take 1 tablet (5 mg  total) by mouth 2 (two) times daily.   [DISCONTINUED] HYDROcodone-acetaminophen (NORCO/VICODIN) 5-325 MG tablet Take 1 tablet by mouth every 6 (six) hours as needed.   [DISCONTINUED] ondansetron (ZOFRAN-ODT) 4 MG disintegrating tablet Take 1 tablet (4 mg total) by mouth every 6 (six) hours as needed for nausea or vomiting.   No facility-administered encounter medications on file as of 11/25/2022.    Past Medical History:  Diagnosis Date   Allergy    Anxiety    Cancer ([place])    colon cancer   Depression    GERD (gastroesophageal reflux disease)    Headache(784.0)    Hyperlipidemia    Hypertension    Inflammatory polyps of colon with rectal bleeding ([place]) 2008    rt colectomy for a polyp with carcinoma in situ    Personal history of malignant neoplasm of large  intestine     Past Surgical History:  Procedure Laterality Date   [place] Right 2008   COLONOSCOPY  2012   Dr. Jamal Collin   COLONOSCOPY WITH PROPOFOL N/A 03/10/2016   Procedure: COLONOSCOPY WITH PROPOFOL;  Surgeon: [name], MD;  Location: [facility] ENDOSCOPY;  Service: Endoscopy;  Laterality: N/A;   COLONOSCOPY WITH PROPOFOL N/A 03/09/2021   Procedure: COLONOSCOPY WITH PROPOFOL;  Surgeon: Jonathon Bellows, MD;  Location: [facility] ENDOSCOPY;  Service: Gastroenterology;  Laterality: N/A;   FOOT SURGERY Left 2012   FOOT SURGERY Left 2013   THYROGLOSSAL DUCT CYST N/A 09/04/2015   Procedure: THYROGLOSSAL DUCT CYST;  Surgeon: Carloyn Manner, MD;  Location: [facility] ORS;  Service: ENT;  Laterality: N/A;    Family History  Problem Relation Age of Onset   Heart disease Mother    Heart disease Father    Drug abuse Brother    Breast cancer Neg Hx     Social History   Socioeconomic History   Marital status: Married    Spouse name: Tom   Number of children: 2   Years of education: Not on file   Highest education level: 12th grade  Occupational History   Occupation: Retired  Tobacco Use   Smoking status: Never   Smokeless tobacco: Never  Vaping Use   Vaping Use: Never used  Substance and Sexual Activity   Alcohol use: Yes    Comment: 2 a day   Drug use: No   Sexual activity: Not Currently    Birth control/protection: Other-see comments    Comment: hysterectomy  Other Topics Concern   Not on file  Social History Narrative   Not on file   Social Determinants of Health   Financial Resource Strain: Low Risk  (10/23/2021)   Overall Financial Resource Strain (CARDIA)    Difficulty of Paying Living Expenses: Not hard at all  Food Insecurity: No Food Insecurity (10/05/2022)   Hunger Vital Sign    Worried About Running Out of Food in the Last Year: Never true    [place] in the Last Year: Never true  Transportation Needs: Unknown  (10/05/2022)   PRAPARE - Hydrologist (Medical): No    Lack of Transportation (Non-Medical): Not on file  Physical Activity: Sufficiently Active (10/23/2021)   Exercise Vital Sign    Days of Exercise per Week: 5 days    Minutes of Exercise per Session: 40 min  Stress: No Stress Concern Present (10/23/2021)   [place]  Feeling of Stress : Not at all  Social Connections: Moderately Isolated (10/23/2021)   Social Connection and Isolation Panel [NHANES]    Frequency of Communication with Friends and Family: More than three times a week    Frequency of Social Gatherings with Friends and Family: More than three times a week    Attends Religious Services: Never    Marine scientist or Organizations: No    Attends Archivist Meetings: Never    Marital Status: Married  Human resources officer Violence: Not At Risk (10/23/2021)   Humiliation, Afraid, Rape, and Kick questionnaire    Fear of Current or Ex-Partner: No    Emotionally Abused: No    Physically Abused: No    Sexually Abused: No    Review of Systems  Constitutional:  Negative for chills and fever.  Eyes:  Negative for blurred vision.  Respiratory:  Negative for shortness of breath.   Cardiovascular:  Negative for chest pain.  Gastrointestinal:  Negative for abdominal pain, blood in stool and melena.  Genitourinary:  Negative for hematuria.      Objective    BP 124/80   Pulse 98   Temp 97.7 F (36.5 C)   Resp 16   Ht 5' 4.5" (1.638 m)   Wt 158 lb 8 oz (71.9 kg)   SpO2 99%   BMI 26.79 kg/m   Physical Exam Constitutional:      Appearance: Normal appearance.  HENT:     Head: Normocephalic and atraumatic.     Mouth/Throat:     Mouth: Mucous membranes are moist.     Pharynx: Oropharynx is clear.  Eyes:     Extraocular Movements: Extraocular movements intact.     Conjunctiva/sclera: Conjunctivae normal.     Pupils:  Pupils are equal, round, and reactive to light.  Neck:     Comments: No thyromegaly  Cardiovascular:     Rate and Rhythm: Normal rate and regular rhythm.  Pulmonary:     Effort: Pulmonary effort is normal.     Breath sounds: Normal breath sounds.  Musculoskeletal:     Cervical back: No tenderness.     Right lower leg: No edema.     Left lower leg: No edema.  Lymphadenopathy:     Cervical: No cervical adenopathy.  Skin:    General: Skin is warm and dry.  Neurological:     General: No focal deficit present.     Mental Status: She is alert. Mental status is at baseline.  Psychiatric:        Mood and Affect: Mood normal.        Behavior: Behavior normal.         Assessment & Plan:   1. Hypertension, unspecified type/Paroxysmal atrial fibrillation ([place]): Chronic, BP at goal today. A. Fib diagnosis relatively new but doing well on Eliquis 5 mg BID, Metoprolol 50 mg.   2. Hypercholesterolemia: Stable, reviewed labs from October. Doing well on Zocor 10 mg.   3. Episode of recurrent major depressive disorder, unspecified depression episode severity ([place]): Stable, has been on Celexa 20 mg for years, doing well.   4. Gastroesophageal reflux disease without esophagitis: Chronic and stable, has been taking Prilosec 20 mg daily but will start it as needed.   6. Encounter for screening mammogram for malignant neoplasm of breast/Osteoporosis screening/Estrogen deficiency: Mammogram and DEXA ordered for the summer.  - MM Digital Screening; Future - DG Bone Density; Future  7. Vaccine for streptococcus pneumoniae and influenza: Prevnar 20  administered today.   - Pneumococcal conjugate vaccine 20-valent (Prevnar 20)   Return in 6 months (on 05/26/2023) for 6 month medical follow up, needs AWV.   Teodora Medici, DO

## 2022-11-26 ENCOUNTER — Other Ambulatory Visit: Payer: Self-pay | Admitting: Internal Medicine

## 2022-11-26 DIAGNOSIS — Z1231 Encounter for screening mammogram for malignant neoplasm of breast: Secondary | ICD-10-CM

## 2022-12-02 ENCOUNTER — Ambulatory Visit
Admission: RE | Admit: 2022-12-02 | Discharge: 2022-12-02 | Disposition: A | Discharge status: Home / Self Care | Payer: 59 | Source: Ambulatory Visit | Attending: Orthopedic Surgery | Admitting: Orthopedic Surgery

## 2022-12-02 DIAGNOSIS — M19011 Primary osteoarthritis, right shoulder: Secondary | ICD-10-CM | POA: Diagnosis not present

## 2022-12-02 DIAGNOSIS — S41001A Unspecified open wound of right shoulder, initial encounter: Secondary | ICD-10-CM

## 2022-12-02 DIAGNOSIS — M25411 Effusion, right shoulder: Secondary | ICD-10-CM | POA: Diagnosis not present

## 2022-12-02 DIAGNOSIS — Z01818 Encounter for other preprocedural examination: Secondary | ICD-10-CM | POA: Diagnosis not present

## 2022-12-02 DIAGNOSIS — S43014A Anterior dislocation of right humerus, initial encounter: Secondary | ICD-10-CM | POA: Insufficient documentation

## 2022-12-02 DIAGNOSIS — M75101 Unspecified rotator cuff tear or rupture of right shoulder, not specified as traumatic: Secondary | ICD-10-CM | POA: Diagnosis not present

## 2022-12-09 ENCOUNTER — Other Ambulatory Visit: Payer: Self-pay | Admitting: Orthopedic Surgery

## 2022-12-16 ENCOUNTER — Encounter (HOSPITAL_COMMUNITY): Payer: Self-pay | Admitting: Urgent Care

## 2022-12-16 ENCOUNTER — Encounter
Admission: RE | Admit: 2022-12-16 | Discharge: 2022-12-16 | Disposition: A | Discharge status: Home / Self Care | Payer: 59 | Source: Ambulatory Visit | Attending: Orthopedic Surgery | Admitting: Orthopedic Surgery

## 2022-12-16 ENCOUNTER — Other Ambulatory Visit: Payer: Self-pay

## 2022-12-16 VITALS — BP 123/69 | HR 77 | Resp 16 | Ht 64.5 in | Wt 160.7 lb

## 2022-12-16 DIAGNOSIS — Z01818 Encounter for other preprocedural examination: Secondary | ICD-10-CM | POA: Insufficient documentation

## 2022-12-16 DIAGNOSIS — Z01812 Encounter for preprocedural laboratory examination: Secondary | ICD-10-CM

## 2022-12-16 HISTORY — DX: Other specified postprocedural states: Z98.890

## 2022-12-16 HISTORY — DX: Other specified postprocedural states: R11.2

## 2022-12-16 HISTORY — DX: Cardiac arrhythmia, unspecified: I49.9

## 2022-12-16 HISTORY — DX: Unspecified osteoarthritis, unspecified site: M19.90

## 2022-12-16 LAB — CBC WITH DIFFERENTIAL/PLATELET
Abs Immature Granulocytes: 0.02 10*3/uL (ref 0.00–0.07)
Basophils Absolute: 0.1 10*3/uL (ref 0.0–0.1)
Basophils Relative: 1 %
Eosinophils Absolute: 0.1 10*3/uL (ref 0.0–0.5)
Eosinophils Relative: 2 %
HCT: 40.2 % (ref 36.0–46.0)
Hemoglobin: 13.2 g/dL (ref 12.0–15.0)
Immature Granulocytes: 0 %
Lymphocytes Relative: 21 %
Lymphs Abs: 1.4 10*3/uL (ref 0.7–4.0)
MCH: 32.5 pg (ref 26.0–34.0)
MCHC: 32.8 g/dL (ref 30.0–36.0)
MCV: 99 fL (ref 80.0–100.0)
Monocytes Absolute: 0.4 10*3/uL (ref 0.1–1.0)
Monocytes Relative: 6 %
Neutro Abs: 4.8 10*3/uL (ref 1.7–7.7)
Neutrophils Relative %: 70 %
Platelets: 257 10*3/uL (ref 150–400)
RBC: 4.06 MIL/uL (ref 3.87–5.11)
RDW: 12.3 % (ref 11.5–15.5)
WBC: 6.8 10*3/uL (ref 4.0–10.5)
nRBC: 0 % (ref 0.0–0.2)

## 2022-12-16 LAB — URINALYSIS, ROUTINE W REFLEX MICROSCOPIC
Bacteria, UA: NONE SEEN
Bilirubin Urine: NEGATIVE
Glucose, UA: NEGATIVE mg/dL
Hgb urine dipstick: NEGATIVE
Ketones, ur: NEGATIVE mg/dL
Nitrite: NEGATIVE
Protein, ur: NEGATIVE mg/dL
Specific Gravity, Urine: 1.02 (ref 1.005–1.030)
pH: 5 (ref 5.0–8.0)

## 2022-12-16 LAB — COMPREHENSIVE METABOLIC PANEL
ALT: 19 U/L (ref 0–44)
AST: 26 U/L (ref 15–41)
Albumin: 3.7 g/dL (ref 3.5–5.0)
Alkaline Phosphatase: 69 U/L (ref 38–126)
Anion gap: 8 (ref 5–15)
BUN: 17 mg/dL (ref 8–23)
CO2: 27 mmol/L (ref 22–32)
Calcium: 9.1 mg/dL (ref 8.9–10.3)
Chloride: 104 mmol/L (ref 98–111)
Creatinine, Ser: 0.88 mg/dL (ref 0.44–1.00)
GFR, Estimated: >60 mL/min (ref >60)
Glucose, Bld: 133 mg/dL — ABNORMAL HIGH (ref 70–99)
Potassium: 3.6 mmol/L (ref 3.5–5.1)
Sodium: 139 mmol/L (ref 135–145)
Total Bilirubin: 0.8 mg/dL (ref 0.3–1.2)
Total Protein: 7.3 g/dL (ref 6.5–8.1)

## 2022-12-16 LAB — TYPE AND SCREEN
ABO/RH(D): A POS
Antibody Screen: NEGATIVE

## 2022-12-16 LAB — SURGICAL PCR SCREEN
MRSA, PCR: NEGATIVE
Staphylococcus aureus: NEGATIVE

## 2022-12-16 NOTE — Patient Instructions (Addendum)
Your procedure is scheduled on: 12/28/22 - TUESDAY Report to the Registration Desk on the 1st floor of the [facility]. To find out your arrival time, please call [phone] between 1PM - 3PM on: 12/27/22 - MONDAY If your arrival time is 6:00 am, do not arrive before that time as the [facility] entrance doors do not open until 6:00 am.  REMEMBER: Instructions that are not followed completely may result in serious medical risk, up to and including death; or upon the discretion of your surgeon and anesthesiologist your surgery may need to be rescheduled.  Do not eat food after midnight the night before surgery.  No gum chewing or hard candies.  You may however, drink CLEAR liquids up to 2 hours before you are scheduled to arrive for your surgery. Do not drink anything within 2 hours of your scheduled arrival time.  Clear liquids include: - water  - apple juice without pulp - gatorade (not RED colors) - black coffee or tea (Do NOT add milk or creamers to the coffee or tea) Do NOT drink anything that is not on this list.  In addition, your doctor has ordered for you to drink the provided:  Ensure Pre-Surgery Clear Carbohydrate Drink  Drinking this carbohydrate drink up to two hours before surgery helps to reduce insulin resistance and improve patient outcomes. Please complete drinking 2 hours before scheduled arrival time.  One week prior to surgery:Beginning 12/21/22 :  Stop Anti-inflammatories (NSAIDS) such as Advil, Aleve, Ibuprofen, Motrin, Naproxen, Naprosyn and Aspirin based products such as Excedrin, Goody's Powder, BC Powder.  Stop BEGINNING 12/21/22, ANY OVER THE COUNTER supplements until after surgery.Cholecalciferol (VITAMIN D3), MULTIVITAMIN ,   You may take Tylenol if needed for pain up until the day of surgery.  Continue taking all prescribed medications with the exception of the following:  apixaban (ELIQUIS) HOLD BEGINNING 12/25/22.  TAKE ONLY THESE MEDICATIONS  THE MORNING OF SURGERY WITH A SIP OF WATER:  omeprazole (PRILOSEC) - (take one the night before and one on the morning of surgery - helps to prevent nausea after surgery.) metoprolol succinate (TOPROL-XL)    No Alcohol for 24 hours before or after surgery.  No Smoking including e-cigarettes for 24 hours before surgery.  No chewable tobacco products for at least 6 hours before surgery.  No nicotine patches on the day of surgery.  Do not use any "recreational" drugs for at least a week (preferably 2 weeks) before your surgery.  Please be advised that the combination of cocaine and anesthesia may have negative outcomes, up to and including death. If you test positive for cocaine, your surgery will be cancelled.  On the morning of surgery brush your teeth with toothpaste and water, you may rinse your mouth with mouthwash if you wish. Do not swallow any toothpaste or mouthwash.  Use CHG Soap or wipes as directed on instruction sheet.  Do not wear jewelry, make-up, hairpins, clips or nail polish.  Do not wear lotions, powders, or perfumes.   Do not shave body hair from the neck down 48 hours before surgery.  Contact lenses, hearing aids and dentures may not be worn into surgery.  Do not bring valuables to the hospital. [facility] is not responsible for any missing/lost belongings or valuables.   Total Shoulder Arthroplasty:  use Benzoyl Peroxide 5% Gel as directed on instruction sheet.  Notify your doctor if there is any change in your medical condition (cold, fever, infection).  Wear comfortable clothing (specific to your  surgery type) to the hospital.  After surgery, you can help prevent lung complications by doing breathing exercises.  Take deep breaths and cough every 1-2 hours. Your doctor may order a device called an Incentive Spirometer to help you take deep breaths. When coughing or sneezing, hold a pillow firmly against your incision with both hands. This is called  "splinting." Doing this helps protect your incision. It also decreases belly discomfort.  If you are being admitted to the hospital overnight, leave your suitcase in the car. After surgery it may be brought to your room.  In case of increased patient census, it may be necessary for you, the patient, to continue your postoperative care in the Same Day Surgery department.  If you are being discharged the day of surgery, you will not be allowed to drive home. You will need a responsible individual to drive you home and stay with you for 24 hours after surgery.   If you are taking public transportation, you will need to have a responsible individual with you.  Please call the [facility] at [phone] if you have any questions about these instructions.  Surgery Visitation Policy:  Patients undergoing a surgery or procedure may have two family members or support persons with them as long as the person is not COVID-19 positive or experiencing its symptoms.   Inpatient Visitation:    Visiting hours are 7 a.m. to 8 p.m. Up to four visitors are allowed at one time in a patient room. The visitors may rotate out with other people during the day. One designated support person (adult) may remain overnight.  Due to an increase in RSV and influenza rates and associated hospitalizations, children ages 31 and under will not be able to visit patients in [facility]. Masks continue to be strongly recommended.   Preparing for Total Shoulder Arthroplasty  Before surgery, you can play an important role by reducing the number of germs on your skin by using the following products:  Benzoyl Peroxide Gel  o Reduces the number of germs present on the skin  o Applied twice a day to shoulder area starting two days before surgery  Chlorhexidine Gluconate (CHG) Soap  o An antiseptic cleaner that kills germs and bonds with the skin to continue killing germs even after  washing  o Used for showering the night before surgery and morning of surgery  BENZOYL PEROXIDE 5% GEL  Please do not use if you have an allergy to benzoyl peroxide. If your skin becomes reddened/irritated stop using the benzoyl peroxide.  Starting two days before surgery, apply as follows:  1. Apply benzoyl peroxide in the morning and at night. Apply after taking a shower. If you are not taking a shower, clean entire shoulder front, back, and side along with the armpit with a clean wet washcloth.  2. Place a quarter-sized dollop on your shoulder and rub in thoroughly, making sure to cover the front, back, and side of your shoulder, along with the armpit.  2 days before ____ AM ____ PM 1 day before ____ AM ____ PM  3. Do this twice a day for two days. (Last application is the night before surgery, AFTER using the CHG soap).  4. Do NOT apply benzoyl peroxide gel on the day of surgery.     Preparing for Surgery with CHLORHEXIDINE GLUCONATE (CHG) Soap  Chlorhexidine Gluconate (CHG) Soap  o An antiseptic cleaner that kills germs and bonds with the skin to continue killing germs  even after washing  o Used for showering the night before surgery and morning of surgery  Before surgery, you can play an important role by reducing the number of germs on your skin.  CHG (Chlorhexidine gluconate) soap is an antiseptic cleanser which kills germs and bonds with the skin to continue killing germs even after washing.  Please do not use if you have an allergy to CHG or antibacterial soaps. If your skin becomes reddened/irritated stop using the CHG.  1. Shower the NIGHT BEFORE SURGERY and the MORNING OF SURGERY with CHG soap.  2. If you choose to wash your hair, wash your hair first as usual with your normal shampoo.  3. After shampooing, rinse your hair and body thoroughly to remove the shampoo.  4. Use CHG as you would any other liquid soap. You can apply CHG directly to the skin and wash  gently with a scrungie or a clean washcloth.  5. Apply the CHG soap to your body only from the neck down. Do not use on open wounds or open sores. Avoid contact with your eyes, ears, mouth, and genitals (private parts). Wash face and genitals (private parts) with your normal soap.  6. Wash thoroughly, paying special attention to the area where your surgery will be performed.  7. Thoroughly rinse your body with warm water.  8. Do not shower/wash with your normal soap after using and rinsing off the CHG soap.  9. Pat yourself dry with a clean towel.  10. Wear clean pajamas to bed the night before surgery.  12. Place clean sheets on your bed the night of your first shower and do not sleep with pets.  13. Shower again with the CHG soap on the day of surgery prior to arriving at the hospital.  14. Do not apply any deodorants/lotions/powders.  15. Please wear clean clothes to the hospital.  How to Use an Incentive Spirometer  An incentive spirometer is a tool that measures how well you are filling your lungs with each breath. Learning to take long, deep breaths using this tool can help you keep your lungs clear and active. This may help to reverse or lessen your chance of developing breathing (pulmonary) problems, especially infection. You may be asked to use a spirometer: After a surgery. If you have a lung problem or a history of smoking. After a long period of time when you have been unable to move or be active. If the spirometer includes an indicator to show the highest number that you have reached, your health care provider or respiratory therapist will help you set a goal. Keep a log of your progress as told by your health care provider. What are the risks? Breathing too quickly may cause dizziness or cause you to pass out. Take your time so you do not get dizzy or light-headed. If you are in pain, you may need to take pain medicine before doing incentive spirometry. It is harder to  take a deep breath if you are having pain. How to use your incentive spirometer  Sit up on the edge of your bed or on a chair. Hold the incentive spirometer so that it is in an upright position. Before you use the spirometer, breathe out normally. Place the mouthpiece in your mouth. Make sure your lips are closed tightly around it. Breathe in slowly and as deeply as you can through your mouth, causing the piston or the ball to rise toward the top of the chamber. Hold your breath  for 3-5 seconds, or for as long as possible. If the spirometer includes a coach indicator, use this to guide you in breathing. Slow down your breathing if the indicator goes above the marked areas. Remove the mouthpiece from your mouth and breathe out normally. The piston or ball will return to the bottom of the chamber. Rest for a few seconds, then repeat the steps 10 or more times. Take your time and take a few normal breaths between deep breaths so that you do not get dizzy or light-headed. Do this every 1-2 hours when you are awake. If the spirometer includes a goal marker to show the highest number you have reached (best effort), use this as a goal to work toward during each repetition. After each set of 10 deep breaths, cough a few times. This will help to make sure that your lungs are clear. If you have an incision on your chest or abdomen from surgery, place a pillow or a rolled-up towel firmly against the incision when you cough. This can help to reduce pain while taking deep breaths and coughing. General tips When you are able to get out of bed: Walk around often. Continue to take deep breaths and cough in order to clear your lungs. Keep using the incentive spirometer until your health care provider says it is okay to stop using it. If you have been in the hospital, you may be told to keep using the spirometer at home. Contact a health care provider if: You are having difficulty using the spirometer. You  have trouble using the spirometer as often as instructed. Your pain medicine is not giving enough relief for you to use the spirometer as told. You have a fever. Get help right away if: You develop shortness of breath. You develop a cough with bloody mucus from the lungs. You have fluid or blood coming from an incision site after you cough. Summary An incentive spirometer is a tool that can help you learn to take long, deep breaths to keep your lungs clear and active. You may be asked to use a spirometer after a surgery, if you have a lung problem or a history of smoking, or if you have been inactive for a long period of time. Use your incentive spirometer as instructed every 1-2 hours while you are awake. If you have an incision on your chest or abdomen, place a pillow or a rolled-up towel firmly against your incision when you cough. This will help to reduce pain. Get help right away if you have shortness of breath, you cough up bloody mucus, or blood comes from your incision when you cough. This information is not intended to replace advice given to you by your health care provider. Make sure you discuss any questions you have with your health care provider.

## 2022-12-22 DIAGNOSIS — M75121 Complete rotator cuff tear or rupture of right shoulder, not specified as traumatic: Secondary | ICD-10-CM | POA: Diagnosis not present

## 2022-12-28 ENCOUNTER — Ambulatory Visit: Admission: RE | Admit: 2022-12-28 | Payer: 59 | Source: Home / Self Care | Admitting: Orthopedic Surgery

## 2022-12-28 ENCOUNTER — Encounter: Admission: RE | Payer: Self-pay | Source: Home / Self Care

## 2022-12-28 SURGERY — ARTHROPLASTY, SHOULDER, TOTAL, REVERSE
Anesthesia: Choice | Site: Shoulder | Laterality: Right

## 2023-02-02 ENCOUNTER — Other Ambulatory Visit: Payer: Self-pay

## 2023-02-02 DIAGNOSIS — E7800 Pure hypercholesterolemia, unspecified: Secondary | ICD-10-CM

## 2023-02-02 DIAGNOSIS — K219 Gastro-esophageal reflux disease without esophagitis: Secondary | ICD-10-CM

## 2023-02-02 DIAGNOSIS — F3342 Major depressive disorder, recurrent, in full remission: Secondary | ICD-10-CM

## 2023-02-02 DIAGNOSIS — J309 Allergic rhinitis, unspecified: Secondary | ICD-10-CM

## 2023-02-02 DIAGNOSIS — I480 Paroxysmal atrial fibrillation: Secondary | ICD-10-CM

## 2023-02-02 MED ORDER — FLUTICASONE PROPIONATE 50 MCG/ACT NA SUSP: NASAL | 3 refills | Status: DC

## 2023-02-02 MED ORDER — METOPROLOL SUCCINATE ER 25 MG PO TB24: 50.0000 mg | ORAL_TABLET | Freq: Every day | ORAL | 3 refills | Status: DC

## 2023-02-02 MED ORDER — APIXABAN 5 MG PO TABS: 5.0000 mg | ORAL_TABLET | Freq: Two times a day (BID) | ORAL | 3 refills | Status: DC

## 2023-02-02 MED ORDER — OMEPRAZOLE 20 MG PO CPDR: 20.0000 mg | DELAYED_RELEASE_CAPSULE | Freq: Every day | ORAL | 3 refills | Status: DC

## 2023-02-02 MED ORDER — SIMVASTATIN 10 MG PO TABS: 10.0000 mg | ORAL_TABLET | Freq: Every day | ORAL | 3 refills | Status: DC

## 2023-02-04 ENCOUNTER — Other Ambulatory Visit: Payer: Self-pay

## 2023-02-04 DIAGNOSIS — F3342 Major depressive disorder, recurrent, in full remission: Secondary | ICD-10-CM

## 2023-02-04 MED ORDER — CITALOPRAM HYDROBROMIDE 20 MG PO TABS: 20.0000 mg | ORAL_TABLET | Freq: Every day | ORAL | 3 refills | Status: DC

## 2023-02-04 NOTE — Telephone Encounter (Signed)
Pt called saying she poke to [facility] and they told her they have not heard back about these medication refills.  CB@  [phone]

## 2023-02-24 ENCOUNTER — Ambulatory Visit (INDEPENDENT_AMBULATORY_CARE_PROVIDER_SITE_OTHER): Payer: 59

## 2023-02-24 VITALS — BP 122/74 | Ht 64.5 in | Wt 159.9 lb

## 2023-02-24 DIAGNOSIS — Z0000 Encounter for general adult medical examination without abnormal findings: Secondary | ICD-10-CM | POA: Diagnosis not present

## 2023-02-24 NOTE — Patient Instructions (Signed)
Lisa Caldwell , Thank you for taking time to come for your Medicare Wellness Visit. I appreciate your ongoing commitment to your health goals. Please review the following plan we discussed and let me know if I can assist you in the future.   These are the goals we discussed:  Goals      Prevent falls     Recommend to remove any items from the home that may cause slips or trips.     Reduce alcohol intake to 1 serving a day.         This is a list of the screening recommended for you and due dates:  Health Maintenance  Topic Date Due   COVID-19 Vaccine (6 - 2023-24 season) 06/18/2022   Mammogram  04/20/2023   Flu Shot  05/19/2023   Medicare Annual Wellness Visit  02/24/2024   Colon Cancer Screening  03/09/2026   DEXA scan (bone density measurement)  06/25/2026   Pneumonia Vaccine  Completed   Hepatitis C Screening: USPSTF Recommendation to screen - Ages 24-79 yo.  Completed   Zoster (Shingles) Vaccine  Completed   HPV Vaccine  Aged Out   DTaP/Tdap/Td vaccine  Discontinued    Advanced directives: yes  Conditions/risks identified: mod fall risk  Next appointment: Follow up in one year for your annual wellness visit 03/01/2024 @2 :30pm   Preventive Care 65 Years and Older, Female Preventive care refers to lifestyle choices and visits with your health care provider that can promote health and wellness. What does preventive care include? A yearly physical exam. This is also called an annual well check. Dental exams once or twice a year. Routine eye exams. Ask your health care provider how often you should have your eyes checked. Personal lifestyle choices, including: Daily care of your teeth and gums. Regular physical activity. Eating a healthy diet. Avoiding tobacco and drug use. Limiting alcohol use. Practicing safe sex. Taking low-dose aspirin every day. Taking vitamin and mineral supplements as recommended by your health care provider. What happens during an annual well  check? The services and screenings done by your health care provider during your annual well check will depend on your age, overall health, lifestyle risk factors, and family history of disease. Counseling  Your health care provider may ask you questions about your: Alcohol use. Tobacco use. Drug use. Emotional well-being. Home and relationship well-being. Sexual activity. Eating habits. History of falls. Memory and ability to understand (cognition). Work and work Astronomer. Reproductive health. Screening  You may have the following tests or measurements: Height, weight, and BMI. Blood pressure. Lipid and cholesterol levels. These may be checked every 5 years, or more frequently if you are over 41 years old. Skin check. Lung cancer screening. You may have this screening every year starting at age 7 if you have a 30-pack-year history of smoking and currently smoke or have quit within the past 15 years. Fecal occult blood test (FOBT) of the stool. You may have this test every year starting at age 74. Flexible sigmoidoscopy or colonoscopy. You may have a sigmoidoscopy every 5 years or a colonoscopy every 10 years starting at age 41. Hepatitis C blood test. Hepatitis B blood test. Sexually transmitted disease (STD) testing. Diabetes screening. This is done by checking your blood sugar (glucose) after you have not eaten for a while (fasting). You may have this done every 1-3 years. Bone density scan. This is done to screen for osteoporosis. You may have this done starting at age 24. Mammogram. This  may be done every 1-2 years. Talk to your health care provider about how often you should have regular mammograms. Talk with your health care provider about your test results, treatment options, and if necessary, the need for more tests. Vaccines  Your health care provider may recommend certain vaccines, such as: Influenza vaccine. This is recommended every year. Tetanus, diphtheria, and  acellular pertussis (Tdap, Td) vaccine. You may need a Td booster every 10 years. Zoster vaccine. You may need this after age 42. Pneumococcal 13-valent conjugate (PCV13) vaccine. One dose is recommended after age 60. Pneumococcal polysaccharide (PPSV23) vaccine. One dose is recommended after age 79. Talk to your health care provider about which screenings and vaccines you need and how often you need them. This information is not intended to replace advice given to you by your health care provider. Make sure you discuss any questions you have with your health care provider. Document Released: 10/31/2015 Document Revised: 06/23/2016 Document Reviewed: 08/05/2015 Elsevier Interactive Patient Education  2017 [place] Prevention in the Home Falls can cause injuries. They can happen to people of all ages. There are many things you can do to make your home safe and to help prevent falls. What can I do on the outside of my home? Regularly fix the edges of walkways and driveways and fix any cracks. Remove anything that might make you trip as you walk through a door, such as a raised step or threshold. Trim any bushes or trees on the path to your home. Use bright outdoor lighting. Clear any walking paths of anything that might make someone trip, such as rocks or tools. Regularly check to see if handrails are loose or broken. Make sure that both sides of any steps have handrails. Any raised decks and porches should have guardrails on the edges. Have any leaves, snow, or ice cleared regularly. Use sand or salt on walking paths during winter. Clean up any spills in your garage right away. This includes oil or grease spills. What can I do in the bathroom? Use night lights. Install grab bars by the toilet and in the tub and shower. Do not use towel bars as grab bars. Use non-skid mats or decals in the tub or shower. If you need to sit down in the shower, use a plastic, non-slip stool. Keep  the floor dry. Clean up any water that spills on the floor as soon as it happens. Remove soap buildup in the tub or shower regularly. Attach bath mats securely with double-sided non-slip rug tape. Do not have throw rugs and other things on the floor that can make you trip. What can I do in the bedroom? Use night lights. Make sure that you have a light by your bed that is easy to reach. Do not use any sheets or blankets that are too big for your bed. They should not hang down onto the floor. Have a firm chair that has side arms. You can use this for support while you get dressed. Do not have throw rugs and other things on the floor that can make you trip. What can I do in the kitchen? Clean up any spills right away. Avoid walking on wet floors. Keep items that you use a lot in easy-to-reach places. If you need to reach something above you, use a strong step stool that has a grab bar. Keep electrical cords out of the way. Do not use floor polish or wax that makes floors slippery. If you must  use wax, use non-skid floor wax. Do not have throw rugs and other things on the floor that can make you trip. What can I do with my stairs? Do not leave any items on the stairs. Make sure that there are handrails on both sides of the stairs and use them. Fix handrails that are broken or loose. Make sure that handrails are as long as the stairways. Check any carpeting to make sure that it is firmly attached to the stairs. Fix any carpet that is loose or worn. Avoid having throw rugs at the top or bottom of the stairs. If you do have throw rugs, attach them to the floor with carpet tape. Make sure that you have a light switch at the top of the stairs and the bottom of the stairs. If you do not have them, ask someone to add them for you. What else can I do to help prevent falls? Wear shoes that: Do not have high heels. Have rubber bottoms. Are comfortable and fit you well. Are closed at the toe. Do not  wear sandals. If you use a stepladder: Make sure that it is fully opened. Do not climb a closed stepladder. Make sure that both sides of the stepladder are locked into place. Ask someone to hold it for you, if possible. Clearly mark and make sure that you can see: Any grab bars or handrails. First and last steps. Where the edge of each step is. Use tools that help you move around (mobility aids) if they are needed. These include: Canes. Walkers. Scooters. Crutches. Turn on the lights when you go into a dark area. Replace any light bulbs as soon as they burn out. Set up your furniture so you have a clear path. Avoid moving your furniture around. If any of your floors are uneven, fix them. If there are any pets around you, be aware of where they are. Review your medicines with your doctor. Some medicines can make you feel dizzy. This can increase your chance of falling. Ask your doctor what other things that you can do to help prevent falls. This information is not intended to replace advice given to you by your health care provider. Make sure you discuss any questions you have with your health care provider. Document Released: 07/31/2009 Document Revised: 03/11/2016 Document Reviewed: 11/08/2014 Elsevier Interactive Patient Education  2017 [employer].

## 2023-02-24 NOTE — Progress Notes (Signed)
Subjective:   Lisa Caldwell is a 77 y.o. female who presents for Medicare Annual (Subsequent) preventive examination.  Review of Systems    Cardiac Risk Factors include: advanced age (>38men, >50 women);dyslipidemia   Objective:    Today's Vitals   02/24/23 1400  Weight: 159 lb 14.4 oz (72.5 kg)  Height: 5' 4.5" (1.638 m)   Body mass index is 27.02 kg/m.     02/24/2023    2:16 PM 12/16/2022    1:45 PM 10/02/2022   10:52 PM 10/23/2021   11:09 AM 08/07/2021    4:03 PM 03/09/2021    6:44 AM 05/26/2020    9:00 AM  Advanced Directives  Does Patient Have a Medical Advance Directive? Yes Yes No No Yes Yes Yes  Type of Estate agent of [place];Living will    Healthcare Power of [place];Living will Living will Healthcare Power of [place];Living will  Copy of Healthcare Power of Attorney in Chart? No - copy requested      No - copy requested  Would patient like information on creating a medical advance directive?    No - Patient declined       Current Medications (verified) Outpatient Encounter Medications as of 02/24/2023  Medication Sig   apixaban (ELIQUIS) 5 MG TABS tablet Take 1 tablet (5 mg total) by mouth 2 (two) times daily.   Ascorbic Acid (VITAMIN C) 1000 MG tablet Take 1,000 mg by mouth daily.   Biotin 5000 MCG TABS Take 1 tablet by mouth daily.   Calcium Carb-Cholecalciferol (CALCIUM PLUS VITAMIN D3) 600-20 MG-MCG TABS Take 1 tablet by mouth daily.   chlorpheniramine (CHLOR-TRIMETON) 4 MG tablet Take 4 mg by mouth 2 (two) times daily as needed for allergies.   Cholecalciferol (VITAMIN D3 PO) Take by mouth daily.    citalopram (CELEXA) 20 MG tablet Take 1 tablet (20 mg total) by mouth daily.   diphenhydrAMINE (BENADRYL) 25 MG tablet Take 25 mg by mouth daily.   fluticasone (FLONASE) 50 MCG/ACT nasal spray USE 2 SPRAYS IN BOTH  NOSTRILS DAILY   metoprolol succinate (TOPROL-XL) 25 MG 24 hr tablet Take 2 tablets (50 mg total) by mouth daily.   Misc Natural  Products (OSTEO BI-FLEX TRIPLE STRENGTH PO) Take 1 tablet by mouth daily.   Multiple Vitamin (MULTIVITAMIN) tablet Take 1 tablet by mouth daily.   omeprazole (PRILOSEC) 20 MG capsule Take 1 capsule (20 mg total) by mouth daily.   simvastatin (ZOCOR) 10 MG tablet Take 1 tablet (10 mg total) by mouth at bedtime.   vitamin B-12 (CYANOCOBALAMIN) 1000 MCG tablet Take 1 tablet (1,000 mcg total) by mouth daily. (Patient taking differently: Take 1,000 mcg by mouth 3 (three) times a week.)   Vitamin D-Vitamin K (K2-D3 5000) 5000-90 UNIT-MCG CAPS Take 1 capsule by mouth daily.   No facility-administered encounter medications on file as of 02/24/2023.    Allergies (verified) Codeine, Peanut-containing drug products, and Phenergan [promethazine hcl]   History: Past Medical History:  Diagnosis Date   Allergy    Anxiety    Arthritis    Cancer (HCC)    colon cancer   Depression    Dysrhythmia    GERD (gastroesophageal reflux disease)    Headache(784.0)    Hyperlipidemia    Hypertension    Inflammatory polyps of colon with rectal bleeding (HCC) 2008    rt colectomy for a polyp with carcinoma in situ    Personal history of malignant neoplasm of large intestine  PONV (postoperative nausea and vomiting)    Past Surgical History:  Procedure Laterality Date   ABDOMINAL HYSTERECTOMY  10/18/1994   APPENDECTOMY     COLECTOMY Right 10/18/2006   COLONOSCOPY  10/18/2010   Dr. Evette Cristal   COLONOSCOPY WITH PROPOFOL N/A 03/10/2016   Procedure: COLONOSCOPY WITH PROPOFOL;  Surgeon: Kieth Brightly, MD;  Location: [facility] ENDOSCOPY;  Service: Endoscopy;  Laterality: N/A;   COLONOSCOPY WITH PROPOFOL N/A 03/09/2021   Procedure: COLONOSCOPY WITH PROPOFOL;  Surgeon: Wyline Mood, MD;  Location: [facility] ENDOSCOPY;  Service: Gastroenterology;  Laterality: N/A;   dislocated shoulder  Right    placed back into the socket   FOOT SURGERY Left 10/18/2010   FOOT SURGERY Left 10/19/2011   THYROGLOSSAL DUCT CYST N/A  09/04/2015   Procedure: THYROGLOSSAL DUCT CYST;  Surgeon: Bud Face, MD;  Location: [facility] ORS;  Service: ENT;  Laterality: N/A;   Family History  Problem Relation Age of Onset   Heart disease Mother    Heart disease Father    Drug abuse Brother    Breast cancer Neg Hx    Social History   Socioeconomic History   Marital status: Married    Spouse name: Tom   Number of children: 2   Years of education: Not on file   Highest education level: 12th grade  Occupational History   Occupation: Retired  Tobacco Use   Smoking status: Never   Smokeless tobacco: Never  Vaping Use   Vaping Use: Never used  Substance and Sexual Activity   Alcohol use: Yes    Comment: 2 a day   Drug use: No   Sexual activity: Not Currently    Birth control/protection: Other-see comments    Comment: hysterectomy  Other Topics Concern   Not on file  Social History Narrative   Not on file   Social Determinants of Health   Financial Resource Strain: Low Risk  (02/24/2023)   Overall Financial Resource Strain (CARDIA)    Difficulty of Paying Living Expenses: Not hard at all  Food Insecurity: No Food Insecurity (02/24/2023)   Hunger Vital Sign    Worried About Running Out of Food in the Last Year: Never true    Ran Out of Food in the Last Year: Never true  Transportation Needs: No Transportation Needs (02/24/2023)   PRAPARE - Administrator, Civil Service (Medical): No    Lack of Transportation (Non-Medical): No  Physical Activity: Sufficiently Active (02/24/2023)   Exercise Vital Sign    Days of Exercise per Week: 5 days    Minutes of Exercise per Session: 40 min  Stress: No Stress Concern Present (02/24/2023)   [employer] of Occupational Health - Occupational Stress Questionnaire    Feeling of Stress : Not at all  Social Connections: Moderately Isolated (02/24/2023)   Social Connection and Isolation Panel [NHANES]    Frequency of Communication with Friends and Family: More than three  times a week    Frequency of Social Gatherings with Friends and Family: More than three times a week    Attends Religious Services: Never    Database administrator or Organizations: No    Attends Engineer, structural: Never    Marital Status: Married    Tobacco Counseling Counseling given: Not Answered   Clinical Intake:  Pre-visit preparation completed: Yes  Pain : No/denies pain   BMI - recorded: 27.02 Nutritional Status: BMI 25 -29 Overweight Nutritional Risks: None Diabetes: No  How often do you  need to have someone help you when you read instructions, pamphlets, or other written materials from your doctor or pharmacy?: 1 - Never  Diabetic?no  Interpreter Needed?: No  Comments: lives with husband Information entered by :: B.Ladene Allocca,LPN   Activities of Daily Living    02/24/2023    2:17 PM 12/16/2022    1:47 PM  In your present state of health, do you have any difficulty performing the following activities:  Hearing? 0   Vision? 0   Difficulty concentrating or making decisions? 0   Walking or climbing stairs? 0   Dressing or bathing? 0   Doing errands, shopping? 0 0  Preparing Food and eating ? N   Using the Toilet? N   In the past six months, have you accidently leaked urine? N   Do you have problems with loss of bowel control? N   Managing your Medications? N   Managing your Finances? N   Housekeeping or managing your Housekeeping? N     Patient Care Team: Margarita Mail, DO as PCP - General (Internal Medicine)  Indicate any recent Medical Services you may have received from other than [facility] providers in the past year (date may be approximate).     Assessment:   This is a routine wellness examination for [place].  Hearing/Vision screen Hearing Screening - Comments:: Adequate hearing Vision Screening - Comments:: Adequate vision with glasses [facility]  Dietary issues and exercise activities discussed: Current Exercise Habits:  Structured exercise class, Type of exercise: stretching;walking;strength training/weights, Time (Minutes): 45, Frequency (Times/Week): 5, Weekly Exercise (Minutes/Week): 225, Intensity: Moderate   Goals Addressed             This Visit's Progress    Prevent falls   On track    Recommend to remove any items from the home that may cause slips or trips.     Reduce alcohol intake to 1 serving a day.    On track      Depression Screen    02/24/2023    2:13 PM 11/25/2022   10:52 AM 07/20/2022    2:56 PM 03/16/2022    2:15 PM 03/16/2022    2:14 PM 11/16/2021    2:10 PM 10/23/2021   11:10 AM  PHQ 2/9 Scores  PHQ - 2 Score 0 0 0 0 0 0 0  PHQ- 9 Score  0 0 0  0     Fall Risk    02/24/2023    2:08 PM 11/25/2022   10:52 AM 07/20/2022    2:56 PM 03/16/2022    2:14 PM 11/16/2021    2:10 PM  Fall Risk   Falls in the past year? 1 1 0 0 0  Number falls in past yr: 0 0 0 0 0  Injury with Fall? 1 1 0 0 0  Comment fell on deck 10/02/2022;dislocated rt shoulder      Risk for fall due to : No Fall Risks  No Fall Risks No Fall Risks No Fall Risks  Follow up Education provided;Falls prevention discussed  Falls evaluation completed Falls evaluation completed Falls evaluation completed    FALL RISK PREVENTION PERTAINING TO THE HOME:  Any stairs in or around the home? No  If so, are there any without handrails? No  Home free of loose throw rugs in walkways, pet beds, electrical cords, etc? Yes  Adequate lighting in your home to reduce risk of falls? Yes   ASSISTIVE DEVICES UTILIZED TO PREVENT FALLS:  Life alert?  No  Use of a cane, walker or w/c? No  Grab bars in the bathroom? Yes  Shower chair or bench in shower? No  Elevated toilet seat or a handicapped toilet? Yes   TIMED UP AND GO:  Was the test performed? Yes .  Length of time to ambulate 10 feet: 8 sec.  Gait steady and fast without use of assistive device Gait steady and fast without use of assistive device  Cognitive Function:     10/23/2021   11:12 AM  MMSE - Mini Mental State Exam  Not completed: Unable to complete        02/24/2023    2:24 PM 10/23/2021   11:12 AM 05/26/2020    9:10 AM 05/12/2017    1:44 PM  6CIT Screen  What Year? 0 points 0 points 0 points 0 points  What month? 0 points 0 points 0 points 0 points  What time? 0 points 0 points 0 points 0 points  Count back from 20 0 points 0 points 0 points 0 points  Months in reverse 0 points 0 points 0 points 0 points  Repeat phrase 0 points 0 points 0 points 0 points  Total Score 0 points 0 points 0 points 0 points    Immunizations Immunization History  Administered Date(s) Administered   Fluad Quad(high Dose 65+) 07/20/2022   H1N1 09/25/2008   Influenza Split 07/16/2010, 07/28/2011, 08/08/2012   Influenza, High Dose Seasonal PF 07/16/2014, 07/19/2015, 07/09/2016, 07/21/2017, 07/19/2018, 07/03/2020   Influenza,inj,Quad PF,6+ Mos 07/12/2013   Influenza-Unspecified 07/13/2021   PFIZER Comirnaty(Gray Top)Covid-19 Tri-Sucrose Vaccine 01/30/2021   PFIZER(Purple Top)SARS-COV-2 Vaccination 12/08/2019, 01/01/2020, 07/17/2020, 07/13/2021   PNEUMOCOCCAL CONJUGATE-20 11/25/2022   Pneumococcal Conjugate-13 05/05/2015   Pneumococcal Polysaccharide-23 08/27/2011   Tdap 01/11/2006   Zoster Recombinat (Shingrix) 05/16/2017, 07/21/2017   Zoster, Live 08/17/2011    TDAP status: Up to date  Flu Vaccine status: Up to date  Pneumococcal vaccine status: Up to date  Covid-19 vaccine status: Completed vaccines  Qualifies for Shingles Vaccine? Yes   Zostavax completed Yes   Shingrix Completed?: Yes  Screening Tests Health Maintenance  Topic Date Due   COVID-19 Vaccine (6 - 2023-24 season) 06/18/2022   MAMMOGRAM  04/20/2023   INFLUENZA VACCINE  05/19/2023   Medicare Annual Wellness (AWV)  02/24/2024   COLONOSCOPY (Pts 45-41yrs Insurance coverage will need to be confirmed)  03/09/2026   DEXA SCAN  06/25/2026   Pneumonia Vaccine 61+ Years old  Completed    Hepatitis C Screening  Completed   Zoster Vaccines- Shingrix  Completed   HPV VACCINES  Aged Out   DTaP/Tdap/Td  Discontinued    Health Maintenance  Health Maintenance Due  Topic Date Due   COVID-19 Vaccine (6 - 2023-24 season) 06/18/2022    Colorectal cancer screening: No longer required.   Mammogram status: No longer required due to age.  Bone Density status: Completed yes. Results reflect: Bone density results: OSTEOPENIA. Repeat every 3 years.  Lung Cancer Screening: (Low Dose CT Chest recommended if Age 71-80 years, 30 pack-year currently smoking OR have quit w/in 15years.) does not qualify.   Lung Cancer Screening Referral: no  Additional Screening:  Hepatitis C Screening: no qualify; Completed yes  Vision Screening: Recommended annual ophthalmology exams for early detection of glaucoma and other disorders of the eye. Is the patient up to date with their annual eye exam?  Yes  Who is the provider or what is the name of the office in which the patient attends annual  eye exams? [facility] If pt is not established with a provider, would they like to be referred to a provider to establish care? No .   Dental Screening: Recommended annual dental exams for proper oral hygiene  Community Resource Referral / Chronic Care Management: CRR required this visit?  No   CCM required this visit?  No      Plan:     I have personally reviewed and noted the following in the patient's chart:   Medical and social history Use of alcohol, tobacco or illicit drugs  Current medications and supplements including opioid prescriptions. Patient is not currently taking opioid prescriptions. Functional ability and status Nutritional status Physical activity Advanced directives List of other physicians Hospitalizations, surgeries, and ER visits in previous 12 months Vitals Screenings to include cognitive, depression, and falls Referrals and appointments  In addition, I have  reviewed and discussed with patient certain preventive protocols, quality metrics, and best practice recommendations. A written personalized care plan for preventive services as well as general preventive health recommendations were provided to patient.     Sue Lush, LPN   10/23/1094   Nurse Notes: The patient states she is doing well and has no concerns or questions at this time.

## 2023-03-27 NOTE — Progress Notes (Unsigned)
Established Patient Office Visit  Subjective    Patient ID: Lisa Caldwell, female    DOB: 1946-06-03  Age: 77 y.o. MRN: [id number]  CC:  No chief complaint on file.   HPI LAKAIYA AYNES presents to discuss referral to Cardiology.   Hypertension/A.Fib: -Medications: Metoprolol XL 25 mg BID, Eliquis 5 mg BID -Patient is compliant with above medications and reports no side effects. -Denies any SOB, CP, vision changes, LE edema or symptoms of hypotension  HLD: -Medications: Zocor 10 mg -Patient is compliant with above medications and reports no side effects.  -Last lipid panel: Lipid Panel     Component Value Date/Time   CHOL 195 07/23/2022 0934   CHOL 201 (H) 06/12/2021 1052   TRIG 79 07/23/2022 0934   HDL 64 07/23/2022 0934   HDL 77 06/12/2021 1052   CHOLHDL 3.0 07/23/2022 0934   LDLCALC 113 (H) 07/23/2022 0934   LABVLDL 14 06/12/2021 1052   MDD: -Mood status: stable -Current treatment: Celexa 20 mg -Satisfied with current treatment?: no -Duration of current treatment : years -Side effects: no Medication compliance: excellent compliance     02/24/2023    2:13 PM 11/25/2022   10:52 AM 07/20/2022    2:56 PM 03/16/2022    2:15 PM 03/16/2022    2:14 PM  Depression screen PHQ 2/9  Decreased Interest 0 0 0 0 0  Down, Depressed, Hopeless 0 0 0 0 0  PHQ - 2 Score 0 0 0 0 0  Altered sleeping  0 0 0   Tired, decreased energy  0 0 0   Change in appetite  0 0 0   Feeling bad or failure about yourself   0 0 0   Trouble concentrating  0 0 0   Moving slowly or fidgety/restless  0 0 0   Suicidal thoughts  0 0 0   PHQ-9 Score  0 0 0   Difficult doing work/chores  Not difficult at all Not difficult at all Not difficult at all    GERD: -Currently on Prilosec 20 mg, taking daily but feels like she does not need it daily   Vitamin D/Vitamin B12 Deficiency: -Currently on Vitamin B12 1000 mcg daily, Vitamin D daily, uncertain dose  History of Colon Cancer:  -Diagnosed with  cancerous colon polyp in 2008, underwent partial colectomy at the time. Did not need radiation/chemotherapy.  -Colonoscopy in 2022 negative, was told she did not need further screening  Health Maintenance: -Blood work UTD -Mammogram 7/23 Birads-1, will be du along with DEXA in the summer -Prevnar 20 due  Outpatient Encounter Medications as of 03/28/2023  Medication Sig   apixaban (ELIQUIS) 5 MG TABS tablet Take 1 tablet (5 mg total) by mouth 2 (two) times daily.   Ascorbic Acid (VITAMIN C) 1000 MG tablet Take 1,000 mg by mouth daily.   Biotin 5000 MCG TABS Take 1 tablet by mouth daily.   Calcium Carb-Cholecalciferol (CALCIUM PLUS VITAMIN D3) 600-20 MG-MCG TABS Take 1 tablet by mouth daily.   chlorpheniramine (CHLOR-TRIMETON) 4 MG tablet Take 4 mg by mouth 2 (two) times daily as needed for allergies.   Cholecalciferol (VITAMIN D3 PO) Take by mouth daily.    citalopram (CELEXA) 20 MG tablet Take 1 tablet (20 mg total) by mouth daily.   diphenhydrAMINE (BENADRYL) 25 MG tablet Take 25 mg by mouth daily.   fluticasone (FLONASE) 50 MCG/ACT nasal spray USE 2 SPRAYS IN BOTH  NOSTRILS DAILY   metoprolol succinate (TOPROL-XL) 25 MG  24 hr tablet Take 2 tablets (50 mg total) by mouth daily.   Misc Natural Products (OSTEO BI-FLEX TRIPLE STRENGTH PO) Take 1 tablet by mouth daily.   Multiple Vitamin (MULTIVITAMIN) tablet Take 1 tablet by mouth daily.   omeprazole (PRILOSEC) 20 MG capsule Take 1 capsule (20 mg total) by mouth daily.   simvastatin (ZOCOR) 10 MG tablet Take 1 tablet (10 mg total) by mouth at bedtime.   vitamin B-12 (CYANOCOBALAMIN) 1000 MCG tablet Take 1 tablet (1,000 mcg total) by mouth daily. (Patient taking differently: Take 1,000 mcg by mouth 3 (three) times a week.)   Vitamin D-Vitamin K (K2-D3 5000) 5000-90 UNIT-MCG CAPS Take 1 capsule by mouth daily.   No facility-administered encounter medications on file as of 03/28/2023.    Past Medical History:  Diagnosis Date   Allergy     Anxiety    Arthritis    Cancer (HCC)    colon cancer   Depression    Dysrhythmia    GERD (gastroesophageal reflux disease)    Headache(784.0)    Hyperlipidemia    Hypertension    Inflammatory polyps of colon with rectal bleeding (HCC) 2008    rt colectomy for a polyp with carcinoma in situ    Personal history of malignant neoplasm of large intestine    PONV (postoperative nausea and vomiting)     Past Surgical History:  Procedure Laterality Date   ABDOMINAL HYSTERECTOMY  10/18/1994   APPENDECTOMY     COLECTOMY Right 10/18/2006   COLONOSCOPY  10/18/2010   Dr. Evette Cristal   COLONOSCOPY WITH PROPOFOL N/A 03/10/2016   Procedure: COLONOSCOPY WITH PROPOFOL;  Surgeon: Kieth Brightly, MD;  Location: [facility] ENDOSCOPY;  Service: Endoscopy;  Laterality: N/A;   COLONOSCOPY WITH PROPOFOL N/A 03/09/2021   Procedure: COLONOSCOPY WITH PROPOFOL;  Surgeon: Wyline Mood, MD;  Location: [facility] ENDOSCOPY;  Service: Gastroenterology;  Laterality: N/A;   dislocated shoulder  Right    placed back into the socket   FOOT SURGERY Left 10/18/2010   FOOT SURGERY Left 10/19/2011   THYROGLOSSAL DUCT CYST N/A 09/04/2015   Procedure: THYROGLOSSAL DUCT CYST;  Surgeon: Bud Face, MD;  Location: [facility] ORS;  Service: ENT;  Laterality: N/A;    Family History  Problem Relation Age of Onset   Heart disease Mother    Heart disease Father    Drug abuse Brother    Breast cancer Neg Hx     Social History   Socioeconomic History   Marital status: Married    Spouse name: Tom   Number of children: 2   Years of education: Not on file   Highest education level: GED or equivalent  Occupational History   Occupation: Retired  Tobacco Use   Smoking status: Never   Smokeless tobacco: Never  Vaping Use   Vaping Use: Never used  Substance and Sexual Activity   Alcohol use: Yes    Comment: 2 a day   Drug use: No   Sexual activity: Not Currently    Birth control/protection: Other-see comments    Comment:  hysterectomy  Other Topics Concern   Not on file  Social History Narrative   Not on file   Social Determinants of Health   Financial Resource Strain: Low Risk  (03/25/2023)   Overall Financial Resource Strain (CARDIA)    Difficulty of Paying Living Expenses: Not hard at all  Food Insecurity: No Food Insecurity (03/25/2023)   Hunger Vital Sign    Worried About Running Out of Food in the  Last Year: Never true    Ran Out of Food in the Last Year: Never true  Transportation Needs: No Transportation Needs (03/25/2023)   PRAPARE - Administrator, Civil Service (Medical): No    Lack of Transportation (Non-Medical): No  Physical Activity: Sufficiently Active (03/25/2023)   Exercise Vital Sign    Days of Exercise per Week: 5 days    Minutes of Exercise per Session: 30 min  Stress: No Stress Concern Present (03/25/2023)   [employer] of Occupational Health - Occupational Stress Questionnaire    Feeling of Stress : Only a little  Social Connections: Moderately Isolated (03/25/2023)   Social Connection and Isolation Panel [NHANES]    Frequency of Communication with Friends and Family: Three times a week    Frequency of Social Gatherings with Friends and Family: Three times a week    Attends Religious Services: Never    Active Member of Clubs or Organizations: No    Attends Banker Meetings: Never    Marital Status: Married  Catering manager Violence: Not At Risk (02/24/2023)   Humiliation, Afraid, Rape, and Kick questionnaire    Fear of Current or Ex-Partner: No    Emotionally Abused: No    Physically Abused: No    Sexually Abused: No    Review of Systems  Constitutional:  Negative for chills and fever.  Eyes:  Negative for blurred vision.  Respiratory:  Negative for shortness of breath.   Cardiovascular:  Negative for chest pain.  Gastrointestinal:  Negative for abdominal pain, blood in stool and melena.  Genitourinary:  Negative for hematuria.      Objective     There were no vitals taken for this visit.  Physical Exam Constitutional:      Appearance: Normal appearance.  HENT:     Head: Normocephalic and atraumatic.     Mouth/Throat:     Mouth: Mucous membranes are moist.     Pharynx: Oropharynx is clear.  Eyes:     Extraocular Movements: Extraocular movements intact.     Conjunctiva/sclera: Conjunctivae normal.     Pupils: Pupils are equal, round, and reactive to light.  Neck:     Comments: No thyromegaly  Cardiovascular:     Rate and Rhythm: Normal rate and regular rhythm.  Pulmonary:     Effort: Pulmonary effort is normal.     Breath sounds: Normal breath sounds.  Musculoskeletal:     Cervical back: No tenderness.     Right lower leg: No edema.     Left lower leg: No edema.  Lymphadenopathy:     Cervical: No cervical adenopathy.  Skin:    General: Skin is warm and dry.  Neurological:     General: No focal deficit present.     Mental Status: She is alert. Mental status is at baseline.  Psychiatric:        Mood and Affect: Mood normal.        Behavior: Behavior normal.         Assessment & Plan:   1. Hypertension, unspecified type/Paroxysmal atrial fibrillation (HCC): Chronic, BP at goal today. A. Fib diagnosis relatively new but doing well on Eliquis 5 mg BID, Metoprolol 50 mg.   2. Hypercholesterolemia: Stable, reviewed labs from October. Doing well on Zocor 10 mg.   3. Episode of recurrent major depressive disorder, unspecified depression episode severity (HCC): Stable, has been on Celexa 20 mg for years, doing well.   4. Gastroesophageal reflux disease without esophagitis:  Chronic and stable, has been taking Prilosec 20 mg daily but will start it as needed.   6. Encounter for screening mammogram for malignant neoplasm of breast/Osteoporosis screening/Estrogen deficiency: Mammogram and DEXA ordered for the summer.  - MM Digital Screening; Future - DG Bone Density; Future  7. Vaccine for streptococcus pneumoniae  and influenza: Prevnar 20 administered today.   - Pneumococcal conjugate vaccine 20-valent (Prevnar 20)   No follow-ups on file.   Margarita Mail, DO

## 2023-03-28 ENCOUNTER — Ambulatory Visit (INDEPENDENT_AMBULATORY_CARE_PROVIDER_SITE_OTHER): Payer: 59 | Admitting: Physician Assistant

## 2023-03-28 ENCOUNTER — Encounter: Payer: Self-pay | Admitting: Physician Assistant

## 2023-03-28 VITALS — BP 126/72 | HR 72 | Resp 16 | Ht 64.0 in | Wt 160.0 lb

## 2023-03-28 DIAGNOSIS — I480 Paroxysmal atrial fibrillation: Secondary | ICD-10-CM | POA: Diagnosis not present

## 2023-03-28 NOTE — Assessment & Plan Note (Signed)
Chronic, historic condition Appears well managed on current regimen She would like referral to Dr. Kirke Corin  She is currently taking Eliquis 5 mg PO BID, metoprolol 25 mg PO BID- and appears to be tolerating well Continue current regimen Will place referral per patient request Recommend follow up in 6 months or sooner if concerns arise

## 2023-04-26 ENCOUNTER — Ambulatory Visit
Admission: RE | Admit: 2023-04-26 | Discharge: 2023-04-26 | Disposition: A | Discharge status: Home / Self Care | Payer: 59 | Source: Ambulatory Visit | Attending: Internal Medicine | Admitting: Internal Medicine

## 2023-04-26 DIAGNOSIS — Z1231 Encounter for screening mammogram for malignant neoplasm of breast: Secondary | ICD-10-CM

## 2023-04-26 DIAGNOSIS — M8589 Other specified disorders of bone density and structure, multiple sites: Secondary | ICD-10-CM | POA: Diagnosis not present

## 2023-04-26 DIAGNOSIS — Z13820 Encounter for screening for osteoporosis: Secondary | ICD-10-CM | POA: Diagnosis not present

## 2023-04-26 DIAGNOSIS — E2839 Other primary ovarian failure: Secondary | ICD-10-CM | POA: Insufficient documentation

## 2023-05-26 NOTE — Progress Notes (Signed)
Established Patient Office Visit  Subjective    Patient ID: Lisa Caldwell, female    DOB: 1946/07/12  Age: 77 y.o. MRN: [id number]  CC:  Chief Complaint  Patient presents with   Follow-up    HPI [name] presents to follow up on chronic medical conditions. Patient is doing well and reports no issues today.  Hypertension/A.Fib: -Medications: Metoprolol XL 25 mg BID, Eliquis 5 mg BID She denies recent palpitation, chest pain, tachycardia or concerns with A.fib  -Patient is compliant with above medications and reports no side effects. -Denies any SOB, CP, vision changes, LE edema or symptoms of hypotension  HLD: -Medications: Zocor 10 mg -Patient is compliant with above medications and reports no side effects.  -Last lipid panel: Lipid Panel     Component Value Date/Time   CHOL 195 07/23/2022 0934   CHOL 201 (H) 06/12/2021 1052   TRIG 79 07/23/2022 0934   HDL 64 07/23/2022 0934   HDL 77 06/12/2021 1052   CHOLHDL 3.0 07/23/2022 0934   LDLCALC 113 (H) 07/23/2022 0934   LABVLDL 14 06/12/2021 1052   MDD: -Mood status: stable -Current treatment: Celexa 20 mg -Satisfied with current treatment?: no -Duration of current treatment : years -Side effects: no -Medication compliance: excellent compliance      05/27/2023   12:56 PM 03/28/2023    1:08 PM 02/24/2023    2:13 PM 11/25/2022   10:52 AM 07/20/2022    2:56 PM  Depression screen PHQ 2/9  Decreased Interest 0 0 0 0 0  Down, Depressed, Hopeless 0 0 0 0 0  PHQ - 2 Score 0 0 0 0 0  Altered sleeping 0 0  0 0  Tired, decreased energy 0 0  0 0  Change in appetite 0 0  0 0  Feeling bad or failure about yourself  0 0  0 0  Trouble concentrating 0 0  0 0  Moving slowly or fidgety/restless 0 0  0 0  Suicidal thoughts 0 0  0 0  PHQ-9 Score 0 0  0 0  Difficult doing work/chores Not difficult at all   Not difficult at all Not difficult at all   GERD: -Currently on Prilosec 20 mg, taking daily but feels like she does not  need it daily    Vitamin D/Vitamin B12 Deficiency: -Currently on Vitamin B12 1000 mcg daily, Vitamin D daily   History of Colon Cancer:  -Diagnosed with cancerous colon polyp in 2008, underwent partial colectomy at the time. Did not need radiation/chemotherapy.  -Colonoscopy in 2022 negative, was told she did not need further screening   Health Maintenance: -Blood work UTD, plan to repeat fasting labs at follow up -Mammogram 7/24 Birads-1 -DEXA 7/24 stable, osteopenia in the right hip and left forearm - taking vitamin d and calcium, also doing aerobics and lift weights, [place] chi as regular exercise    Outpatient Encounter Medications as of 05/27/2023  Medication Sig   apixaban (ELIQUIS) 5 MG TABS tablet Take 1 tablet (5 mg total) by mouth 2 (two) times daily.   Ascorbic Acid (VITAMIN C) 1000 MG tablet Take 1,000 mg by mouth daily.   Biotin 5000 MCG TABS Take 1 tablet by mouth daily.   Calcium Carb-Cholecalciferol (CALCIUM PLUS VITAMIN D3) 600-20 MG-MCG TABS Take 1 tablet by mouth daily.   chlorpheniramine (CHLOR-TRIMETON) 4 MG tablet Take 4 mg by mouth 2 (two) times daily as needed for allergies.   Cholecalciferol (VITAMIN D3 PO) Take by mouth  daily.    citalopram (CELEXA) 20 MG tablet Take 1 tablet (20 mg total) by mouth daily.   diphenhydrAMINE (BENADRYL) 25 MG tablet Take 25 mg by mouth daily.   fluticasone (FLONASE) 50 MCG/ACT nasal spray USE 2 SPRAYS IN BOTH  NOSTRILS DAILY   metoprolol succinate (TOPROL-XL) 25 MG 24 hr tablet Take 2 tablets (50 mg total) by mouth daily.   Misc Natural Products (OSTEO BI-FLEX TRIPLE STRENGTH PO) Take 1 tablet by mouth daily.   Multiple Vitamin (MULTIVITAMIN) tablet Take 1 tablet by mouth daily.   omeprazole (PRILOSEC) 20 MG capsule Take 1 capsule (20 mg total) by mouth daily.   simvastatin (ZOCOR) 10 MG tablet Take 1 tablet (10 mg total) by mouth at bedtime.   vitamin B-12 (CYANOCOBALAMIN) 1000 MCG tablet Take 1 tablet (1,000 mcg total) by mouth  daily. (Patient taking differently: Take 1,000 mcg by mouth 3 (three) times a week.)   Vitamin D-Vitamin K (K2-D3 5000) 5000-90 UNIT-MCG CAPS Take 1 capsule by mouth daily.   No facility-administered encounter medications on file as of 05/27/2023.    Past Medical History:  Diagnosis Date   Allergy    Anxiety    Arthritis    Cancer (HCC)    colon cancer   Depression    Dysrhythmia    GERD (gastroesophageal reflux disease)    Headache(784.0)    Hyperlipidemia    Hypertension    Inflammatory polyps of colon with rectal bleeding (HCC) 2008    rt colectomy for a polyp with carcinoma in situ    Personal history of malignant neoplasm of large intestine    PONV (postoperative nausea and vomiting)     Past Surgical History:  Procedure Laterality Date   ABDOMINAL HYSTERECTOMY  10/18/1994   APPENDECTOMY     COLECTOMY Right 10/18/2006   COLONOSCOPY  10/18/2010   Dr. Evette Cristal   COLONOSCOPY WITH PROPOFOL N/A 03/10/2016   Procedure: COLONOSCOPY WITH PROPOFOL;  Surgeon: Kieth Brightly, MD;  Location: [facility] ENDOSCOPY;  Service: Endoscopy;  Laterality: N/A;   COLONOSCOPY WITH PROPOFOL N/A 03/09/2021   Procedure: COLONOSCOPY WITH PROPOFOL;  Surgeon: Wyline Mood, MD;  Location: [facility] ENDOSCOPY;  Service: Gastroenterology;  Laterality: N/A;   dislocated shoulder  Right    placed back into the socket   FOOT SURGERY Left 10/18/2010   FOOT SURGERY Left 10/19/2011   THYROGLOSSAL DUCT CYST N/A 09/04/2015   Procedure: THYROGLOSSAL DUCT CYST;  Surgeon: Bud Face, MD;  Location: [facility] ORS;  Service: ENT;  Laterality: N/A;    Family History  Problem Relation Age of Onset   Heart disease Mother    Heart disease Father    Drug abuse Brother    Breast cancer Neg Hx     Social History   Socioeconomic History   Marital status: Married    Spouse name: Tom   Number of children: 2   Years of education: Not on file   Highest education level: GED or equivalent  Occupational History    Occupation: Retired  Tobacco Use   Smoking status: Never   Smokeless tobacco: Never  Vaping Use   Vaping status: Never Used  Substance and Sexual Activity   Alcohol use: Yes    Comment: 2 a day   Drug use: No   Sexual activity: Not Currently    Birth control/protection: Other-see comments    Comment: hysterectomy  Other Topics Concern   Not on file  Social History Narrative   Not on file   Social  Determinants of Health   Financial Resource Strain: Low Risk  (03/25/2023)   Overall Financial Resource Strain (CARDIA)    Difficulty of Paying Living Expenses: Not hard at all  Food Insecurity: No Food Insecurity (03/25/2023)   Hunger Vital Sign    Worried About Running Out of Food in the Last Year: Never true    Ran Out of Food in the Last Year: Never true  Transportation Needs: No Transportation Needs (03/25/2023)   PRAPARE - Administrator, Civil Service (Medical): No    Lack of Transportation (Non-Medical): No  Physical Activity: Sufficiently Active (03/25/2023)   Exercise Vital Sign    Days of Exercise per Week: 5 days    Minutes of Exercise per Session: 30 min  Stress: No Stress Concern Present (03/25/2023)   [employer] of Occupational Health - Occupational Stress Questionnaire    Feeling of Stress : Only a little  Social Connections: Moderately Isolated (03/25/2023)   Social Connection and Isolation Panel [NHANES]    Frequency of Communication with Friends and Family: Three times a week    Frequency of Social Gatherings with Friends and Family: Three times a week    Attends Religious Services: Never    Active Member of Clubs or Organizations: No    Attends Banker Meetings: Never    Marital Status: Married  Catering manager Violence: Not At Risk (02/24/2023)   Humiliation, Afraid, Rape, and Kick questionnaire    Fear of Current or Ex-Partner: No    Emotionally Abused: No    Physically Abused: No    Sexually Abused: No    Review of Systems   Constitutional:  Negative for chills and fever.  Eyes:  Negative for blurred vision.  Respiratory:  Negative for shortness of breath.   Cardiovascular:  Negative for chest pain.  Gastrointestinal:  Negative for abdominal pain, blood in stool and melena.  Genitourinary:  Negative for hematuria.      Objective    BP 128/76   Pulse 79   Temp 98.2 F (36.8 C)   Resp 16   Ht 5\' 4"  (1.626 m)   Wt 159 lb 9.6 oz (72.4 kg)   SpO2 97%   BMI 27.40 kg/m   Physical Exam Vitals reviewed.  Constitutional:      General: She is awake.     Appearance: Normal appearance. She is well-developed and well-groomed.  HENT:     Head: Normocephalic and atraumatic.     Mouth/Throat:     Mouth: Mucous membranes are moist.     Pharynx: Oropharynx is clear.  Eyes:     Extraocular Movements: Extraocular movements intact.     Conjunctiva/sclera: Conjunctivae normal.     Pupils: Pupils are equal, round, and reactive to light.  Neck:     Comments: No thyromegaly  Cardiovascular:     Rate and Rhythm: Normal rate and regular rhythm.     Pulses:          Radial pulses are 2+ on the right side and 2+ on the left side.  Pulmonary:     Effort: Pulmonary effort is normal.     Breath sounds: Normal breath sounds.  Musculoskeletal:     Cervical back: No tenderness.     Right lower leg: No edema.     Left lower leg: No edema.  Lymphadenopathy:     Cervical: No cervical adenopathy.  Skin:    General: Skin is warm and dry.  Neurological:  General: No focal deficit present.     Mental Status: She is alert and oriented to person, place, and time.  Psychiatric:        Mood and Affect: Mood normal.        Behavior: Behavior normal. Behavior is cooperative.         Assessment & Plan:   1. Hypertension, unspecified type/Paroxysmal atrial fibrillation (HCC): Stable, doing well on medications. Continue Metoprolol XL 25 mg BID, Eliquis 5 mg BID.  2. Hypercholesterolemia: Stable, plan to recheck  fasting labs at next visit. Continue Zocor.   3. Recurrent major depressive disorder, in full remission [facility]): Moods stable, doing well on Celexa 20 mg.  4. Gastroesophageal reflux disease without esophagitis: Stable, doing well on Prilosec.  5. Osteopenia of multiple sites: Reviewed DEXA scan, osteopenia stable. On Vitamin D and calcium, doing well with regular weight bearing exercise.    Return in about 6 months (around 11/27/2023).   Margarita Mail, DO

## 2023-05-27 ENCOUNTER — Ambulatory Visit (INDEPENDENT_AMBULATORY_CARE_PROVIDER_SITE_OTHER): Payer: 59 | Admitting: Internal Medicine

## 2023-05-27 ENCOUNTER — Encounter: Payer: Self-pay | Admitting: Internal Medicine

## 2023-05-27 VITALS — BP 128/76 | HR 79 | Temp 98.2°F | Resp 16 | Ht 64.0 in | Wt 159.6 lb

## 2023-05-27 DIAGNOSIS — K219 Gastro-esophageal reflux disease without esophagitis: Secondary | ICD-10-CM

## 2023-05-27 DIAGNOSIS — I10 Essential (primary) hypertension: Secondary | ICD-10-CM

## 2023-05-27 DIAGNOSIS — I480 Paroxysmal atrial fibrillation: Secondary | ICD-10-CM

## 2023-05-27 DIAGNOSIS — E7800 Pure hypercholesterolemia, unspecified: Secondary | ICD-10-CM | POA: Diagnosis not present

## 2023-05-27 DIAGNOSIS — M8589 Other specified disorders of bone density and structure, multiple sites: Secondary | ICD-10-CM

## 2023-05-27 DIAGNOSIS — F3342 Major depressive disorder, recurrent, in full remission: Secondary | ICD-10-CM | POA: Diagnosis not present

## 2023-06-01 DIAGNOSIS — H524 Presbyopia: Secondary | ICD-10-CM | POA: Diagnosis not present

## 2023-06-01 DIAGNOSIS — H25813 Combined forms of age-related cataract, bilateral: Secondary | ICD-10-CM | POA: Diagnosis not present

## 2023-06-01 DIAGNOSIS — H43812 Vitreous degeneration, left eye: Secondary | ICD-10-CM | POA: Diagnosis not present

## 2023-06-01 DIAGNOSIS — H04123 Dry eye syndrome of bilateral lacrimal glands: Secondary | ICD-10-CM | POA: Diagnosis not present

## 2023-06-07 ENCOUNTER — Telehealth: Payer: Self-pay | Admitting: Internal Medicine

## 2023-06-07 NOTE — Telephone Encounter (Signed)
Copied from CRM [id number]. Topic: General - Other >> Jun 07, 2023 12:04 PM Phill Myron wrote: Ms Onstad would like to know when was her last T-Dap Vaccine was, please advise

## 2023-06-07 NOTE — Telephone Encounter (Signed)
Pt notified due

## 2023-06-14 ENCOUNTER — Ambulatory Visit: Payer: 59 | Admitting: Cardiovascular Disease

## 2023-06-14 ENCOUNTER — Encounter: Payer: Self-pay | Admitting: Cardiovascular Disease

## 2023-06-14 VITALS — BP 130/72 | HR 63 | Ht 64.0 in | Wt 159.4 lb

## 2023-06-14 DIAGNOSIS — E785 Hyperlipidemia, unspecified: Secondary | ICD-10-CM

## 2023-06-14 DIAGNOSIS — I480 Paroxysmal atrial fibrillation: Secondary | ICD-10-CM

## 2023-06-14 NOTE — Patient Instructions (Signed)
Medication Instructions:  No changes *If you need a refill on your cardiac medications before your next appointment, please call your pharmacy*   Lab Work: None ordered If you have labs (blood work) drawn today and your tests are completely normal, you will receive your results only by: [place] (if you have MyChart) OR A paper copy in the mail If you have any lab test that is abnormal or we need to change your treatment, we will call you to review the results.   Testing/Procedures: Your physician has requested that you have an echocardiogram. Echocardiography is a painless test that uses sound waves to create images of your heart. It provides your doctor with information about the size and shape of your heart and how well your heart's chambers and valves are working.   You may receive an ultrasound enhancing agent through an IV if needed to better visualize your heart during the echo. This procedure takes approximately one hour.  There are no restrictions for this procedure.  This will take place at [facility] ([id number], [place]    Follow-Up: At [facility], you and your health needs are our priority.  As part of our continuing mission to provide you with exceptional heart care, we have created designated Provider Care Teams.  These Care Teams include your primary Cardiologist (physician) and Advanced Practice Providers (APPs -  Physician Assistants and Nurse Practitioners) who all work together to provide you with the care you need, when you need it.  We recommend signing up for the patient portal called "MyChart".  Sign up information is provided on this After Visit Summary.  MyChart is used to connect with patients for Virtual Visits (Telemedicine).  Patients are able to view lab/test results, encounter notes, upcoming appointments, etc.  Non-urgent messages can be sent to your provider as well.   To learn more about what you can  do with MyChart, go to [email or web address].    Your next appointment:   6 month(s)  Provider:   You may see Dr. Fletcher Anon or one of the following Advanced Practice Providers on your designated Care Team:   Murray Hodgkins, NP Christell Faith, PA-C Cadence Kathlen Mody, PA-C Gerrie Nordmann, NP

## 2023-06-14 NOTE — Progress Notes (Unsigned)
Cardiology Office Note   Date:  06/15/2023   ID:  Lisa Caldwell, DOB [birth year], MRN [id number]  PCP:  Margarita Mail, DO  Cardiologist:   Lorine Bears, MD   Chief Complaint  Patient presents with   New Patient (Initial Visit)    Ref by Lisa Oman, DO to establish care for A-Fib; former patient of Dr. Juel Burrow for A-Fib. Patient c/o shortness of breath at times. Medications reviewed by the patient verbally.       History of Present Illness: Lisa Caldwell is a 77 y.o. female who presents to establish cardiovascular care regarding paroxysmal atrial fibrillation.  Her husband is one of my patients.  She has known history of hyperlipidemia and GERD.  She presented to [facility] ED in 2022 with dizziness and was found to be in atrial fibrillation with rapid ventricular response.  She converted to sinus rhythm with diltiazem and was placed on Eliquis and Toprol.  She has not had any recurrent atrial fibrillation since then.  She has been feeling well with no chest pain or orthopnea.  She does complain of occasional shortness of breath with exertion.  No lower extremity edema.  She takes her medications regularly and denies any bleeding complications.  She is not a smoker.  Family history is remarkable for hypertension but no premature coronary artery disease.    Past Medical History:  Diagnosis Date   Allergy    Anxiety    Arthritis    Cancer (HCC)    colon cancer   Depression    Dysrhythmia    GERD (gastroesophageal reflux disease)    Headache(784.0)    Hyperlipidemia    Hypertension    Inflammatory polyps of colon with rectal bleeding (HCC) 2008    rt colectomy for a polyp with carcinoma in situ    Personal history of malignant neoplasm of large intestine    PONV (postoperative nausea and vomiting)     Past Surgical History:  Procedure Laterality Date   ABDOMINAL HYSTERECTOMY  10/18/1994   APPENDECTOMY     COLECTOMY Right 10/18/2006   COLONOSCOPY  10/18/2010    Dr. Evette Cristal   COLONOSCOPY WITH PROPOFOL N/A 03/10/2016   Procedure: COLONOSCOPY WITH PROPOFOL;  Surgeon: Kieth Brightly, MD;  Location: [facility] ENDOSCOPY;  Service: Endoscopy;  Laterality: N/A;   COLONOSCOPY WITH PROPOFOL N/A 03/09/2021   Procedure: COLONOSCOPY WITH PROPOFOL;  Surgeon: Wyline Mood, MD;  Location: [facility] ENDOSCOPY;  Service: Gastroenterology;  Laterality: N/A;   dislocated shoulder  Right    placed back into the socket   FOOT SURGERY Left 10/18/2010   FOOT SURGERY Left 10/19/2011   THYROGLOSSAL DUCT CYST N/A 09/04/2015   Procedure: THYROGLOSSAL DUCT CYST;  Surgeon: Bud Face, MD;  Location: [facility] ORS;  Service: ENT;  Laterality: N/A;     Current Outpatient Medications  Medication Sig Dispense Refill   apixaban (ELIQUIS) 5 MG TABS tablet Take 1 tablet (5 mg total) by mouth 2 (two) times daily. 180 tablet 3   Ascorbic Acid (VITAMIN C) 1000 MG tablet Take 1,000 mg by mouth daily.     Biotin 5000 MCG TABS Take 1 tablet by mouth daily.     Calcium Carb-Cholecalciferol (CALCIUM PLUS VITAMIN D3) 600-20 MG-MCG TABS Take 1 tablet by mouth daily.     chlorpheniramine (CHLOR-TRIMETON) 4 MG tablet Take 4 mg by mouth 2 (two) times daily as needed for allergies.     citalopram (CELEXA) 20 MG tablet Take 1 tablet (20 mg total) by  mouth daily. 90 tablet 3   diphenhydrAMINE (BENADRYL) 25 MG tablet Take 25 mg by mouth daily.     fluticasone (FLONASE) 50 MCG/ACT nasal spray USE 2 SPRAYS IN BOTH  NOSTRILS DAILY 48 g 3   metoprolol succinate (TOPROL-XL) 25 MG 24 hr tablet Take 2 tablets (50 mg total) by mouth daily. 180 tablet 3   Misc Natural Products (OSTEO BI-FLEX TRIPLE STRENGTH PO) Take 1 tablet by mouth daily.     Multiple Vitamin (MULTIVITAMIN) tablet Take 1 tablet by mouth daily.     omeprazole (PRILOSEC) 20 MG capsule Take 1 capsule (20 mg total) by mouth daily. 180 capsule 3   simvastatin (ZOCOR) 10 MG tablet Take 1 tablet (10 mg total) by mouth at bedtime. 90 tablet 3    vitamin B-12 (CYANOCOBALAMIN) 1000 MCG tablet Take 1 tablet (1,000 mcg total) by mouth daily. (Patient taking differently: Take 1,000 mcg by mouth 3 (three) times a week.) 90 tablet 2   Vitamin D-Vitamin K (K2-D3 5000) 5000-90 UNIT-MCG CAPS Take 1 capsule by mouth daily.     No current facility-administered medications for this visit.    Allergies:   Codeine, Peanut-containing drug products, and Phenergan [promethazine hcl]    Social History:  The patient  reports that she has never smoked. She has never used smokeless tobacco. She reports current alcohol use. She reports that she does not use drugs.   Family History:  The patient's family history includes Drug abuse in her brother; Heart disease in her father and mother.    ROS:  Please see the history of present illness.   Otherwise, review of systems are positive for none.   All other systems are reviewed and negative.    PHYSICAL EXAM: VS:  BP 130/72 (BP Location: Right Arm, Patient Position: Sitting, Cuff Size: Normal)   Pulse 63   Ht 5\' 4"  (1.626 m)   Wt 159 lb 6 oz (72.3 kg)   SpO2 96%   BMI 27.36 kg/m  , BMI Body mass index is 27.36 kg/m. GEN: Well nourished, well developed, in no acute distress  HEENT: normal  Neck: no JVD, carotid bruits, or masses Cardiac: RRR; no murmurs, rubs, or gallops,no edema  Respiratory:  clear to auscultation bilaterally, normal work of breathing GI: soft, nontender, nondistended, + BS MS: no deformity or atrophy  Skin: warm and dry, no rash Neuro:  Strength and sensation are intact Psych: euthymic mood, full affect   EKG:  EKG is ordered today. The ekg ordered today demonstrates : Normal sinus rhythm Normal ECG When compared with ECG of 16-Dec-2022 13:11, No significant change was found    Recent Labs: 07/23/2022: TSH 3.04 12/16/2022: ALT 19; BUN 17; Creatinine, Ser 0.88; Hemoglobin 13.2; Platelets 257; Potassium 3.6; Sodium 139    Lipid Panel    Component Value Date/Time    CHOL 195 07/23/2022 0934   CHOL 201 (H) 06/12/2021 1052   TRIG 79 07/23/2022 0934   HDL 64 07/23/2022 0934   HDL 77 06/12/2021 1052   CHOLHDL 3.0 07/23/2022 0934   LDLCALC 113 (H) 07/23/2022 0934      Wt Readings from Last 3 Encounters:  06/14/23 159 lb 6 oz (72.3 kg)  05/27/23 159 lb 9.6 oz (72.4 kg)  03/28/23 160 lb (72.6 kg)           No data to display            ASSESSMENT AND PLAN:  1.  Paroxysmal atrial fibrillation.  She  had only 1 documented episode in 2022 which was symptomatic and associated with tachycardia.  Her symptoms have been well-controlled on Toprol 50 mg once daily which will be continued.  Her CHA2DS2-VASc score is at least 3 and thus I recommend continuing long-term anticoagulation with Eliquis.  I reviewed most recent labs in February which showed normal renal function and normal CBC.  She never had an echocardiogram done and this was requested today.  2.  Hyperlipidemia: Currently on simvastatin 10 mg daily.  Most recent lipid profile showed an LDL of 113.    Disposition:   FU with me in 6 months  Signed,  Lorine Bears, MD  06/15/2023 10:53 AM    [facility]

## 2023-07-08 ENCOUNTER — Ambulatory Visit: Payer: Self-pay

## 2023-07-08 ENCOUNTER — Ambulatory Visit
Admission: RE | Admit: 2023-07-08 | Discharge: 2023-07-08 | Disposition: A | Discharge status: Home / Self Care | Payer: 59 | Source: Ambulatory Visit | Attending: Emergency Medicine

## 2023-07-08 VITALS — BP 125/81 | HR 66 | Temp 97.2°F | Resp 15

## 2023-07-08 DIAGNOSIS — L237 Allergic contact dermatitis due to plants, except food: Secondary | ICD-10-CM

## 2023-07-08 MED ORDER — DEXAMETHASONE SODIUM PHOSPHATE 10 MG/ML IJ SOLN
10.0000 mg | Freq: Once | INTRAMUSCULAR | Status: AC
  Administered 2023-07-08: 10 mg via INTRAMUSCULAR

## 2023-07-08 MED ORDER — PREDNISONE 10 MG (21) PO TBPK: ORAL_TABLET | Freq: Every day | ORAL | 0 refills | Status: DC

## 2023-07-08 MED ORDER — CETIRIZINE HCL 5 MG PO TABS: 5.0000 mg | ORAL_TABLET | Freq: Every day | ORAL | 0 refills | Status: DC

## 2023-07-08 NOTE — Telephone Encounter (Addendum)
Chief Complaint: Poison Oak Symptoms: Itchy rash, covering bilateral legs and arms Frequency: constant  Pertinent Negatives: Patient denies SOB, chest pain  Disposition: [] ED /[x] Urgent Care (no appt availability in office) / [] Appointment(In office/virtual)/ []  [place] [facility]/ [] Home Care/ [] Refused Recommended Disposition /[] [place] [facility]/ []  Follow-up with PCP Additional Notes: Patient states she was exposed to poison oak from petting the neighbors dog that may have had It on their fur. Patient reports it is covering bilateral legs and bilateral arms mainly at the elbows and knees. Patient states it is very itchy and has been using hydrocortisone that has not decreased the itching. Patient states the last time she was exposed she got a shot at a walk in clinic. Care advice was given. No appointments available in the office today. Patient has been scheduled at Urgent Care today at 1430. Advised patient to call back if symptoms get worse. Summary: poison oak   Pt has poison oak and it is spreading.  She ask if she could get something for it.  There are no appts today.  CB@  [phone]     Reason for Disposition  Rash involves more than 20% of the body  Answer Assessment - Initial Assessment Questions 1. APPEARANCE of RASH: "Describe the rash."      Red bumpy 2. LOCATION: "Where is the rash located?"  (e.g., face, genitals, hands, legs)     Both legs and both arms 3. SIZE: "How large is the rash?"      Covering most of the legs and arms 4. ONSET: "When did the rash begin?"      Monday 5. ITCHING: "Does the rash itch?" If Yes, ask: "How bad is it?"   - MILD - doesn't interfere with normal activities   - MODERATE-SEVERE: interferes with work, school, sleep, or other activities      Moderate to Severe 6. EXPOSURE:  "How were you exposed to the plant (poison ivy, poison oak, sumac)"  "When were you exposed?"       I think from touching the dogs and the plant was on their  fur  7. PAST HISTORY: "Have you had a poison ivy rash before?" If Yes, ask: "How bad was it?"     Yes it was several years ago and I got a shot at urgent care  Protocols used: Poison Ivy - Oak - [facility]

## 2023-07-08 NOTE — ED Triage Notes (Signed)
Patient to Urgent Care with complaints of rash present to bilateral arms/ legs. Reports she was petting her neighbors dogs and was exposed to poison ivy.   Symptoms started on Monday.

## 2023-07-08 NOTE — Discharge Instructions (Addendum)
You were given an injection of a steroid called dexamethasone.  Start the prednisone taper tomorrow as directed.    Take Zyrtec as directed.    Follow up with your primary care provider.

## 2023-07-08 NOTE — Telephone Encounter (Signed)
Called pt to give her an appt but she said that she just set up one at urgent care.

## 2023-07-08 NOTE — ED Provider Notes (Signed)
Renaldo Fiddler    CSN: [id number] Arrival date & time: 07/08/23  1414      History   Chief Complaint Chief Complaint  Patient presents with   Rash    Poison [place] - Entered by patient    HPI Lisa Caldwell is a 77 y.o. female.  Patient presents with poison ivy rash x 5 days.  She was exposed to poison ivy after petting her neighbor's dog.  The rash started on her leg but has spread to her other leg and her arm.  It is pruritic and not painful.  No fever, chills, or other symptoms.  Treating with topical anti-itch cream.  The history is provided by the patient and medical records.    Past Medical History:  Diagnosis Date   Allergy    Anxiety    Arthritis    Cancer (HCC)    colon cancer   Depression    Dysrhythmia    GERD (gastroesophageal reflux disease)    Headache(784.0)    Hyperlipidemia    Hypertension    Inflammatory polyps of colon with rectal bleeding (HCC) 2008    rt colectomy for a polyp with carcinoma in situ    Personal history of malignant neoplasm of large intestine    PONV (postoperative nausea and vomiting)     Patient Active Problem List   Diagnosis Date Noted   Paroxysmal atrial fibrillation (HCC) 08/17/2021   Encounter for Medicare annual wellness exam 06/01/2021   Annual physical exam 06/01/2021   Encounter for vitamin deficiency screening 06/01/2021   Primary osteoarthritis of right knee 06/01/2021   Vitamin B 12 deficiency 06/01/2021   Hypercholesterolemia 06/01/2021   Encounter for cardiac risk counseling 06/01/2021   Chronic allergic rhinitis 06/01/2021   Recurrent major depressive disorder, in full remission (HCC) 05/26/2020   Right facial numbness 05/19/2018   Alcohol abuse 05/16/2016   Osteopenia 04/06/2016   History of thyroglossal duct cyst removal 09/04/2015   Persistent thyroglossal duct cyst 07/25/2015   Adaptation reaction 03/04/2015   Colon, diverticulosis 03/04/2015   Elevated blood sugar 03/04/2015   Acid reflux  03/04/2015   High potassium 03/04/2015   Hammer toe 03/04/2015   Headache, migraine 03/04/2015   Allergic rhinitis 04/03/2013   Clinical depression 04/03/2013   History of colon cancer 01/31/2013    Past Surgical History:  Procedure Laterality Date   ABDOMINAL HYSTERECTOMY  10/18/1994   APPENDECTOMY     COLECTOMY Right 10/18/2006   COLONOSCOPY  10/18/2010   Dr. Evette Cristal   COLONOSCOPY WITH PROPOFOL N/A 03/10/2016   Procedure: COLONOSCOPY WITH PROPOFOL;  Surgeon: Kieth Brightly, MD;  Location: [facility] ENDOSCOPY;  Service: Endoscopy;  Laterality: N/A;   COLONOSCOPY WITH PROPOFOL N/A 03/09/2021   Procedure: COLONOSCOPY WITH PROPOFOL;  Surgeon: Wyline Mood, MD;  Location: [facility] ENDOSCOPY;  Service: Gastroenterology;  Laterality: N/A;   dislocated shoulder  Right    placed back into the socket   FOOT SURGERY Left 10/18/2010   FOOT SURGERY Left 10/19/2011   THYROGLOSSAL DUCT CYST N/A 09/04/2015   Procedure: THYROGLOSSAL DUCT CYST;  Surgeon: Bud Face, MD;  Location: [facility] ORS;  Service: ENT;  Laterality: N/A;    OB History     Gravida  2   Para  2   Term      Preterm      AB      Living  2      SAB      IAB      Ectopic  Multiple      Live Births           Obstetric Comments   First pregnancy 16 First menstrual  12          Home Medications    Prior to Admission medications   Medication Sig Start Date End Date Taking? Authorizing Provider  cetirizine (ZYRTEC) 5 MG tablet Take 1 tablet (5 mg total) by mouth at bedtime for 14 days. 07/08/23 07/22/23 Yes Mickie Bail, NP  predniSONE (STERAPRED UNI-PAK 21 TAB) 10 MG (21) TBPK tablet Take by mouth daily. As directed 07/09/23  Yes Mickie Bail, NP  apixaban (ELIQUIS) 5 MG TABS tablet Take 1 tablet (5 mg total) by mouth 2 (two) times daily. 02/02/23   Berniece Salines, FNP  Ascorbic Acid (VITAMIN C) 1000 MG tablet Take 1,000 mg by mouth daily.    [provider]  Biotin 5000 MCG TABS Take 1  tablet by mouth daily.    [provider]  Calcium Carb-Cholecalciferol (CALCIUM PLUS VITAMIN D3) 600-20 MG-MCG TABS Take 1 tablet by mouth daily.    [provider]  chlorpheniramine (CHLOR-TRIMETON) 4 MG tablet Take 4 mg by mouth 2 (two) times daily as needed for allergies.    [provider]  citalopram (CELEXA) 20 MG tablet Take 1 tablet (20 mg total) by mouth daily. 02/04/23   Berniece Salines, FNP  diphenhydrAMINE (BENADRYL) 25 MG tablet Take 25 mg by mouth daily.    [provider]  fluticasone (FLONASE) 50 MCG/ACT nasal spray USE 2 SPRAYS IN BOTH  NOSTRILS DAILY 02/02/23   Berniece Salines, FNP  metoprolol succinate (TOPROL-XL) 25 MG 24 hr tablet Take 2 tablets (50 mg total) by mouth daily. 02/02/23   Berniece Salines, FNP  Misc Natural Products (OSTEO BI-FLEX TRIPLE STRENGTH PO) Take 1 tablet by mouth daily.    [provider]  Multiple Vitamin (MULTIVITAMIN) tablet Take 1 tablet by mouth daily.    [provider]  omeprazole (PRILOSEC) 20 MG capsule Take 1 capsule (20 mg total) by mouth daily. 02/02/23   Berniece Salines, FNP  simvastatin (ZOCOR) 10 MG tablet Take 1 tablet (10 mg total) by mouth at bedtime. 02/02/23   Berniece Salines, FNP  vitamin B-12 (CYANOCOBALAMIN) 1000 MCG tablet Take 1 tablet (1,000 mcg total) by mouth daily. Patient taking differently: Take 1,000 mcg by mouth 3 (three) times a week. 04/27/22   Corky Downs, MD  Vitamin D-Vitamin K (K2-D3 5000) 5000-90 UNIT-MCG CAPS Take 1 capsule by mouth daily.    [provider]    Family History Family History  Problem Relation Age of Onset   Heart disease Mother    Heart disease Father    Drug abuse Brother    Breast cancer Neg Hx     Social History Social History   Tobacco Use   Smoking status: Never   Smokeless tobacco: Never  Vaping Use   Vaping status: Never Used  Substance Use Topics   Alcohol use: Yes    Comment: 2 a day   Drug use: No      Allergies   Codeine, Peanut-containing drug products, and Phenergan [promethazine hcl]   Review of Systems Review of Systems  Constitutional:  Negative for chills and fever.  HENT:  Negative for sore throat, trouble swallowing and voice change.   Respiratory:  Negative for cough and shortness of breath.   Skin:  Positive for color change and rash.  Physical Exam Triage Vital Signs ED Triage Vitals  Encounter Vitals Group     BP 07/08/23 1446 125/81     Systolic BP Percentile --      Diastolic BP Percentile --      Pulse Rate 07/08/23 1446 66     Resp 07/08/23 1446 15     Temp 07/08/23 1446 (!) 97.2 F (36.2 C)     Temp src --      SpO2 07/08/23 1446 95 %     Weight --      Height --      Head Circumference --      Peak Flow --      Pain Score 07/08/23 1440 0     Pain Loc --      Pain Education --      Exclude from Growth Chart --    No data found.  Updated Vital Signs BP 125/81   Pulse 66   Temp (!) 97.2 F (36.2 C)   Resp 15   SpO2 95%   Visual Acuity Right Eye Distance:   Left Eye Distance:   Bilateral Distance:    Right Eye Near:   Left Eye Near:    Bilateral Near:     Physical Exam Constitutional:      General: She is not in acute distress. HENT:     Mouth/Throat:     Mouth: Mucous membranes are moist.  Cardiovascular:     Rate and Rhythm: Normal rate and regular rhythm.  Pulmonary:     Effort: Pulmonary effort is normal. No respiratory distress.  Skin:    General: Skin is warm and dry.     Findings: Rash present.     Comments: Scattered erythematous vesicular and papular patchy rash on arms and legs.  Neurological:     Mental Status: She is alert.      UC Treatments / Results  Labs (all labs ordered are listed, but only abnormal results are displayed) Labs Reviewed - No data to display  EKG   Radiology No results found.  Procedures Procedures (including critical care time)  Medications Ordered in UC Medications   dexamethasone (DECADRON) injection 10 mg (10 mg Intramuscular Given 07/08/23 1456)    Initial Impression / Assessment and Plan / UC Course  I have reviewed the triage vital signs and the nursing notes.  Pertinent labs & imaging results that were available during my care of the patient were reviewed by me and considered in my medical decision making (see chart for details).    Poison ivy dermatitis.  Dexamethasone injection given here per patient preference.  Starting prednisone taper tomorrow.  Treating with Zyrtec also.  Education provided on poison ivy dermatitis.  Instructed patient to follow-up with her PCP.  She agrees to plan of care.  Final Clinical Impressions(s) / UC Diagnoses   Final diagnoses:  Poison ivy dermatitis     Discharge Instructions      You were given an injection of a steroid called dexamethasone.  Start the prednisone taper tomorrow as directed.    Take Zyrtec as directed.    Follow up with your primary care provider.       ED Prescriptions     Medication Sig Dispense Auth. Provider   predniSONE (STERAPRED UNI-PAK 21 TAB) 10 MG (21) TBPK tablet Take by mouth daily. As directed 21 tablet Mickie Bail, NP   cetirizine (ZYRTEC) 5 MG tablet Take 1 tablet (5 mg total) by mouth at  bedtime for 14 days. 14 tablet Mickie Bail, NP      PDMP not reviewed this encounter.   Mickie Bail, NP 07/08/23 1505

## 2023-07-13 ENCOUNTER — Ambulatory Visit: Payer: 59 | Attending: Cardiovascular Disease

## 2023-07-13 DIAGNOSIS — I480 Paroxysmal atrial fibrillation: Secondary | ICD-10-CM

## 2023-07-13 LAB — ECHOCARDIOGRAM COMPLETE
Area-P 1/2: 3.48 cm2
S' Lateral: 3.2 cm

## 2023-10-06 ENCOUNTER — Other Ambulatory Visit: Payer: Self-pay | Admitting: Nurse Practitioner

## 2023-10-06 DIAGNOSIS — E7800 Pure hypercholesterolemia, unspecified: Secondary | ICD-10-CM

## 2023-10-07 ENCOUNTER — Other Ambulatory Visit: Payer: Self-pay | Admitting: Nurse Practitioner

## 2023-10-07 NOTE — Telephone Encounter (Signed)
Requested Prescriptions  Pending Prescriptions Disp Refills   simvastatin (ZOCOR) 10 MG tablet [Pharmacy Med Name: Simvastatin 10 MG Oral Tablet] 100 tablet 2    Sig: TAKE 1 TABLET BY MOUTH AT  BEDTIME     Cardiovascular:  Antilipid - Statins Failed - 10/07/2023 10:30 AM      Failed - Lipid Panel in normal range within the last 12 months    Cholesterol, Total  Date Value Ref Range Status  06/12/2021 201 (H) 100 - 199 mg/dL Final   Cholesterol  Date Value Ref Range Status  07/23/2022 195 <200 mg/dL Final   LDL Cholesterol (Calc)  Date Value Ref Range Status  07/23/2022 113 (H) mg/dL (calc) Final    Comment:    Reference range: <100 . Desirable range <100 mg/dL for primary prevention;   <70 mg/dL for patients with CHD or diabetic patients  with > or = 2 CHD risk factors. Marland Kitchen LDL-C is now calculated using the Martin-Hopkins  calculation, which is a validated novel method providing  better accuracy than the Friedewald equation in the  estimation of LDL-C.  Horald Pollen et al. Lenox Ahr. 1610;960(45): 2061-2068  (http://education.[facility].com/faq/FAQ164)    HDL  Date Value Ref Range Status  07/23/2022 64 > OR = 50 mg/dL Final  40/98/1191 77 >47 mg/dL Final   Triglycerides  Date Value Ref Range Status  07/23/2022 79 <150 mg/dL Final         Passed - Patient is not pregnant      Passed - Valid encounter within last 12 months    Recent Outpatient Visits           4 months ago Hypertension, unspecified type   [facility] Margarita Mail, DO   6 months ago Paroxysmal atrial fibrillation [facility])   [employer] [facility] Mecum, Erin E, PA-C   10 months ago Hypertension, unspecified type   [facility] Margarita Mail, DO   2 years ago Encounter for [employer] annual wellness exam   [facility] Jacky Kindle, FNP   3 years ago Annual physical exam   [facility] [place], [place], [place]       Future Appointments             In 1 month Margarita Mail, DO [facility], [facility]

## 2023-10-07 NOTE — Telephone Encounter (Signed)
Requested Prescriptions  Pending Prescriptions Disp Refills   metoprolol succinate (TOPROL-XL) 25 MG 24 hr tablet [Pharmacy Med Name: Metoprolol Succinate ER 25 MG Oral Tablet Extended Release 24 Hour] 200 tablet 2    Sig: TAKE 2 TABLETS BY MOUTH DAILY     Cardiovascular:  Beta Blockers Passed - 10/07/2023 12:40 PM      Passed - Last BP in normal range    BP Readings from Last 1 Encounters:  07/08/23 125/81         Passed - Last Heart Rate in normal range    Pulse Readings from Last 1 Encounters:  07/08/23 66         Passed - Valid encounter within last 6 months    Recent Outpatient Visits           4 months ago Hypertension, unspecified type   [facility] Margarita Mail, DO   6 months ago Paroxysmal atrial fibrillation [facility])   [facility] Mecum, Erin E, PA-C   10 months ago Hypertension, unspecified type   [facility] Margarita Mail, DO   2 years ago Encounter for [employer] annual wellness exam   [facility] Jacky Kindle, FNP   3 years ago Annual physical exam   [facility] [place], [place], [place]       Future Appointments             In 1 month Margarita Mail, DO [facility], [facility]

## 2023-10-08 ENCOUNTER — Other Ambulatory Visit: Payer: Self-pay | Admitting: Nurse Practitioner

## 2023-10-08 DIAGNOSIS — I480 Paroxysmal atrial fibrillation: Secondary | ICD-10-CM

## 2023-10-11 NOTE — Telephone Encounter (Signed)
Requested Prescriptions  Pending Prescriptions Disp Refills   ELIQUIS 5 MG TABS tablet [Pharmacy Med Name: Eliquis 5 MG Oral Tablet] 200 tablet 2    Sig: TAKE 1 TABLET BY MOUTH TWICE  DAILY     Hematology:  Anticoagulants - apixaban Passed - 10/11/2023  8:34 AM      Passed - PLT in normal range and within 360 days    Platelets  Date Value Ref Range Status  12/16/2022 257 150 - 400 K/uL Final  06/12/2021 248 150 - 450 x10E3/uL Final         Passed - HGB in normal range and within 360 days    Hemoglobin  Date Value Ref Range Status  12/16/2022 13.2 12.0 - 15.0 g/dL Final  40/98/1191 47.8 11.1 - 15.9 g/dL Final         Passed - HCT in normal range and within 360 days    HCT  Date Value Ref Range Status  12/16/2022 40.2 36.0 - 46.0 % Final   Hematocrit  Date Value Ref Range Status  06/12/2021 39.4 34.0 - 46.6 % Final         Passed - Cr in normal range and within 360 days    Creat  Date Value Ref Range Status  07/23/2022 0.74 0.60 - 1.00 mg/dL Final   Creatinine, Ser  Date Value Ref Range Status  12/16/2022 0.88 0.44 - 1.00 mg/dL Final         Passed - AST in normal range and within 360 days    AST  Date Value Ref Range Status  12/16/2022 26 15 - 41 U/L Final   SGOT(AST)  Date Value Ref Range Status  11/29/2011 36 15 - 37 Unit/L Final         Passed - ALT in normal range and within 360 days    ALT  Date Value Ref Range Status  12/16/2022 19 0 - 44 U/L Final   SGPT (ALT)  Date Value Ref Range Status  11/29/2011 44 U/L Final    Comment:    12-78 NOTE: NEW REFERENCE RANGE 09/10/2011          Passed - Valid encounter within last 12 months    Recent Outpatient Visits           4 months ago Hypertension, unspecified type   [facility] Margarita Mail, DO   6 months ago Paroxysmal atrial fibrillation [facility])   [facility] Mecum, Oswaldo Conroy, PA-C   10 months ago Hypertension, unspecified type   [facility] Margarita Mail, DO   2 years ago Encounter for [employer] annual wellness exam   [facility] Jacky Kindle, FNP   3 years ago Annual physical exam   [facility] [place], [place], [place]       Future Appointments             In 1 month Margarita Mail, DO [facility], [facility]

## 2023-11-28 ENCOUNTER — Other Ambulatory Visit: Payer: Self-pay

## 2023-11-28 ENCOUNTER — Encounter: Payer: Self-pay | Admitting: Internal Medicine

## 2023-11-28 ENCOUNTER — Ambulatory Visit (INDEPENDENT_AMBULATORY_CARE_PROVIDER_SITE_OTHER): Payer: 59 | Admitting: Internal Medicine

## 2023-11-28 VITALS — BP 120/70 | HR 81 | Temp 98.1°F | Resp 16 | Ht 64.0 in | Wt 156.2 lb

## 2023-11-28 DIAGNOSIS — I10 Essential (primary) hypertension: Secondary | ICD-10-CM | POA: Diagnosis not present

## 2023-11-28 DIAGNOSIS — F3342 Major depressive disorder, recurrent, in full remission: Secondary | ICD-10-CM

## 2023-11-28 DIAGNOSIS — K219 Gastro-esophageal reflux disease without esophagitis: Secondary | ICD-10-CM

## 2023-11-28 DIAGNOSIS — R718 Other abnormality of red blood cells: Secondary | ICD-10-CM

## 2023-11-28 DIAGNOSIS — I480 Paroxysmal atrial fibrillation: Secondary | ICD-10-CM | POA: Diagnosis not present

## 2023-11-28 DIAGNOSIS — M8589 Other specified disorders of bone density and structure, multiple sites: Secondary | ICD-10-CM | POA: Diagnosis not present

## 2023-11-28 DIAGNOSIS — F1010 Alcohol abuse, uncomplicated: Secondary | ICD-10-CM

## 2023-11-28 DIAGNOSIS — E538 Deficiency of other specified B group vitamins: Secondary | ICD-10-CM | POA: Diagnosis not present

## 2023-11-28 DIAGNOSIS — E559 Vitamin D deficiency, unspecified: Secondary | ICD-10-CM

## 2023-11-28 DIAGNOSIS — E7800 Pure hypercholesterolemia, unspecified: Secondary | ICD-10-CM

## 2023-11-28 NOTE — Assessment & Plan Note (Signed)
 Stable, taking Prilosec 20 mg daily.

## 2023-11-28 NOTE — Progress Notes (Signed)
 Established Patient Office Visit  Subjective   Patient ID: Lisa Caldwell, female    DOB: Sep 30, 1946  Age: 78 y.o. MRN: [id number]  Chief Complaint  Patient presents with   Medical Management of Chronic Issues    6 month recheck    HPI  LEDIA FINUCANE presents to follow up on chronic medical conditions. Patient is doing well and reports no issues today. Lisa Caldwell did have a nurse visit from her insurance who recommended we discuss her alcohol use today. Patient has been drinking 3-4 beers a night for several years. No desire to cut back or quit.   Hypertension/A.Fib: -Medications: Metoprolol  XL 25 mg BID, Eliquis  5 mg BID Lisa Caldwell denies recent palpitation, chest pain, tachycardia or concerns with A.fib  -Patient is compliant with above medications and reports no side effects. -Denies any SOB, CP, vision changes, LE edema or symptoms of hypotension  HLD: -Medications: Zocor  10 mg -Patient is compliant with above medications and reports no side effects.  -Last lipid panel:Lipid Panel     Component Value Date/Time   CHOL 195 07/23/2022 0934   CHOL 201 (H) 06/12/2021 1052   TRIG 79 07/23/2022 0934   HDL 64 07/23/2022 0934   HDL 77 06/12/2021 1052   CHOLHDL 3.0 07/23/2022 0934   LDLCALC 113 (H) 07/23/2022 0934   LABVLDL 14 06/12/2021 1052    MDD: -Mood status: stable -Current treatment: Celexa  20 mg -Satisfied with current treatment?: yes -Duration of current treatment : years -Side effects: no -Medication compliance: excellent compliance     11/28/2023    8:33 AM 05/27/2023   12:56 PM 03/28/2023    1:08 PM 02/24/2023    2:13 PM 11/25/2022   10:52 AM  Depression screen PHQ 2/9  Decreased Interest 0 0 0 0 0  Down, Depressed, Hopeless 0 0 0 0 0  PHQ - 2 Score 0 0 0 0 0  Altered sleeping  0 0  0  Tired, decreased energy  0 0  0  Change in appetite  0 0  0  Feeling bad or failure about yourself   0 0  0  Trouble concentrating  0 0  0  Moving slowly or fidgety/restless  0 0  0   Suicidal thoughts  0 0  0  PHQ-9 Score  0 0  0  Difficult doing work/chores  Not difficult at all   Not difficult at all   GERD: -Currently on Prilosec 20 mg, taking daily, denies symptoms    Vitamin D /Vitamin B12 Deficiency: -Currently on Vitamin B12 1000 mcg 3 times a week, Vitamin D  and K2 daily   History of Colon Cancer:  -Diagnosed with cancerous colon polyp in 2008, underwent partial colectomy at the time. Did not need radiation/chemotherapy.  -Colonoscopy in 2022 negative, was told Lisa Caldwell did not need further screening   Health Maintenance: -Blood work due -Mammogram 7/24 Birads-1 -DEXA 7/24 stable, osteopenia in the right hip and left forearm - taking vitamin d  and calcium, also doing aerobics and lift weights, [place] chi as regular exercise   Patient Active Problem List   Diagnosis Date Noted   Hypertension 11/28/2023   Vitamin D  deficiency 11/28/2023   Paroxysmal atrial fibrillation (HCC) 08/17/2021   Encounter for Medicare annual wellness exam 06/01/2021   Annual physical exam 06/01/2021   Encounter for vitamin deficiency screening 06/01/2021   Primary osteoarthritis of right knee 06/01/2021   Vitamin B12 deficiency 06/01/2021   Hypercholesterolemia 06/01/2021   Encounter for cardiac risk  counseling 06/01/2021   Chronic allergic rhinitis 06/01/2021   Recurrent major depressive disorder, in full remission (HCC) 05/26/2020   Right facial numbness 05/19/2018   Alcohol abuse 05/16/2016   Osteopenia 04/06/2016   History of thyroglossal duct cyst removal 09/04/2015   Persistent thyroglossal duct cyst 07/25/2015   Adaptation reaction 03/04/2015   Colon, diverticulosis 03/04/2015   Elevated blood sugar 03/04/2015   Acid reflux 03/04/2015   High potassium 03/04/2015   Hammer toe 03/04/2015   Headache, migraine 03/04/2015   Allergic rhinitis 04/03/2013   Clinical depression 04/03/2013   History of colon cancer 01/31/2013   Past Medical History:  Diagnosis Date    Allergy    Anxiety    Arthritis    Cancer (HCC)    colon cancer   Depression    Dysrhythmia    GERD (gastroesophageal reflux disease)    Headache(784.0)    Hyperlipidemia    Hypertension    Inflammatory polyps of colon with rectal bleeding (HCC) 2008    rt colectomy for a polyp with carcinoma in situ    Personal history of malignant neoplasm of large intestine    PONV (postoperative nausea and vomiting)    Past Surgical History:  Procedure Laterality Date   ABDOMINAL HYSTERECTOMY  10/18/1994   APPENDECTOMY     COLECTOMY Right 10/18/2006   COLONOSCOPY  10/18/2010   Dr. Lorel Roes   COLONOSCOPY WITH PROPOFOL  N/A 03/10/2016   Procedure: COLONOSCOPY WITH PROPOFOL ;  Surgeon: Jerlean Mood, MD;  Location: [facility] ENDOSCOPY;  Service: Endoscopy;  Laterality: N/A;   COLONOSCOPY WITH PROPOFOL  N/A 03/09/2021   Procedure: COLONOSCOPY WITH PROPOFOL ;  Surgeon: Luke Salaam, MD;  Location: [facility] ENDOSCOPY;  Service: Gastroenterology;  Laterality: N/A;   dislocated shoulder  Right    placed back into the socket   FOOT SURGERY Left 10/18/2010   FOOT SURGERY Left 10/19/2011   THYROGLOSSAL DUCT CYST N/A 09/04/2015   Procedure: THYROGLOSSAL DUCT CYST;  Surgeon: Rogers Clayman, MD;  Location: [facility] ORS;  Service: ENT;  Laterality: N/A;   Social History   Tobacco Use   Smoking status: Never   Smokeless tobacco: Never  Vaping Use   Vaping status: Never Used  Substance Use Topics   Alcohol use: Yes    Comment: 2 a day   Drug use: No   Social History   Socioeconomic History   Marital status: Married    Spouse name: Tom   Number of children: 2   Years of education: Not on file   Highest education level: GED or equivalent  Occupational History   Occupation: Retired  Tobacco Use   Smoking status: Never   Smokeless tobacco: Never  Vaping Use   Vaping status: Never Used  Substance and Sexual Activity   Alcohol use: Yes    Comment: 2 a day   Drug use: No   Sexual activity: Not  Currently    Birth control/protection: Other-see comments    Comment: hysterectomy  Other Topics Concern   Not on file  Social History Narrative   Not on file   Social Drivers of Health   Financial Resource Strain: Low Risk  (11/24/2023)   Overall Financial Resource Strain (CARDIA)    Difficulty of Paying Living Expenses: Not hard at all  Food Insecurity: No Food Insecurity (11/24/2023)   Hunger Vital Sign    Worried About Running Out of Food in the Last Year: Never true    Ran Out of Food in the Last Year: Never true  Transportation Needs: No Transportation Needs (11/24/2023)   PRAPARE - Administrator, Civil Service (Medical): No    Lack of Transportation (Non-Medical): No  Physical Activity: Sufficiently Active (11/24/2023)   Exercise Vital Sign    Days of Exercise per Week: 5 days    Minutes of Exercise per Session: 30 min  Stress: No Stress Concern Present (11/24/2023)   [employer] of Occupational Health - Occupational Stress Questionnaire    Feeling of Stress : Not at all  Social Connections: Unknown (11/24/2023)   Social Connection and Isolation Panel [NHANES]    Frequency of Communication with Friends and Family: Once a week    Frequency of Social Gatherings with Friends and Family: More than three times a week    Attends Religious Services: Patient declined    Database administrator or Organizations: No    Attends Banker Meetings: Never    Marital Status: Married  Catering manager Violence: Not At Risk (02/24/2023)   Humiliation, Afraid, Rape, and Kick questionnaire    Fear of Current or Ex-Partner: No    Emotionally Abused: No    Physically Abused: No    Sexually Abused: No   Family Status  Relation Name Status   Mother Stephenie Einstein Deceased at age 48   Father Edsel Buckner Other   Brother Henrine [name] Deceased at age 81       heart disease   Neg Hx  (Not Specified)  No partnership data on file   Family History  Problem Relation Age  of Onset   Heart disease Mother    Heart disease Father    Drug abuse Brother    Breast cancer Neg Hx    Allergies  Allergen Reactions   Codeine Nausea And Vomiting   Peanut-Containing Drug Products Diarrhea and Nausea And Vomiting    Pine nut   Phenergan [Promethazine Hcl] Other (See Comments)    tremors      Review of Systems  All other systems reviewed and are negative.     Objective:     BP 120/70 (Cuff Size: Normal)   Pulse 81   Temp 98.1 F (36.7 C) (Oral)   Resp 16   Ht 5\' 4"  (1.626 m)   Wt 156 lb 3.2 oz (70.9 kg)   BMI 26.81 kg/m  BP Readings from Last 3 Encounters:  11/28/23 120/70  07/08/23 125/81  06/14/23 130/72   Wt Readings from Last 3 Encounters:  11/28/23 156 lb 3.2 oz (70.9 kg)  06/14/23 159 lb 6 oz (72.3 kg)  05/27/23 159 lb 9.6 oz (72.4 kg)      Physical Exam Constitutional:      Appearance: Normal appearance.  HENT:     Head: Normocephalic and atraumatic.     Mouth/Throat:     Mouth: Mucous membranes are moist.     Pharynx: Oropharynx is clear.  Eyes:     Extraocular Movements: Extraocular movements intact.     Conjunctiva/sclera: Conjunctivae normal.     Pupils: Pupils are equal, round, and reactive to light.  Neck:     Comments: No thyromegaly Cardiovascular:     Rate and Rhythm: Normal rate and regular rhythm.  Pulmonary:     Effort: Pulmonary effort is normal.     Breath sounds: Normal breath sounds.  Musculoskeletal:     Cervical back: No tenderness.     Right lower leg: No edema.     Left lower leg: No edema.  Lymphadenopathy:  Cervical: No cervical adenopathy.  Skin:    General: Skin is warm and dry.  Neurological:     General: No focal deficit present.     Mental Status: Lisa Caldwell is alert. Mental status is at baseline.  Psychiatric:        Mood and Affect: Mood normal.        Behavior: Behavior normal.      No results found for any visits on 11/28/23.  Last CBC Lab Results  Component Value Date   WBC 6.8  12/16/2022   HGB 13.2 12/16/2022   HCT 40.2 12/16/2022   MCV 99.0 12/16/2022   MCH 32.5 12/16/2022   RDW 12.3 12/16/2022   PLT 257 12/16/2022   Last metabolic panel Lab Results  Component Value Date   GLUCOSE 133 (H) 12/16/2022   NA 139 12/16/2022   K 3.6 12/16/2022   CL 104 12/16/2022   CO2 27 12/16/2022   BUN 17 12/16/2022   CREATININE 0.88 12/16/2022   GFRNONAA >60 12/16/2022   CALCIUM 9.1 12/16/2022   PROT 7.3 12/16/2022   ALBUMIN 3.7 12/16/2022   LABGLOB 2.6 06/12/2021   AGRATIO 1.7 06/12/2021   BILITOT 0.8 12/16/2022   ALKPHOS 69 12/16/2022   AST 26 12/16/2022   ALT 19 12/16/2022   ANIONGAP 8 12/16/2022   Last lipids Lab Results  Component Value Date   CHOL 195 07/23/2022   HDL 64 07/23/2022   LDLCALC 113 (H) 07/23/2022   TRIG 79 07/23/2022   CHOLHDL 3.0 07/23/2022   Last hemoglobin A1c Lab Results  Component Value Date   HGBA1C 5.4 06/12/2021   Last thyroid  functions Lab Results  Component Value Date   TSH 3.04 07/23/2022   T3TOTAL 95 07/23/2022   T4TOTAL 5.4 07/23/2022   Last vitamin D  Lab Results  Component Value Date   VD25OH 51.7 06/01/2021   Last vitamin B12 and Folate Lab Results  Component Value Date   VITAMINB12 1,652 (H) 06/01/2021      The 10-year ASCVD risk score (Arnett DK, et al., 2019) is: 23.2%    Assessment & Plan:  Hypertension, unspecified type Assessment & Plan: Blood pressure stable here today, no changes made to medications. Labs due.  Orders: -     CBC with Differential/Platelet -     COMPLETE METABOLIC PANEL WITH GFR  Paroxysmal atrial fibrillation (HCC) Assessment & Plan: Stable, on eliquis  and beta blocker.   Orders: -     CBC with Differential/Platelet -     COMPLETE METABOLIC PANEL WITH GFR  Hypercholesterolemia Assessment & Plan: Recheck lipid panel, continue statin.  Orders: -     Lipid panel  Recurrent major depressive disorder, in full remission [facility]) Assessment & Plan: Moods stable,  doing well on Celexa  20 mg.   Gastroesophageal reflux disease without esophagitis Assessment & Plan: Stable, taking Prilosec 20 mg daily.   Osteopenia of multiple sites Assessment & Plan: Taking vitamin D , recheck levels today.   Vitamin D  deficiency Assessment & Plan: On a supplement, recheck levels.  Orders: -     VITAMIN D  25 Hydroxy (Vit-D Deficiency, Fractures)  Vitamin B12 deficiency Assessment & Plan: Now taking 3 times a week, recheck levels.  Orders: -     Vitamin B12  Alcohol abuse Assessment & Plan: Patient currently drinking about 3-4 beers a night, discussed that this increase cancer risk, cognitive decline and could interact with her Eliquis . Patient shows no desire to decrease or quit, will continue to monitor.  Return in about 6 months (around 05/27/2024).    Rockney Cid, DO

## 2023-11-28 NOTE — Assessment & Plan Note (Signed)
 Stable, on eliquis  and beta blocker.

## 2023-11-28 NOTE — Assessment & Plan Note (Signed)
 Blood pressure stable here today, no changes made to medications. Labs due.

## 2023-11-28 NOTE — Assessment & Plan Note (Signed)
 Moods stable, doing well on Celexa  20 mg.

## 2023-11-28 NOTE — Assessment & Plan Note (Signed)
 Patient currently drinking about 3-4 beers a night, discussed that this increase cancer risk, cognitive decline and could interact with her Eliquis . Patient shows no desire to decrease or quit, will continue to monitor.

## 2023-11-28 NOTE — Assessment & Plan Note (Signed)
 Taking vitamin D , recheck levels today.

## 2023-11-28 NOTE — Assessment & Plan Note (Signed)
 Recheck lipid panel, continue statin

## 2023-11-28 NOTE — Assessment & Plan Note (Signed)
 Now taking 3 times a week, recheck levels.

## 2023-11-28 NOTE — Assessment & Plan Note (Signed)
 On a supplement, recheck levels.

## 2023-11-29 LAB — COMPLETE METABOLIC PANEL WITH GFR
AG Ratio: 1.5 (calc) (ref 1.0–2.5)
ALT: 14 U/L (ref 6–29)
AST: 19 U/L (ref 10–35)
Albumin: 4.2 g/dL (ref 3.6–5.1)
Alkaline phosphatase (APISO): 68 U/L (ref 37–153)
BUN: 13 mg/dL (ref 7–25)
CO2: 28 mmol/L (ref 20–32)
Calcium: 9.5 mg/dL (ref 8.6–10.4)
Chloride: 106 mmol/L (ref 98–110)
Creat: 0.72 mg/dL (ref 0.60–1.00)
Globulin: 2.8 g/dL (ref 1.9–3.7)
Glucose, Bld: 102 mg/dL — ABNORMAL HIGH (ref 65–99)
Potassium: 4.2 mmol/L (ref 3.5–5.3)
Sodium: 140 mmol/L (ref 135–146)
Total Bilirubin: 0.5 mg/dL (ref 0.2–1.2)
Total Protein: 7 g/dL (ref 6.1–8.1)
eGFR: 86 mL/min/{1.73_m2} (ref >=60)

## 2023-11-29 LAB — CBC WITH DIFFERENTIAL/PLATELET
Absolute Lymphocytes: 1668 {cells}/uL (ref 850–3900)
Absolute Monocytes: 492 {cells}/uL (ref 200–950)
Basophils Absolute: 48 {cells}/uL (ref 0–200)
Basophils Relative: 0.8 %
Eosinophils Absolute: 222 {cells}/uL (ref 15–500)
Eosinophils Relative: 3.7 %
HCT: 39.7 % (ref 35.0–45.0)
Hemoglobin: 12.8 g/dL (ref 11.7–15.5)
MCH: 32.7 pg (ref 27.0–33.0)
MCHC: 32.2 g/dL (ref 32.0–36.0)
MCV: 101.5 fL — ABNORMAL HIGH (ref 80.0–100.0)
MPV: 10.5 fL (ref 7.5–12.5)
Monocytes Relative: 8.2 %
Neutro Abs: 3570 {cells}/uL (ref 1500–7800)
Neutrophils Relative %: 59.5 %
Platelets: 242 10*3/uL (ref 140–400)
RBC: 3.91 10*6/uL (ref 3.80–5.10)
RDW: 11.5 % (ref 11.0–15.0)
Total Lymphocyte: 27.8 %
WBC: 6 10*3/uL (ref 3.8–10.8)

## 2023-11-29 LAB — VITAMIN B12: Vitamin B-12: 727 pg/mL (ref 200–1100)

## 2023-11-29 LAB — VITAMIN D 25 HYDROXY (VIT D DEFICIENCY, FRACTURES): Vit D, 25-Hydroxy: 107 ng/mL — ABNORMAL HIGH (ref 30–100)

## 2023-11-29 LAB — LIPID PANEL
Cholesterol: 192 mg/dL (ref <200)
HDL: 68 mg/dL (ref >=50)
LDL Cholesterol (Calc): 104 mg/dL — ABNORMAL HIGH
Non-HDL Cholesterol (Calc): 124 mg/dL (ref <130)
Total CHOL/HDL Ratio: 2.8 (calc) (ref <5.0)
Triglycerides: 106 mg/dL (ref <150)

## 2023-11-29 NOTE — Addendum Note (Signed)
Addended by: Margarita Mail on: 11/29/2023 01:10 PM   Modules accepted: Orders

## 2023-12-11 ENCOUNTER — Other Ambulatory Visit: Payer: Self-pay | Admitting: Nurse Practitioner

## 2023-12-11 ENCOUNTER — Other Ambulatory Visit: Payer: Self-pay | Admitting: Internal Medicine

## 2023-12-11 DIAGNOSIS — J309 Allergic rhinitis, unspecified: Secondary | ICD-10-CM

## 2023-12-11 DIAGNOSIS — F3342 Major depressive disorder, recurrent, in full remission: Secondary | ICD-10-CM

## 2023-12-13 NOTE — Telephone Encounter (Signed)
 Requested medication (s) are due for refill today: yes  Requested medication (s) are on the active medication list: yes  Last refill:  10/07/23 #200 0 refills  Future visit scheduled: yes in 5 months.  Notes to clinic:  To pharmacy: Please send a replace/new response with 100-Day Supply if appropriate to maximize member benefit. Requesting 1 year supply.  Do you want to refill for 1 year?     Requested Prescriptions  Pending Prescriptions Disp Refills   metoprolol succinate (TOPROL-XL) 25 MG 24 hr tablet [Pharmacy Med Name: Metoprolol Succinate ER 25 MG Oral Tablet Extended Release 24 Hour] 200 tablet 2    Sig: TAKE 2 TABLETS BY MOUTH DAILY     Cardiovascular:  Beta Blockers Failed - 12/13/2023 10:20 AM      Failed - Valid encounter within last 6 months    Recent Outpatient Visits           6 months ago Hypertension, unspecified type   [facility] Margarita Mail, DO   8 months ago Paroxysmal atrial fibrillation [facility])   [facility] Mecum, Oswaldo Conroy, PA-C   1 year ago Hypertension, unspecified type   [facility] Margarita Mail, DO   2 years ago Encounter for [employer] annual wellness exam   [facility] Jacky Kindle, FNP   3 years ago Annual physical exam   [facility] [place], Al[place]andra [place], [place]       Future Appointments             In 5 months Margarita Mail, DO  [facility]            Passed - Last BP in normal range    BP Readings from Last 1 Encounters:  11/28/23 120/70         Passed - Last Heart Rate in normal range    Pulse Readings from Last 1 Encounters:  11/28/23 81

## 2023-12-13 NOTE — Telephone Encounter (Signed)
 Requested Prescriptions  Pending Prescriptions Disp Refills   fluticasone (FLONASE) 50 MCG/ACT nasal spray [Pharmacy Med Name: Fluticasone Propionate 50 MCG/ACT Nasal Suspension] 64 g 0    Sig: USE 2 SPRAYS IN BOTH NOSTRILS  DAILY     Ear, Nose, and Throat: Nasal Preparations - Corticosteroids Passed - 12/13/2023 10:22 AM      Passed - Valid encounter within last 12 months    Recent Outpatient Visits           6 months ago Hypertension, unspecified type   [facility] Margarita Mail, DO   8 months ago Paroxysmal atrial fibrillation [facility])   [place] [facility] Mecum, Oswaldo Conroy, PA-C   1 year ago Hypertension, unspecified type   [facility] Margarita Mail, DO   2 years ago Encounter for [employer] annual wellness exam   [facility] Merita Norton T, FNP   3 years ago Annual physical exam   [facility] [place], [place], [place]       Future Appointments             In 5 months Margarita Mail, DO [facility]             citalopram (CELEXA) 20 MG tablet [Pharmacy Med Name: Citalopram Hydrobromide 20 MG Oral Tablet] 90 tablet 0    Sig: TAKE 1 TABLET BY MOUTH DAILY     Psychiatry:  Antidepressants - SSRI Failed - 12/13/2023 10:22 AM      Failed - Valid encounter within last 6 months    Recent Outpatient Visits           6 months ago Hypertension, unspecified type   [facility] Margarita Mail, DO   8 months ago Paroxysmal atrial fibrillation [facility])   [place] [facility] Mecum, Oswaldo Conroy, PA-C   1 year ago Hypertension, unspecified type   [facility] Margarita Mail, DO   2 years ago Encounter for [employer] annual wellness exam   [facility] Jacky Kindle, FNP   3 years ago Annual physical exam   [facility] [place], [place], [place]       Future Appointments             In 5 months Margarita Mail, DO [place] [facility], [facility]            Passed - Completed PHQ-2 or PHQ-9 in the last 360 days

## 2024-02-17 ENCOUNTER — Other Ambulatory Visit: Payer: Self-pay | Admitting: Nurse Practitioner

## 2024-02-17 DIAGNOSIS — K219 Gastro-esophageal reflux disease without esophagitis: Secondary | ICD-10-CM

## 2024-02-21 ENCOUNTER — Other Ambulatory Visit: Payer: Self-pay | Admitting: Nurse Practitioner

## 2024-02-21 DIAGNOSIS — J309 Allergic rhinitis, unspecified: Secondary | ICD-10-CM

## 2024-02-21 DIAGNOSIS — F3342 Major depressive disorder, recurrent, in full remission: Secondary | ICD-10-CM

## 2024-02-21 MED ORDER — CITALOPRAM HYDROBROMIDE 20 MG PO TABS: 20.0000 mg | ORAL_TABLET | Freq: Every day | ORAL | 0 refills | Status: DC

## 2024-02-21 MED ORDER — FLUTICASONE PROPIONATE 50 MCG/ACT NA SUSP: NASAL | 0 refills | Status: DC

## 2024-02-21 NOTE — Telephone Encounter (Signed)
 Requested Prescriptions  Pending Prescriptions Disp Refills   omeprazole  (PRILOSEC) 20 MG capsule [Pharmacy Med Name: Omeprazole  20 MG Oral Capsule Delayed Release] 100 capsule 1    Sig: TAKE 1 CAPSULE BY MOUTH DAILY     Gastroenterology: Proton Pump Inhibitors Passed - 02/21/2024 10:21 AM      Passed - Valid encounter within last 12 months    Recent Outpatient Visits           2 months ago Hypertension, unspecified type   [facility] Rockney Cid, DO       Future Appointments             In 3 months Rockney Cid, DO [facility]

## 2024-03-01 ENCOUNTER — Ambulatory Visit: Payer: 59

## 2024-03-01 VITALS — BP 128/72 | Ht 64.0 in | Wt 154.3 lb

## 2024-03-01 DIAGNOSIS — Z0000 Encounter for general adult medical examination without abnormal findings: Secondary | ICD-10-CM | POA: Diagnosis not present

## 2024-03-01 DIAGNOSIS — Z1231 Encounter for screening mammogram for malignant neoplasm of breast: Secondary | ICD-10-CM

## 2024-03-01 NOTE — Patient Instructions (Signed)
 Lisa Caldwell , Thank you for taking time out of your busy schedule to complete your Annual Wellness Visit with me. I enjoyed our conversation and look forward to speaking with you again next year. I, as well as your care team,  appreciate your ongoing commitment to your health goals. Please review the following plan we discussed and let me know if I can assist you in the future.  You have an order for:  []   2D Mammogram  [x]   3D Mammogram  []   Bone Density     Please call for appointment:  [facility]  [address] [place] [address] [phone] [facility] [address] [place], [place] [address] [phone] [facility] [address] Tracey Friday [place], [place] [address] [phone]   Make sure to wear two-piece clothing.  No lotions, powders, or deodorants the day of the appointment. Make sure to bring picture ID and insurance card.  Bring list of medications you are currently taking including any supplements.   Schedule your  screening mammogram through MyChart!   Log into your MyChart account.  Go to 'Visit' (or 'Appointments' if on mobile App) --> Schedule an Appointment  Under 'Select a Reason for Visit' choose the Mammogram Screening option.  Complete the pre-visit questions and select the time and place that best fits your schedule.   Follow up Visits: Next Medicare AWV with our clinical staff: 03/21/25 @ 9:30 AM   Have you seen your provider in the last 6 months (3 months if uncontrolled diabetes)? Yes Next Office Visit with your provider: 8/11  Clinician Recommendations:  Aim for 30 minutes of exercise or brisk walking, 6-8 glasses of water, and 5 servings of fruits and vegetables each day. TAKE CARE!      This is a list of the screening recommended for you and due dates:  Health Maintenance  Topic Date Due   COVID-19 Vaccine (6 -  2024-25 season) 06/19/2023   Mammogram  04/25/2024   Flu Shot  05/18/2024   Medicare Annual Wellness Visit  03/01/2025   Colon Cancer Screening  03/09/2026   DEXA scan (bone density measurement)  04/25/2028   Pneumonia Vaccine  Completed   Hepatitis C Screening  Completed   Zoster (Shingles) Vaccine  Completed   HPV Vaccine  Aged Out   Meningitis B Vaccine  Aged Out   DTaP/Tdap/Td vaccine  Discontinued    Advanced directives: (ACP Link)Information on Advanced Care Planning can be found at [place]  Secretary of [facility] Advance Health Care Directives Advance Health Care Directives. [email or web address]  Advance Care Planning is important because it:  [x]  Makes sure you receive the medical care that is consistent with your values, goals, and preferences  [x]  It provides guidance to your family and loved ones and reduces their decisional burden about whether or not they are making the right decisions based on your wishes.  Follow the link provided in your after visit summary or read over the paperwork we have mailed to you to help you started getting your Advance Directives in place. If you need assistance in completing these, please reach out to us  so that we can help you!

## 2024-03-01 NOTE — Progress Notes (Signed)
 Subjective:   ANAISABEL CALLIES is a 78 y.o. who presents for a Medicare Wellness preventive visit.  As a reminder, Annual Wellness Visits don't include a physical exam, and some assessments may be limited, especially if this visit is performed virtually. We may recommend an in-person visit if needed.  Visit Complete: In person  Persons Participating in Visit: Patient.  AWV Questionnaire: No: Patient Medicare AWV questionnaire was not completed prior to this visit.  Cardiac Risk Factors include: advanced age (>36men, >38 women);hypertension;dyslipidemia     Objective:     Today's Vitals   03/01/24 1433  BP: 128/72  Weight: 154 lb 4.8 oz (70 kg)  Height: 5\' 4"  (1.626 m)   Body mass index is 26.49 kg/m.     03/01/2024    2:46 PM 07/08/2023    2:38 PM 02/24/2023    2:16 PM 12/16/2022    1:45 PM 10/02/2022   10:52 PM 10/23/2021   11:09 AM 08/07/2021    4:03 PM  Advanced Directives  Does Patient Have a Medical Advance Directive? No No Yes Yes No No Yes  Type of Surveyor, minerals;Living will    Healthcare Power of [place];Living will  Copy of Healthcare Power of Attorney in Chart?   No - copy requested      Would patient like information on creating a medical advance directive? No - Patient declined     No - Patient declined     Current Medications (verified) Outpatient Encounter Medications as of 03/01/2024  Medication Sig   Ascorbic Acid (VITAMIN C) 1000 MG tablet Take 1,000 mg by mouth daily.   Biotin 5000 MCG TABS Take 1 tablet by mouth daily.   Calcium Carb-Cholecalciferol (CALCIUM PLUS VITAMIN D3) 600-20 MG-MCG TABS Take 1 tablet by mouth daily.   citalopram  (CELEXA ) 20 MG tablet Take 1 tablet (20 mg total) by mouth daily.   diphenhydrAMINE (BENADRYL) 25 MG tablet Take 25 mg by mouth daily.   ELIQUIS  5 MG TABS tablet TAKE 1 TABLET BY MOUTH TWICE  DAILY   fluticasone  (FLONASE ) 50 MCG/ACT nasal spray USE 2 SPRAYS IN BOTH  NOSTRILS DAILY    metoprolol  succinate (TOPROL -XL) 25 MG 24 hr tablet TAKE 2 TABLETS BY MOUTH DAILY   Misc Natural Products (OSTEO BI-FLEX TRIPLE STRENGTH PO) Take 1 tablet by mouth daily.   Multiple Vitamin (MULTIVITAMIN) tablet Take 1 tablet by mouth daily.   omeprazole  (PRILOSEC) 20 MG capsule TAKE 1 CAPSULE BY MOUTH DAILY   Saccharomyces boulardii (PROBIOTIC) 250 MG CAPS Take by mouth.   simvastatin  (ZOCOR ) 10 MG tablet TAKE 1 TABLET BY MOUTH AT  BEDTIME   vitamin B-12 (CYANOCOBALAMIN) 1000 MCG tablet Take 1 tablet (1,000 mcg total) by mouth daily. (Patient taking differently: Take 1,000 mcg by mouth 3 (three) times a week.)   Vitamin D -Vitamin K (K2-D3 5000) 5000-90 UNIT-MCG CAPS Take 1 capsule by mouth daily.   cetirizine  (ZYRTEC ) 5 MG tablet Take 1 tablet (5 mg total) by mouth at bedtime for 14 days.   No facility-administered encounter medications on file as of 03/01/2024.    Allergies (verified) Codeine, Peanut-containing drug products, and Phenergan [promethazine hcl]   History: Past Medical History:  Diagnosis Date   Allergy    Anxiety    Arthritis    Cancer (HCC)    colon cancer   Depression    Dysrhythmia    GERD (gastroesophageal reflux disease)    Headache(784.0)    Hyperlipidemia  Hypertension    Inflammatory polyps of colon with rectal bleeding (HCC) 2008    rt colectomy for a polyp with carcinoma in situ    Personal history of malignant neoplasm of large intestine    PONV (postoperative nausea and vomiting)    Past Surgical History:  Procedure Laterality Date   ABDOMINAL HYSTERECTOMY  10/18/1994   APPENDECTOMY     COLECTOMY Right 10/18/2006   COLONOSCOPY  10/18/2010   Dr. Lorel Roes   COLONOSCOPY WITH PROPOFOL  N/A 03/10/2016   Procedure: COLONOSCOPY WITH PROPOFOL ;  Surgeon: Jerlean Mood, MD;  Location: [facility] ENDOSCOPY;  Service: Endoscopy;  Laterality: N/A;   COLONOSCOPY WITH PROPOFOL  N/A 03/09/2021   Procedure: COLONOSCOPY WITH PROPOFOL ;  Surgeon: Luke Salaam, MD;   Location: [facility] ENDOSCOPY;  Service: Gastroenterology;  Laterality: N/A;   dislocated shoulder  Right    placed back into the socket   FOOT SURGERY Left 10/18/2010   FOOT SURGERY Left 10/19/2011   THYROGLOSSAL DUCT CYST N/A 09/04/2015   Procedure: THYROGLOSSAL DUCT CYST;  Surgeon: Rogers Clayman, MD;  Location: [facility] ORS;  Service: ENT;  Laterality: N/A;   Family History  Problem Relation Age of Onset   Heart disease Mother    Heart disease Father    Drug abuse Brother    Breast cancer Neg Hx    Social History   Socioeconomic History   Marital status: Married    Spouse name: Tom   Number of children: 2   Years of education: Not on file   Highest education level: GED or equivalent  Occupational History   Occupation: Retired  Tobacco Use   Smoking status: Never   Smokeless tobacco: Never  Vaping Use   Vaping status: Never Used  Substance and Sexual Activity   Alcohol use: Yes    Comment: 2 a day   Drug use: No   Sexual activity: Not Currently    Birth control/protection: Other-see comments    Comment: hysterectomy  Other Topics Concern   Not on file  Social History Narrative   Not on file   Social Drivers of Health   Financial Resource Strain: Low Risk  (03/01/2024)   Overall Financial Resource Strain (CARDIA)    Difficulty of Paying Living Expenses: Not hard at all  Food Insecurity: No Food Insecurity (03/01/2024)   Hunger Vital Sign    Worried About Running Out of Food in the Last Year: Never true    Ran Out of Food in the Last Year: Never true  Transportation Needs: No Transportation Needs (03/01/2024)   PRAPARE - Administrator, Civil Service (Medical): No    Lack of Transportation (Non-Medical): No  Physical Activity: Sufficiently Active (03/01/2024)   Exercise Vital Sign    Days of Exercise per Week: 5 days    Minutes of Exercise per Session: 30 min  Stress: No Stress Concern Present (03/01/2024)   [employer] of Occupational Health -  Occupational Stress Questionnaire    Feeling of Stress : Not at all  Social Connections: Moderately Isolated (03/01/2024)   Social Connection and Isolation Panel [NHANES]    Frequency of Communication with Friends and Family: More than three times a week    Frequency of Social Gatherings with Friends and Family: Three times a week    Attends Religious Services: Never    Active Member of Clubs or Organizations: No    Attends Banker Meetings: Never    Marital Status: Married    Tobacco Counseling Counseling given:  Not Answered    Clinical Intake:  Pre-visit preparation completed: Yes  Pain : No/denies pain     BMI - recorded: 26.49 Nutritional Status: BMI 25 -29 Overweight Nutritional Risks: None Diabetes: No  Lab Results  Component Value Date   HGBA1C 5.4 06/12/2021   HGBA1C 5.0 05/22/2019   HGBA1C 5.2 05/17/2018     How often do you need to have someone help you when you read instructions, pamphlets, or other written materials from your doctor or pharmacy?: 1 - Never  Interpreter Needed?: No  Information entered by :: Dellie Fergusson, LPN   Activities of Daily Living     03/01/2024    2:49 PM 02/26/2024   11:18 AM  In your present state of health, do you have any difficulty performing the following activities:  Hearing? 0 0  Vision? 0 0  Difficulty concentrating or making decisions? 0 0  Walking or climbing stairs? 0 0  Dressing or bathing? 0 0  Doing errands, shopping? 0 0  Preparing Food and eating ? N N  Using the Toilet? N N  In the past six months, have you accidently leaked urine? N N  Do you have problems with loss of bowel control? N N  Managing your Medications? N N  Managing your Finances? N N  Housekeeping or managing your Housekeeping? N N    Patient Care Team: Rockney Cid, DO as PCP - General (Internal Medicine) Wenona Hamilton, MD as PCP - Cardiology (Cardiology) [facility]  Indicate  any recent Medical Services you may have received from other than [facility] providers in the past year (date may be approximate).     Assessment:    This is a routine wellness examination for [place].  Hearing/Vision screen Hearing Screening - Comments:: NO AIDS, GOING TO GET EARS CHECKED Vision Screening - Comments:: WEARS GLASSES ALL DAY- [facility]   Goals Addressed             This Visit's Progress    DIET - EAT MORE FRUITS AND VEGETABLES         Depression Screen     03/01/2024    2:43 PM 11/28/2023    8:33 AM 05/27/2023   12:56 PM 03/28/2023    1:08 PM 02/24/2023    2:13 PM 11/25/2022   10:52 AM 07/20/2022    2:56 PM  PHQ 2/9 Scores  PHQ - 2 Score 1 0 0 0 0 0 0  PHQ- 9 Score 2  0 0  0 0    Fall Risk     03/01/2024    2:48 PM 02/26/2024   11:18 AM 11/28/2023    8:32 AM 05/27/2023   12:56 PM 03/28/2023    1:08 PM  Fall Risk   Falls in the past year? 0 1 0 1 1  Comment LAST FALL IN 2023      Number falls in past yr: 0 0 0 0 0  Injury with Fall? 0 1 0 1 1  Risk for fall due to : History of fall(s)  No Fall Risks  No Fall Risks  Follow up Falls prevention discussed;Falls evaluation completed  Falls evaluation completed  Falls prevention discussed    MEDICARE RISK AT HOME:  Medicare Risk at Home Any stairs in or around the home?: Yes If so, are there any without handrails?: No Home free of loose throw rugs in walkways, pet beds, electrical cords, etc?: Yes Adequate lighting in your home  to reduce risk of falls?: Yes Life alert?: No Use of a cane, walker or w/c?: No Grab bars in the bathroom?: Yes Shower chair or bench in shower?: No Elevated toilet seat or a handicapped toilet?: Yes  TIMED UP AND GO:  Was the test performed?  Yes  Length of time to ambulate 10 feet: 4 sec Gait steady and fast without use of assistive device  Cognitive Function: 6CIT completed    10/23/2021   11:12 AM  MMSE - Mini Mental State Exam  Not completed: Unable to complete         03/01/2024    2:50 PM 02/24/2023    2:24 PM 10/23/2021   11:12 AM 05/26/2020    9:10 AM 05/12/2017    1:44 PM  6CIT Screen  What Year? 0 points 0 points 0 points 0 points 0 points  What month? 0 points 0 points 0 points 0 points 0 points  What time? 0 points 0 points 0 points 0 points 0 points  Count back from 20 0 points 0 points 0 points 0 points 0 points  Months in reverse 0 points 0 points 0 points 0 points 0 points  Repeat phrase 0 points 0 points 0 points 0 points 0 points  Total Score 0 points 0 points 0 points 0 points 0 points    Immunizations Immunization History  Administered Date(s) Administered   Fluad Quad(high Dose 65+) 07/20/2022   H1N1 09/25/2008   Influenza Split 07/16/2010, 07/28/2011, 08/08/2012   Influenza, High Dose Seasonal PF 07/16/2014, 07/19/2015, 07/09/2016, 07/21/2017, 07/19/2018, 07/03/2020   Influenza,inj,Quad PF,6+ Mos 07/12/2013   Influenza-Unspecified 07/13/2021, 06/07/2023   PFIZER Comirnaty(Gray Top)Covid-19 Tri-Sucrose Vaccine 01/30/2021   PFIZER(Purple Top)SARS-COV-2 Vaccination 12/08/2019, 01/01/2020, 07/17/2020, 07/13/2021   PNEUMOCOCCAL CONJUGATE-20 11/25/2022   Pneumococcal Conjugate-13 05/05/2015   Pneumococcal Polysaccharide-23 08/27/2011   RSV,unspecified 06/07/2023   Respiratory Syncytial Virus Vaccine,Recomb Aduvanted(Arexvy) 06/07/2023   Tdap 01/11/2006   Zoster Recombinant(Shingrix ) 05/16/2017, 07/21/2017   Zoster, Live 08/17/2011    Screening Tests Health Maintenance  Topic Date Due   COVID-19 Vaccine (6 - 2024-25 season) 06/19/2023   MAMMOGRAM  04/25/2024   INFLUENZA VACCINE  05/18/2024   Medicare Annual Wellness (AWV)  03/01/2025   Colonoscopy  03/09/2026   DEXA SCAN  04/25/2028   Pneumonia Vaccine 30+ Years old  Completed   Hepatitis C Screening  Completed   Zoster Vaccines- Shingrix   Completed   HPV VACCINES  Aged Out   Meningococcal B Vaccine  Aged Out   DTaP/Tdap/Td  Discontinued    Health Maintenance  Health  Maintenance Due  Topic Date Due   COVID-19 Vaccine (6 - 2024-25 season) 06/19/2023   Health Maintenance Items Addressed: UP TO DATE ON SHOTS; AGED OUT FOR COLONOSCOPY; ORDER SENT FOR MAMMOGRAM; UP TO DATE ON BDS  Additional Screening:  Vision Screening: Recommended annual ophthalmology exams for early detection of glaucoma and other disorders of the eye.  Dental Screening: Recommended annual dental exams for proper oral hygiene  Community Resource Referral / Chronic Care Management: CRR required this visit?  No   CCM required this visit?  No   Plan:    I have personally reviewed and noted the following in the patient's chart:   Medical and social history Use of alcohol, tobacco or illicit drugs  Current medications and supplements including opioid prescriptions. Patient is not currently taking opioid prescriptions. Functional ability and status Nutritional status Physical activity Advanced directives List of other physicians Hospitalizations, surgeries, and ER visits in  previous 12 months Vitals Screenings to include cognitive, depression, and falls Referrals and appointments  In addition, I have reviewed and discussed with patient certain preventive protocols, quality metrics, and best practice recommendations. A written personalized care plan for preventive services as well as general preventive health recommendations were provided to patient.   Pinky Bright, LPN   1/61/0960   After Visit Summary: (In Person-Declined) Patient declined AVS at this time.  Notes: MAMMOGRAM REFERRAL SENT

## 2024-03-21 ENCOUNTER — Encounter: Payer: Self-pay | Admitting: Student

## 2024-03-21 ENCOUNTER — Ambulatory Visit: Attending: Student | Admitting: Student

## 2024-03-21 VITALS — BP 117/74 | HR 66 | Ht 64.5 in | Wt 154.0 lb

## 2024-03-21 DIAGNOSIS — E782 Mixed hyperlipidemia: Secondary | ICD-10-CM

## 2024-03-21 DIAGNOSIS — I480 Paroxysmal atrial fibrillation: Secondary | ICD-10-CM

## 2024-03-21 DIAGNOSIS — I10 Essential (primary) hypertension: Secondary | ICD-10-CM

## 2024-03-21 NOTE — Progress Notes (Signed)
 Cardiology Clinic Note   Date: 03/21/2024 ID: Lisa Caldwell, DOB December 05, 1945, MRN [id number]  Primary Cardiologist:  Antionette Kirks, MD  Chief Complaint   Lisa Caldwell is a 78 y.o. female who presents to the clinic today for routine follow up.   Patient Profile   Lisa Caldwell is followed by Dr. Alvenia Aus for the history outlined below.      Past medical history significant for: PAF. Onset 2022. Echo 07/13/2023: EF 60 to 65%.  No RWMA.  Normal diastolic parameters.  Normal RV size/function.  Mild MR. Hypertension. Hyperlipidemia. Lipid panel 11/28/2023: LDL 104, HDL 68, TG 106, total 192. GERD.  In summary, patient was first evaluated by Dr. Alvenia Aus on 06/14/2023 to establish care.  She had previously been followed by Dr. Loetta Ringer for A-fib.  Patient presented to [facility] ED in 2022 with dizziness and was found to be in A-fib with RVR.  She converted to sinus rhythm with diltiazem  and was placed on Eliquis  and Toprol .  She has not had recurrent A-fib since then.  She reported occasional DOE.  She underwent echo which demonstrated normal LV/RV function.     History of Present Illness    Today, patient reports she is doing well. Patient denies shortness of breath, lower extremity edema, orthopnea or PND. She has occasional dyspnea with exertion that resolves with rest. There is no pattern as to when this occurs and she is unable to specify a particular activity that brings this on. She is very active doing aerobics and weight lifting 5 days a week as well as light to moderate yard work with episodes of dyspnea.  No chest pain, pressure, or tightness. No palpitations. She has issues with occasional hemorrhoids but has not noticed blood in her stool or urine.       ROS: All other systems reviewed and are otherwise negative except as noted in History of Present Illness.  EKGs/Labs Reviewed    EKG Interpretation Date/Time:  Wednesday March 21 2024 14:26:22 EDT Ventricular Rate:  66 PR  Interval:  138 QRS Duration:  80 QT Interval:  414 QTC Calculation: 434 R Axis:   65  Text Interpretation: Normal sinus rhythm Normal ECG When compared with ECG of 14-Jun-2023 15:05, No significant change was found Confirmed by Morey Ar [phone]) on 03/21/2024 2:37:54 PM   11/28/2023: ALT 14; AST 19; BUN 13; Creat 0.72; Potassium 4.2; Sodium 140   11/28/2023: Hemoglobin 12.8; WBC 6.0    Risk Assessment/Calculations     CHA2DS2-VASc Score = 4   This indicates a 4.8% annual risk of stroke. The patient's score is based upon: CHF History: 0 HTN History: 1 Diabetes History: 0 Stroke History: 0 Vascular Disease History: 0 Age Score: 2 Gender Score: 1             Physical Exam    VS:  BP 117/74   Pulse 66   Ht 5' 4.5" (1.638 m)   Wt 154 lb (69.9 kg)   SpO2 96%   BMI 26.03 kg/m  , BMI Body mass index is 26.03 kg/m.  GEN: Well nourished, well developed, in no acute distress. Neck: No JVD or carotid bruits. Cardiac:  RRR.  No murmurs. No rubs or gallops.   Respiratory:  Respirations regular and unlabored. Clear to auscultation without rales, wheezing or rhonchi. GI: Soft, nontender, nondistended. Extremities: Radials/DP/PT 2+ and equal bilaterally. No clubbing or cyanosis. No edema.   Skin: Warm and dry, no rash. Neuro: Strength intact.  Assessment &  Plan   PAF Onset 2022 with no recurrence.  Denies spontaneous bleeding concerns.  Patient denies palpitations. EKG today shows NSR 66 bpm.  - Continue Toprol , Eliquis .  Hypertension BP today 117/74. No report of headaches or dizziness.  - Continue Toprol .  Hyperlipidemia LDL 104 February 2025. - Continue simvastatin . - Continue to follow with PCP.  Disposition: Return in 6 months or sooner as needed.          Signed, Lonell Rives. Emmaclaire Switala, DNP, NP-C

## 2024-03-21 NOTE — Patient Instructions (Signed)
 Medication Instructions:  Your physician recommends that you continue on your current medications as directed. Please refer to the Current Medication list given to you today.   *If you need a refill on your cardiac medications before your next appointment, please call your pharmacy*  Lab Work: None ordered at this time  If you have labs (blood work) drawn today and your tests are completely normal, you will receive your results only by: MyChart Message (if you have MyChart) OR A paper copy in the mail If you have any lab test that is abnormal or we need to change your treatment, we will call you to review the results.  Testing/Procedures: None ordered at this time   Follow-Up: At [facility], you and your health needs are our priority.  As part of our continuing mission to provide you with exceptional heart care, our providers are all part of one team.  This team includes your primary Cardiologist (physician) and Advanced Practice Providers or APPs (Physician Assistants and Nurse Practitioners) who all work together to provide you with the care you need, when you need it.  Your next appointment:   6 month(s)  Provider:   You may see Antionette Kirks, MD or one of the following Advanced Practice Providers on your designated Care Team:   Laneta Pintos, NP Gildardo Labrador, PA-C Varney Gentleman, PA-C Cadence Avalon, PA-C Ronald Cockayne, NP Morey Ar, NP    We recommend signing up for the patient portal called "MyChart".  Sign up information is provided on this After Visit Summary.  MyChart is used to connect with patients for Virtual Visits (Telemedicine).  Patients are able to view lab/test results, encounter notes, upcoming appointments, etc.  Non-urgent messages can be sent to your provider as well.   To learn more about what you can do with MyChart, go to [email or web address].

## 2024-04-15 ENCOUNTER — Other Ambulatory Visit: Payer: Self-pay | Admitting: Internal Medicine

## 2024-04-15 DIAGNOSIS — E7800 Pure hypercholesterolemia, unspecified: Secondary | ICD-10-CM

## 2024-04-17 NOTE — Telephone Encounter (Signed)
 Requested Prescriptions  Pending Prescriptions Disp Refills   simvastatin  (ZOCOR ) 10 MG tablet [Pharmacy Med Name: Simvastatin  10 MG Oral Tablet] 100 tablet 1    Sig: TAKE 1 TABLET BY MOUTH AT  BEDTIME     Cardiovascular:  Antilipid - Statins Failed - 04/17/2024  2:24 PM      Failed - Lipid Panel in normal range within the last 12 months    Cholesterol, Total  Date Value Ref Range Status  06/12/2021 201 (H) 100 - 199 mg/dL Final   Cholesterol  Date Value Ref Range Status  11/28/2023 192 <200 mg/dL Final   LDL Cholesterol (Calc)  Date Value Ref Range Status  11/28/2023 104 (H) mg/dL (calc) Final    Comment:    Reference range: <100 . Desirable range <100 mg/dL for primary prevention;   <70 mg/dL for patients with CHD or diabetic patients  with > or = 2 CHD risk factors. SABRA LDL-C is now calculated using the Martin-Hopkins  calculation, which is a validated novel method providing  better accuracy than the Friedewald equation in the  estimation of LDL-C.  Gladis APPLETHWAITE et al. SANDREA. 7986;689(80): 2061-2068  (http://education.[employer].com/faq/FAQ164)    HDL  Date Value Ref Range Status  11/28/2023 68 > OR = 50 mg/dL Final  91/73/7977 77 >60 mg/dL Final   Triglycerides  Date Value Ref Range Status  11/28/2023 106 <150 mg/dL Final         Passed - Patient is not pregnant      Passed - Valid encounter within last 12 months    Recent Outpatient Visits           4 months ago Hypertension, unspecified type   [facility] Bernardo Fend, DO       Future Appointments             In 1 month Bernardo Fend, DO [facility], [facility]

## 2024-04-27 ENCOUNTER — Ambulatory Visit
Admission: RE | Admit: 2024-04-27 | Discharge: 2024-04-27 | Disposition: A | Discharge status: Home / Self Care | Source: Ambulatory Visit | Attending: Internal Medicine | Admitting: Internal Medicine

## 2024-04-27 DIAGNOSIS — Z1231 Encounter for screening mammogram for malignant neoplasm of breast: Secondary | ICD-10-CM | POA: Diagnosis not present

## 2024-05-02 ENCOUNTER — Ambulatory Visit: Payer: Self-pay | Admitting: Internal Medicine

## 2024-05-17 ENCOUNTER — Other Ambulatory Visit: Payer: Self-pay | Admitting: Internal Medicine

## 2024-05-17 DIAGNOSIS — I480 Paroxysmal atrial fibrillation: Secondary | ICD-10-CM

## 2024-05-17 DIAGNOSIS — F3342 Major depressive disorder, recurrent, in full remission: Secondary | ICD-10-CM

## 2024-05-17 DIAGNOSIS — J309 Allergic rhinitis, unspecified: Secondary | ICD-10-CM

## 2024-05-18 NOTE — Telephone Encounter (Signed)
 Requested Prescriptions  Pending Prescriptions Disp Refills   apixaban  (ELIQUIS ) 5 MG TABS tablet [Pharmacy Med Name: Eliquis  5 MG Oral Tablet] 200 tablet 0    Sig: TAKE 1 TABLET BY MOUTH TWICE  DAILY     Hematology:  Anticoagulants - apixaban  Passed - 05/18/2024 11:14 AM      Passed - PLT in normal range and within 360 days    Platelets  Date Value Ref Range Status  11/28/2023 242 140 - 400 Thousand/uL Final  06/12/2021 248 150 - 450 x10E3/uL Final         Passed - HGB in normal range and within 360 days    Hemoglobin  Date Value Ref Range Status  11/28/2023 12.8 11.7 - 15.5 g/dL Final  91/73/7977 86.7 11.1 - 15.9 g/dL Final         Passed - HCT in normal range and within 360 days    HCT  Date Value Ref Range Status  11/28/2023 39.7 35.0 - 45.0 % Final   Hematocrit  Date Value Ref Range Status  06/12/2021 39.4 34.0 - 46.6 % Final         Passed - Cr in normal range and within 360 days    Creat  Date Value Ref Range Status  11/28/2023 0.72 0.60 - 1.00 mg/dL Final         Passed - AST in normal range and within 360 days    AST  Date Value Ref Range Status  11/28/2023 19 10 - 35 U/L Final   SGOT(AST)  Date Value Ref Range Status  11/29/2011 36 15 - 37 Unit/L Final         Passed - ALT in normal range and within 360 days    ALT  Date Value Ref Range Status  11/28/2023 14 6 - 29 U/L Final   SGPT (ALT)  Date Value Ref Range Status  11/29/2011 44 U/L Final    Comment:    12-78 NOTE: NEW REFERENCE RANGE 09/10/2011          Passed - Valid encounter within last 12 months    Recent Outpatient Visits           5 months ago Hypertension, unspecified type   [facility] Bernardo Fend, DO       Future Appointments             In 1 week Bernardo Fend, DO [facility]             citalopram  (CELEXA ) 20 MG tablet [Pharmacy Med Name: Citalopram  Hydrobromide 20 MG Oral Tablet] 90 tablet 0     Sig: TAKE 1 TABLET BY MOUTH DAILY     Psychiatry:  Antidepressants - SSRI Passed - 05/18/2024 11:14 AM      Passed - Completed PHQ-2 or PHQ-9 in the last 360 days      Passed - Valid encounter within last 6 months    Recent Outpatient Visits           5 months ago Hypertension, unspecified type   [facility] Bernardo Fend, DO       Future Appointments             In 1 week Bernardo Fend, DO  [facility]             fluticasone  (FLONASE ) 50 MCG/ACT nasal spray [Pharmacy Med Name: Fluticasone  Propionate 50 MCG/ACT Nasal Suspension] 64 g  0    Sig: USE 2 SPRAYS IN BOTH NOSTRILS  DAILY     Ear, Nose, and Throat: Nasal Preparations - Corticosteroids Passed - 05/18/2024 11:14 AM      Passed - Valid encounter within last 12 months    Recent Outpatient Visits           5 months ago Hypertension, unspecified type   [facility] Bernardo Fend, DO       Future Appointments             In 1 week Bernardo Fend, DO [facility], [facility]

## 2024-05-28 ENCOUNTER — Ambulatory Visit: Payer: 59 | Admitting: Internal Medicine

## 2024-06-12 ENCOUNTER — Ambulatory Visit (INDEPENDENT_AMBULATORY_CARE_PROVIDER_SITE_OTHER): Admitting: Internal Medicine

## 2024-06-12 ENCOUNTER — Other Ambulatory Visit: Payer: Self-pay

## 2024-06-12 ENCOUNTER — Encounter: Payer: Self-pay | Admitting: Internal Medicine

## 2024-06-12 VITALS — BP 120/74 | HR 81 | Temp 98.0°F | Resp 16 | Ht 64.0 in | Wt 152.7 lb

## 2024-06-12 DIAGNOSIS — F3342 Major depressive disorder, recurrent, in full remission: Secondary | ICD-10-CM | POA: Diagnosis not present

## 2024-06-12 DIAGNOSIS — I480 Paroxysmal atrial fibrillation: Secondary | ICD-10-CM | POA: Diagnosis not present

## 2024-06-12 DIAGNOSIS — I10 Essential (primary) hypertension: Secondary | ICD-10-CM

## 2024-06-12 DIAGNOSIS — J3489 Other specified disorders of nose and nasal sinuses: Secondary | ICD-10-CM | POA: Diagnosis not present

## 2024-06-12 DIAGNOSIS — R7989 Other specified abnormal findings of blood chemistry: Secondary | ICD-10-CM | POA: Diagnosis not present

## 2024-06-12 DIAGNOSIS — E7800 Pure hypercholesterolemia, unspecified: Secondary | ICD-10-CM | POA: Diagnosis not present

## 2024-06-12 MED ORDER — SIMVASTATIN 20 MG PO TABS: 20.0000 mg | ORAL_TABLET | Freq: Every day | ORAL | 3 refills | Status: AC

## 2024-06-12 NOTE — Progress Notes (Signed)
 Established Patient Office Visit  Subjective   Patient ID: Lisa Caldwell, female    DOB: 1946/08/17  Age: 78 y.o. MRN: [id number]  Chief Complaint  Patient presents with   Medical Management of Chronic Issues    HPI  Lisa Caldwell presents to follow up on chronic medical conditions.   Discussed the use of AI scribe software for clinical note transcription with the patient, who gave verbal consent to proceed.  History of Present Illness Lisa Caldwell is a 78 year old female who presents for a routine follow-up visit.  She takes metoprolol  and Eliquis  with good tolerance, maintaining well-controlled blood pressure despite recent stress. Her cholesterol is managed with Zocor  10 mg daily, with an LDL of 104 mg/dL, improved from 886 mg/dL a year ago. She exercises regularly, five days a week. Her vitamin D  intake is reduced to three times a week due to elevated levels at 107 ng/mL. She experiences sinus drainage, managed with nasal spray, which she did not use today, possibly contributing to her symptoms. No other significant symptoms are present.   Hypertension/A.Fib: -Medications: Metoprolol  XL 25 mg BID, Eliquis  5 mg BID She denies recent palpitation, chest pain, tachycardia or concerns with A.fib  -Patient is compliant with above medications and reports no side effects. -Denies any SOB, CP, vision changes, LE edema or symptoms of hypotension  HLD: -Medications: Zocor  10 mg -Patient is compliant with above medications and reports no side effects.  -Last lipid panel:Lipid Panel     Component Value Date/Time   CHOL 192 11/28/2023 0859   CHOL 201 (H) 06/12/2021 1052   TRIG 106 11/28/2023 0859   HDL 68 11/28/2023 0859   HDL 77 06/12/2021 1052   CHOLHDL 2.8 11/28/2023 0859   LDLCALC 104 (H) 11/28/2023 0859   LABVLDL 14 06/12/2021 1052    MDD: -Mood status: stable -Current treatment: Celexa  20 mg -Satisfied with current treatment?: yes -Duration of current treatment :  years -Side effects: no -Medication compliance: excellent compliance     03/01/2024    2:43 PM 11/28/2023    8:33 AM 05/27/2023   12:56 PM 03/28/2023    1:08 PM 02/24/2023    2:13 PM  Depression screen PHQ 2/9  Decreased Interest 0 0 0 0 0  Down, Depressed, Hopeless 1 0 0 0 0  PHQ - 2 Score 1 0 0 0 0  Altered sleeping 0  0 0   Tired, decreased energy 1  0 0   Change in appetite 0  0 0   Feeling bad or failure about yourself  0  0 0   Trouble concentrating 0  0 0   Moving slowly or fidgety/restless 0  0 0   Suicidal thoughts 0  0 0   PHQ-9 Score 2  0 0   Difficult doing work/chores Not difficult at all  Not difficult at all     GERD: -Currently on Prilosec 20 mg, taking daily, denies symptoms    Vitamin D /Vitamin B12 Deficiency: -Currently on Vitamin B12 1000 mcg 3 times a week, Vitamin D  and K2 decreased since last labs.   History of Colon Cancer:  -Diagnosed with cancerous colon polyp in 2008, underwent partial colectomy at the time. Did not need radiation/chemotherapy.  -Colonoscopy in 2022 negative, was told she did not need further screening   Health Maintenance: -Blood work UTD -Mammogram 7/25 Birads-1 -DEXA 7/24 stable, osteopenia in the right hip and left forearm - taking vitamin d  and calcium, also  doing aerobics and lift weights, [place] as regular exercise   Patient Active Problem List   Diagnosis Date Noted   Hypertension 11/28/2023   Vitamin D  deficiency 11/28/2023   Paroxysmal atrial fibrillation (HCC) 08/17/2021   Encounter for Medicare annual wellness exam 06/01/2021   Annual physical exam 06/01/2021   Encounter for vitamin deficiency screening 06/01/2021   Primary osteoarthritis of right knee 06/01/2021   Vitamin B12 deficiency 06/01/2021   Hypercholesterolemia 06/01/2021   Encounter for cardiac risk counseling 06/01/2021   Chronic allergic rhinitis 06/01/2021   Recurrent major depressive disorder, in full remission (HCC) 05/26/2020   Right facial  numbness 05/19/2018   Alcohol abuse 05/16/2016   Osteopenia 04/06/2016   History of thyroglossal duct cyst removal 09/04/2015   Persistent thyroglossal duct cyst 07/25/2015   Adaptation reaction 03/04/2015   Colon, diverticulosis 03/04/2015   Elevated blood sugar 03/04/2015   Acid reflux 03/04/2015   High potassium 03/04/2015   Hammer toe 03/04/2015   Headache, migraine 03/04/2015   Allergic rhinitis 04/03/2013   Clinical depression 04/03/2013   History of colon cancer 01/31/2013   Past Medical History:  Diagnosis Date   Allergy    Anxiety    Arthritis    Cancer (HCC)    colon cancer   Depression    Dysrhythmia    GERD (gastroesophageal reflux disease)    Headache(784.0)    Hyperlipidemia    Hypertension    Inflammatory polyps of colon with rectal bleeding (HCC) 2008    rt colectomy for a polyp with carcinoma in situ    Personal history of malignant neoplasm of large intestine    PONV (postoperative nausea and vomiting)    Past Surgical History:  Procedure Laterality Date   ABDOMINAL HYSTERECTOMY  10/18/1994   APPENDECTOMY     COLECTOMY Right 10/18/2006   COLONOSCOPY  10/18/2010   Dr. Dellie   COLONOSCOPY WITH PROPOFOL  N/A 03/10/2016   Procedure: COLONOSCOPY WITH PROPOFOL ;  Surgeon: Louanne KANDICE Dellie, MD;  Location: [facility] ENDOSCOPY;  Service: Endoscopy;  Laterality: N/A;   COLONOSCOPY WITH PROPOFOL  N/A 03/09/2021   Procedure: COLONOSCOPY WITH PROPOFOL ;  Surgeon: Therisa Bi, MD;  Location: [facility] ENDOSCOPY;  Service: Gastroenterology;  Laterality: N/A;   dislocated shoulder  Right    placed back into the socket   FOOT SURGERY Left 10/18/2010   FOOT SURGERY Left 10/19/2011   THYROGLOSSAL DUCT CYST N/A 09/04/2015   Procedure: THYROGLOSSAL DUCT CYST;  Surgeon: Carolee Hunter, MD;  Location: [facility] ORS;  Service: ENT;  Laterality: N/A;   Social History   Tobacco Use   Smoking status: Never   Smokeless tobacco: Never  Vaping Use   Vaping status: Never Used   Substance Use Topics   Alcohol use: Yes    Comment: 2 a day   Drug use: No   Social History   Socioeconomic History   Marital status: Married    Spouse name: Tom   Number of children: 2   Years of education: Not on file   Highest education level: GED or equivalent  Occupational History   Occupation: Retired  Tobacco Use   Smoking status: Never   Smokeless tobacco: Never  Vaping Use   Vaping status: Never Used  Substance and Sexual Activity   Alcohol use: Yes    Comment: 2 a day   Drug use: No   Sexual activity: Not Currently    Birth control/protection: Other-see comments    Comment: hysterectomy  Other Topics Concern   Not on  file  Social History Narrative   Not on file   Social Drivers of Health   Financial Resource Strain: Low Risk  (05/24/2024)   Overall Financial Resource Strain (CARDIA)    Difficulty of Paying Living Expenses: Not hard at all  Food Insecurity: No Food Insecurity (05/24/2024)   Hunger Vital Sign    Worried About Running Out of Food in the Last Year: Never true    Ran Out of Food in the Last Year: Never true  Transportation Needs: No Transportation Needs (05/24/2024)   PRAPARE - Administrator, Civil Service (Medical): No    Lack of Transportation (Non-Medical): No  Physical Activity: Sufficiently Active (05/24/2024)   Exercise Vital Sign    Days of Exercise per Week: 5 days    Minutes of Exercise per Session: 30 min  Stress: No Stress Concern Present (05/24/2024)   [employer] of Occupational Health - Occupational Stress Questionnaire    Feeling of Stress: Not at all  Social Connections: Moderately Isolated (05/24/2024)   Social Connection and Isolation Panel    Frequency of Communication with Friends and Family: Once a week    Frequency of Social Gatherings with Friends and Family: Three times a week    Attends Religious Services: Never    Active Member of Clubs or Organizations: No    Attends Banker Meetings: Not  on file    Marital Status: Married  Catering manager Violence: Not At Risk (03/01/2024)   Humiliation, Afraid, Rape, and Kick questionnaire    Fear of Current or Ex-Partner: No    Emotionally Abused: No    Physically Abused: No    Sexually Abused: No   Family Status  Relation Name Status   Mother Leartis Doom Deceased at age 72   Father Edsel Buckner Other   Brother Charlie Doom Deceased at age 74       heart disease   Neg Hx  (Not Specified)  No partnership data on file   Family History  Problem Relation Age of Onset   Heart disease Mother    Heart disease Father    Drug abuse Brother    Breast cancer Neg Hx    Allergies  Allergen Reactions   Codeine Nausea And Vomiting   Peanut-Containing Drug Products Diarrhea and Nausea And Vomiting    Pine nut   Phenergan [Promethazine Hcl] Other (See Comments)    tremors      Review of Systems  All other systems reviewed and are negative.     Objective:     BP 120/74 (Cuff Size: Large)   Pulse 81   Temp 98 F (36.7 C) (Oral)   Resp 16   Ht 5' 4 (1.626 m)   Wt 152 lb 11.2 oz (69.3 kg)   SpO2 95%   BMI 26.21 kg/m  BP Readings from Last 3 Encounters:  06/12/24 120/74  03/21/24 117/74  03/01/24 128/72   Wt Readings from Last 3 Encounters:  06/12/24 152 lb 11.2 oz (69.3 kg)  03/21/24 154 lb (69.9 kg)  03/01/24 154 lb 4.8 oz (70 kg)      Physical Exam Constitutional:      Appearance: Normal appearance.  HENT:     Head: Normocephalic and atraumatic.     Mouth/Throat:     Mouth: Mucous membranes are moist.     Pharynx: Oropharynx is clear.  Eyes:     Conjunctiva/sclera: Conjunctivae normal.  Cardiovascular:     Rate and Rhythm: Normal  rate and regular rhythm.  Pulmonary:     Effort: Pulmonary effort is normal.     Breath sounds: Normal breath sounds.  Musculoskeletal:     Right lower leg: No edema.     Left lower leg: No edema.  Skin:    General: Skin is warm and dry.  Neurological:     General:  No focal deficit present.     Mental Status: She is alert. Mental status is at baseline.  Psychiatric:        Mood and Affect: Mood normal.        Behavior: Behavior normal.      No results found for any visits on 06/12/24.  Last CBC Lab Results  Component Value Date   WBC 6.0 11/28/2023   HGB 12.8 11/28/2023   HCT 39.7 11/28/2023   MCV 101.5 (H) 11/28/2023   MCH 32.7 11/28/2023   RDW 11.5 11/28/2023   PLT 242 11/28/2023   Last metabolic panel Lab Results  Component Value Date   GLUCOSE 102 (H) 11/28/2023   NA 140 11/28/2023   K 4.2 11/28/2023   CL 106 11/28/2023   CO2 28 11/28/2023   BUN 13 11/28/2023   CREATININE 0.72 11/28/2023   GFRNONAA >60 12/16/2022   CALCIUM 9.5 11/28/2023   PROT 7.0 11/28/2023   ALBUMIN 3.7 12/16/2022   LABGLOB 2.6 06/12/2021   AGRATIO 1.7 06/12/2021   BILITOT 0.5 11/28/2023   ALKPHOS 69 12/16/2022   AST 19 11/28/2023   ALT 14 11/28/2023   ANIONGAP 8 12/16/2022   Last lipids Lab Results  Component Value Date   CHOL 192 11/28/2023   HDL 68 11/28/2023   LDLCALC 104 (H) 11/28/2023   TRIG 106 11/28/2023   CHOLHDL 2.8 11/28/2023   Last hemoglobin A1c Lab Results  Component Value Date   HGBA1C 5.4 06/12/2021   Last thyroid  functions Lab Results  Component Value Date   TSH 3.04 07/23/2022   T3TOTAL 95 07/23/2022   T4TOTAL 5.4 07/23/2022   Last vitamin D  Lab Results  Component Value Date   VD25OH 107 (H) 11/28/2023   Last vitamin B12 and Folate Lab Results  Component Value Date   VITAMINB12 727 11/28/2023      The 10-year ASCVD risk score (Arnett DK, et al., 2019) is: 26%    Assessment & Plan:   Assessment & Plan Hyperlipidemia LDL cholesterol was 104, above target. Current Zocor  dose is low. - Increase Zocor  to 20 mg daily. - If 10 mg tablets remain, take two daily until new prescription is filled.  Hypertension Blood pressure controlled at 120/74 with metoprolol .  Atrial fibrillation Managed with Eliquis ,  no issues reported.  Sinus allergies Sinus drainage likely due to ragweed. Nasal spray effective. - Continue nasal spray as needed.  MDD Moods stable on Celexa  20 mg, continue.   Vitamin D  excess Vitamin D  level at 107, above normal. Risk of kidney stones and constipation. - Decrease vitamin D  to 1000 IU daily. - Consider every other day dosing if supplement is 5000 IU. - Recheck levels in six months.  - simvastatin  (ZOCOR ) 20 MG tablet; Take 1 tablet (20 mg total) by mouth at bedtime.  Dispense: 100 tablet; Refill: 3   Return in about 6 months (around 12/13/2024).    Sharyle Fischer, DO

## 2024-07-20 ENCOUNTER — Encounter: Payer: Self-pay | Admitting: Nurse Practitioner

## 2024-07-20 ENCOUNTER — Other Ambulatory Visit: Payer: Self-pay | Admitting: Internal Medicine

## 2024-07-20 ENCOUNTER — Ambulatory Visit (INDEPENDENT_AMBULATORY_CARE_PROVIDER_SITE_OTHER): Admitting: Nurse Practitioner

## 2024-07-20 VITALS — BP 124/70 | HR 71 | Resp 16 | Ht 64.0 in | Wt 149.0 lb

## 2024-07-20 DIAGNOSIS — R2231 Localized swelling, mass and lump, right upper limb: Secondary | ICD-10-CM | POA: Diagnosis not present

## 2024-07-20 NOTE — Progress Notes (Signed)
 BP 124/70   Pulse 71   Resp 16   Ht 5' 4 (1.626 m)   Wt 149 lb (67.6 kg)   SpO2 95%   BMI 25.58 kg/m    Subjective:    Patient ID: Lisa Caldwell, female    DOB: 1946-10-17, 78 y.o.   MRN: [id number]  HPI: Lisa Caldwell is a 78 y.o. female  Chief Complaint  Patient presents with   Cyst    R armpit, x1 week. Pain w/touch.    Discussed the use of AI scribe software for clinical note transcription with the patient, who gave verbal consent to proceed.  History of Present Illness Lisa Caldwell is a 78 year old female who presents with a firm knot in her right breast.  Right breast mass - Firm knot in the right axillary next to right breast present for approximately two weeks - Area is sore but has not changed in size since onset - No drainage or significant heat from the area  Breast imaging history - Diagnostic mammogram on April 27, 2024 was negative - No breast ultrasound performed at that time         03/01/2024    2:43 PM 11/28/2023    8:33 AM 05/27/2023   12:56 PM  Depression screen PHQ 2/9  Decreased Interest 0 0 0  Down, Depressed, Hopeless 1 0 0  PHQ - 2 Score 1 0 0  Altered sleeping 0  0  Tired, decreased energy 1  0  Change in appetite 0  0  Feeling bad or failure about yourself  0  0  Trouble concentrating 0  0  Moving slowly or fidgety/restless 0  0  Suicidal thoughts 0  0  PHQ-9 Score 2  0  Difficult doing work/chores Not difficult at all  Not difficult at all    Relevant past medical, surgical, family and social history reviewed and updated as indicated. Interim medical history since our last visit reviewed. Allergies and medications reviewed and updated.  Review of Systems  Ten systems reviewed and is negative except as mentioned in HPI      Objective:      BP 124/70   Pulse 71   Resp 16   Ht 5' 4 (1.626 m)   Wt 149 lb (67.6 kg)   SpO2 95%   BMI 25.58 kg/m    Wt Readings from Last 3 Encounters:  07/20/24 149 lb (67.6 kg)   06/12/24 152 lb 11.2 oz (69.3 kg)  03/21/24 154 lb (69.9 kg)    Physical Exam GENERAL: Alert, cooperative, well developed, no acute distress. HEENT: Normocephalic, normal oropharynx, moist mucous membranes. CHEST: Clear to auscultation bilaterally, no wheezes, rhonchi, or crackles. CARDIOVASCULAR: Normal heart rate and rhythm, S1 and S2 normal without murmurs. BREAST: Right axillary breast mass firm on palpation, approximately 2x2 not hot to touch. ABDOMEN: Soft, non-tender, non-distended, without organomegaly, normal bowel sounds. EXTREMITIES: No cyanosis or edema. NEUROLOGICAL: Cranial nerves grossly intact, moves all extremities without gross motor or sensory deficit.  Results for orders placed or performed in visit on 11/28/23  CBC w/Diff/Platelet   Collection Time: 11/28/23  8:59 AM  Result Value Ref Range   WBC 6.0 3.8 - 10.8 Thousand/uL   RBC 3.91 3.80 - 5.10 Million/uL   Hemoglobin 12.8 11.7 - 15.5 g/dL   HCT 60.2 64.9 - 54.9 %   MCV 101.5 (H) 80.0 - 100.0 fL   MCH 32.7 27.0 - 33.0 pg   MCHC  32.2 32.0 - 36.0 g/dL   RDW 88.4 88.9 - 84.9 %   Platelets 242 140 - 400 Thousand/uL   MPV 10.5 7.5 - 12.5 fL   Neutro Abs 3,570 1,500 - 7,800 cells/uL   Absolute Lymphocytes 1,668 850 - 3,900 cells/uL   Absolute Monocytes 492 200 - 950 cells/uL   Eosinophils Absolute 222 15 - 500 cells/uL   Basophils Absolute 48 0 - 200 cells/uL   Neutrophils Relative % 59.5 %   Total Lymphocyte 27.8 %   Monocytes Relative 8.2 %   Eosinophils Relative 3.7 %   Basophils Relative 0.8 %  COMPLETE METABOLIC PANEL WITH GFR   Collection Time: 11/28/23  8:59 AM  Result Value Ref Range   Glucose, Bld 102 (H) 65 - 99 mg/dL   BUN 13 7 - 25 mg/dL   Creat 9.27 9.39 - 8.99 mg/dL   eGFR 86 > OR = 60 fO/fpw/8.26f7   BUN/Creatinine Ratio SEE NOTE: 6 - 22 (calc)   Sodium 140 135 - 146 mmol/L   Potassium 4.2 3.5 - 5.3 mmol/L   Chloride 106 98 - 110 mmol/L   CO2 28 20 - 32 mmol/L   Calcium 9.5 8.6 -  10.4 mg/dL   Total Protein 7.0 6.1 - 8.1 g/dL   Albumin 4.2 3.6 - 5.1 g/dL   Globulin 2.8 1.9 - 3.7 g/dL (calc)   AG Ratio 1.5 1.0 - 2.5 (calc)   Total Bilirubin 0.5 0.2 - 1.2 mg/dL   Alkaline phosphatase (APISO) 68 37 - 153 U/L   AST 19 10 - 35 U/L   ALT 14 6 - 29 U/L  Lipid Profile   Collection Time: 11/28/23  8:59 AM  Result Value Ref Range   Cholesterol 192 <200 mg/dL   HDL 68 > OR = 50 mg/dL   Triglycerides 893 <849 mg/dL   LDL Cholesterol (Calc) 104 (H) mg/dL (calc)   Total CHOL/HDL Ratio 2.8 <5.0 (calc)   Non-HDL Cholesterol (Calc) 124 <130 mg/dL (calc)  Vitamin D  (25 hydroxy)   Collection Time: 11/28/23  8:59 AM  Result Value Ref Range   Vit D, 25-Hydroxy 107 (H) 30 - 100 ng/mL  Vitamin B12   Collection Time: 11/28/23  8:59 AM  Result Value Ref Range   Vitamin B-12 727 200 - 1,100 pg/mL          Assessment & Plan:   Problem List Items Addressed This Visit   None Visit Diagnoses       Axillary lump, right    -  Primary   Relevant Orders   US  LIMITED ULTRASOUND INCLUDING AXILLA RIGHT BREAST   MM 3D DIAGNOSTIC MAMMOGRAM UNILATERAL RIGHT BREAST        Assessment and Plan Assessment & Plan Right breast mass Reports a firm mass in the right breast for several weeks. Absence of drainage or increased warmth reduces the likelihood of an abscess. Differential diagnosis includes other benign conditions. Previous diagnostic mammogram in July 2025 was negative. Further imaging is required to determine the nature of the mass. - Order diagnostic right breast mammogram and ultrasound.        Follow up plan: Return if symptoms worsen or fail to improve.

## 2024-07-20 NOTE — Telephone Encounter (Signed)
 Requested Prescriptions  Refused Prescriptions Disp Refills   metoprolol  succinate (TOPROL -XL) 25 MG 24 hr tablet [Pharmacy Med Name: Metoprolol  Succinate ER 25 MG Oral Tablet Extended Release 24 Hour] 200 tablet 2    Sig: TAKE 2 TABLETS BY MOUTH DAILY     Cardiovascular:  Beta Blockers Passed - 07/20/2024  4:50 PM      Passed - Last BP in normal range    BP Readings from Last 1 Encounters:  07/20/24 124/70         Passed - Last Heart Rate in normal range    Pulse Readings from Last 1 Encounters:  07/20/24 71         Passed - Valid encounter within last 6 months    Recent Outpatient Visits           Today Axillary lump, right   [facility] Gareth Mliss FALCON, FNP   1 month ago Hypercholesterolemia   [facility] Bernardo Fend, DO   7 months ago Hypertension, unspecified type   [facility] Bernardo Fend, [place]

## 2024-07-23 ENCOUNTER — Other Ambulatory Visit: Payer: Self-pay | Admitting: Nurse Practitioner

## 2024-07-23 ENCOUNTER — Ambulatory Visit
Admission: RE | Admit: 2024-07-23 | Discharge: 2024-07-23 | Disposition: A | Source: Ambulatory Visit | Attending: Nurse Practitioner

## 2024-07-23 ENCOUNTER — Ambulatory Visit
Admission: RE | Admit: 2024-07-23 | Discharge: 2024-07-23 | Disposition: A | Discharge status: Home / Self Care | Source: Ambulatory Visit | Attending: Nurse Practitioner | Admitting: Nurse Practitioner

## 2024-07-23 DIAGNOSIS — N6311 Unspecified lump in the right breast, upper outer quadrant: Secondary | ICD-10-CM | POA: Diagnosis not present

## 2024-07-23 DIAGNOSIS — R2231 Localized swelling, mass and lump, right upper limb: Secondary | ICD-10-CM | POA: Insufficient documentation

## 2024-07-23 DIAGNOSIS — R92333 Mammographic heterogeneous density, bilateral breasts: Secondary | ICD-10-CM | POA: Diagnosis not present

## 2024-07-23 DIAGNOSIS — R928 Other abnormal and inconclusive findings on diagnostic imaging of breast: Secondary | ICD-10-CM

## 2024-07-23 DIAGNOSIS — R590 Localized enlarged lymph nodes: Secondary | ICD-10-CM | POA: Insufficient documentation

## 2024-07-23 DIAGNOSIS — N6331 Unspecified lump in axillary tail of the right breast: Secondary | ICD-10-CM | POA: Diagnosis not present

## 2024-07-24 ENCOUNTER — Other Ambulatory Visit: Payer: Self-pay | Admitting: Internal Medicine

## 2024-07-24 DIAGNOSIS — K219 Gastro-esophageal reflux disease without esophagitis: Secondary | ICD-10-CM

## 2024-07-25 ENCOUNTER — Ambulatory Visit
Admission: RE | Admit: 2024-07-25 | Discharge: 2024-07-25 | Disposition: A | Discharge status: Home / Self Care | Source: Ambulatory Visit | Attending: Nurse Practitioner | Admitting: Nurse Practitioner

## 2024-07-25 DIAGNOSIS — R599 Enlarged lymph nodes, unspecified: Secondary | ICD-10-CM | POA: Diagnosis not present

## 2024-07-25 DIAGNOSIS — A281 Cat-scratch disease: Secondary | ICD-10-CM | POA: Insufficient documentation

## 2024-07-25 DIAGNOSIS — R928 Other abnormal and inconclusive findings on diagnostic imaging of breast: Secondary | ICD-10-CM | POA: Diagnosis not present

## 2024-07-25 DIAGNOSIS — R229 Localized swelling, mass and lump, unspecified: Secondary | ICD-10-CM | POA: Diagnosis not present

## 2024-07-25 DIAGNOSIS — L042 Acute lymphadenitis of upper limb: Secondary | ICD-10-CM | POA: Diagnosis not present

## 2024-07-25 DIAGNOSIS — L041 Acute lymphadenitis of trunk: Secondary | ICD-10-CM | POA: Diagnosis not present

## 2024-07-25 HISTORY — PX: BREAST BIOPSY: SHX20

## 2024-07-25 MED ORDER — LIDOCAINE 1 % OPTIME INJ - NO CHARGE
2.0000 mL | Freq: Once | INTRAMUSCULAR | Status: AC
  Administered 2024-07-25: 2 mL via INTRADERMAL
  Filled 2024-07-25: qty 2

## 2024-07-25 MED ORDER — LIDOCAINE-EPINEPHRINE 1 %-1:100000 IJ SOLN
10.0000 mL | Freq: Once | INTRAMUSCULAR | Status: AC
Start: 2024-07-25 — End: 2024-07-25
  Administered 2024-07-25: 10 mL via INTRADERMAL
  Filled 2024-07-25: qty 10

## 2024-07-25 MED ORDER — LIDOCAINE-EPINEPHRINE 1 %-1:100000 IJ SOLN
10.0000 mL | Freq: Once | INTRAMUSCULAR | Status: AC
  Administered 2024-07-25: 10 mL via INTRADERMAL
  Filled 2024-07-25: qty 10

## 2024-07-26 NOTE — Telephone Encounter (Signed)
 Rx 02/21/24 #100 1RF- too soon Requested Prescriptions  Pending Prescriptions Disp Refills   omeprazole  (PRILOSEC) 20 MG capsule [Pharmacy Med Name: Omeprazole  20 MG Oral Capsule Delayed Release] 100 capsule 2    Sig: TAKE 1 CAPSULE BY MOUTH DAILY     Gastroenterology: Proton Pump Inhibitors Passed - 07/26/2024  1:58 PM      Passed - Valid encounter within last 12 months    Recent Outpatient Visits           6 days ago Axillary lump, right   [facility] Gareth Mliss FALCON, FNP   1 month ago Hypercholesterolemia   [facility] Bernardo Fend, DO   8 months ago Hypertension, unspecified type   [facility] Bernardo Fend, [place]

## 2024-07-27 ENCOUNTER — Other Ambulatory Visit: Payer: Self-pay | Admitting: Internal Medicine

## 2024-07-27 ENCOUNTER — Telehealth: Payer: Self-pay | Admitting: Internal Medicine

## 2024-07-27 ENCOUNTER — Encounter: Payer: Self-pay | Admitting: Diagnostic Radiology

## 2024-07-27 ENCOUNTER — Other Ambulatory Visit: Payer: Self-pay

## 2024-07-27 ENCOUNTER — Telehealth: Payer: Self-pay

## 2024-07-27 DIAGNOSIS — I889 Nonspecific lymphadenitis, unspecified: Secondary | ICD-10-CM

## 2024-07-27 LAB — SURGICAL PATHOLOGY

## 2024-07-27 MED ORDER — DOXYCYCLINE HYCLATE 100 MG PO TABS
100.0000 mg | ORAL_TABLET | Freq: Two times a day (BID) | ORAL | 0 refills | Status: AC
Start: 2024-07-27 — End: 2024-08-06

## 2024-07-27 MED ORDER — AZITHROMYCIN 250 MG PO TABS: ORAL_TABLET | ORAL | 0 refills | Status: AC

## 2024-07-27 NOTE — Telephone Encounter (Signed)
 Pt called in due ot missed call. Advised that prescriptions were sent to pharmacy.

## 2024-07-27 NOTE — Telephone Encounter (Signed)
 Rx already sent and patient called and lft vm

## 2024-07-27 NOTE — Telephone Encounter (Signed)
 Copied from CRM [id number]. Topic: Clinical - Lab/Test Results >> Jul 27, 2024  1:48 PM Selinda RAMAN wrote: Reason for CRM: Rock with the [facility] called in to speak with a nurse about the results of a breast biopsy that was done on the patient. She said it involves calling in an antibiotic for the patient based on her results. I called and spoke with Allena and she asked for a good call back number for Rock and I gave her [phone]. She said she will have the nurse call Rock back as soon as she can.

## 2024-07-27 NOTE — Telephone Encounter (Signed)
 Copied from CRM [id number]. Topic: General - Other >> Jul 27, 2024  2:32 PM Kevelyn M wrote: Reason for CRM: Rock is about to leave for the day. She is calling with [facility] She's requesting to check this patient's chart,requesting to call in an antibiotic for doxycycline for patient breast biopsy results.  [id number] [address], [facility] [address], [place] [address] Phone: [phone]  Fax: [phone]

## 2024-07-30 ENCOUNTER — Other Ambulatory Visit: Payer: Self-pay | Admitting: Nurse Practitioner

## 2024-07-30 DIAGNOSIS — R6889 Other general symptoms and signs: Secondary | ICD-10-CM

## 2024-08-10 ENCOUNTER — Other Ambulatory Visit: Payer: Self-pay | Admitting: Nurse Practitioner

## 2024-08-10 ENCOUNTER — Other Ambulatory Visit: Payer: Self-pay | Admitting: Internal Medicine

## 2024-08-10 ENCOUNTER — Ambulatory Visit
Admission: RE | Admit: 2024-08-10 | Discharge: 2024-08-10 | Disposition: A | Discharge status: Home / Self Care | Source: Ambulatory Visit | Attending: Nurse Practitioner | Admitting: Nurse Practitioner

## 2024-08-10 ENCOUNTER — Ambulatory Visit

## 2024-08-10 DIAGNOSIS — R928 Other abnormal and inconclusive findings on diagnostic imaging of breast: Secondary | ICD-10-CM

## 2024-08-13 ENCOUNTER — Ambulatory Visit: Payer: Self-pay | Admitting: Internal Medicine

## 2024-08-13 ENCOUNTER — Other Ambulatory Visit: Payer: Self-pay | Admitting: Internal Medicine

## 2024-08-13 DIAGNOSIS — F3342 Major depressive disorder, recurrent, in full remission: Secondary | ICD-10-CM

## 2024-08-15 NOTE — Telephone Encounter (Signed)
 Requested Prescriptions  Pending Prescriptions Disp Refills   citalopram  (CELEXA ) 20 MG tablet [Pharmacy Med Name: Citalopram  Hydrobromide 20 MG Oral Tablet] 100 tablet 0    Sig: TAKE 1 TABLET BY MOUTH DAILY     Psychiatry:  Antidepressants - SSRI Passed - 08/15/2024 12:08 PM      Passed - Completed PHQ-2 or PHQ-9 in the last 360 days      Passed - Valid encounter within last 6 months    Recent Outpatient Visits           3 weeks ago Axillary lump, right   [facility] Gareth Mliss FALCON, FNP   2 months ago Hypercholesterolemia   [facility] Bernardo Fend, DO   8 months ago Hypertension, unspecified type   [facility] Bernardo Fend, [place]

## 2024-08-22 ENCOUNTER — Ambulatory Visit: Payer: Self-pay | Admitting: Internal Medicine

## 2024-08-22 NOTE — Telephone Encounter (Signed)
 FYI Only or Action Required?: FYI only for provider: appointment scheduled on 08/23/2024.  Patient was last seen in primary care on 07/20/2024 by Gareth Mliss FALCON, FNP.  Called Nurse Triage reporting Animal Bite.  Symptoms began about a month ago.  Interventions attempted: Prescription medications: azithromycin (ZITHROMAX) 250 MG tablet (prescribed on 07/27/2024).  Symptoms are: gradually improving.  Triage Disposition: See Physician Within 24 Hours  Patient/caregiver understands and will follow disposition?: Yes         Copied from CRM #[id number]. Topic: Clinical - Red Word Triage >> Aug 22, 2024 10:53 AM Shanda MATSU wrote: Red Word that prompted transfer to Nurse Triage: Patient reporting itching from diagnosis of cat scratch fever is not improving. Reason for Disposition  [1] Taking antibiotic > 72 hours (3 days) AND [2] wound infection symptoms SAME (e.g., draining pus, pain, red streak, spreading redness, swelling)  Answer Assessment - Initial Assessment Questions Pt was diagnosed with cat scratch fever a couple of weeks ago and was treated with antibiotics Pt had a knot on right breast that has now gone away Itching on hands, feet, arms (the same as it was) Denies fever, signs of infection  Protocols used: Animal Bite on Antibiotic Follow-up Call-A-[facility]

## 2024-08-23 ENCOUNTER — Ambulatory Visit: Admitting: Internal Medicine

## 2024-08-23 ENCOUNTER — Encounter: Payer: Self-pay | Admitting: Internal Medicine

## 2024-08-23 ENCOUNTER — Other Ambulatory Visit: Payer: Self-pay

## 2024-08-23 VITALS — BP 130/70 | HR 85 | Temp 98.3°F | Resp 16 | Ht 64.0 in | Wt 149.6 lb

## 2024-08-23 DIAGNOSIS — L299 Pruritus, unspecified: Secondary | ICD-10-CM

## 2024-08-23 MED ORDER — CETIRIZINE HCL 10 MG PO TABS
10.0000 mg | ORAL_TABLET | Freq: Every day | ORAL | 11 refills | Status: AC
Start: 2024-08-23 — End: ?

## 2024-08-23 NOTE — Progress Notes (Signed)
 Acute Office Visit  Subjective:     Patient ID: Lisa Caldwell, female    DOB: [birth year], 78 y.o.   MRN: [id number]  Chief Complaint  Patient presents with   Follow-up    Cat scratch   Pruritis    All over    HPI Patient is in today for follow up on recent mammogram.   Discussed the use of AI scribe software for clinical note transcription with the patient, who gave verbal consent to proceed.  History of Present Illness Lisa Caldwell is a 78 year old female who presents with persistent itching.  She experiences persistent itching on her arms, hands, legs, and feet, with redness appearing only after scratching. The itching does not resolve with scratching and sometimes worsens. There are no new medications or changes in her routine that could account for the itching.  She takes Benadryl to manage the itching, which provides some relief but causes sedation. She has not been taking any other antihistamines regularly. She has used topical Benadryl cream but is concerned about its usage alongside oral Benadryl.  She has a history of using steroids for severe itching related to poison oak, treated with a steroid shot followed by oral steroids. She is not using any steroids for the current itching.  She has a history of cat scratch lymphadenitis, diagnosed following a biopsy of an enlarged lymph node in the right axilla, consistent with cat scratch fever. She was prescribed antibiotics after the diagnosis and was informed there was no evidence of malignancy.   Review of Systems  Constitutional:  Negative for chills and fever.  Skin:  Positive for itching. Negative for rash.        Objective:    BP 130/70 (Cuff Size: Large)   Pulse 85   Temp 98.3 F (36.8 C) (Oral)   Resp 16   Ht 5' 4 (1.626 m)   Wt 149 lb 9.6 oz (67.9 kg)   SpO2 98%   BMI 25.68 kg/m    Physical Exam Constitutional:      Appearance: Normal appearance.  HENT:     Head: Normocephalic and  atraumatic.  Eyes:     Conjunctiva/sclera: Conjunctivae normal.  Cardiovascular:     Rate and Rhythm: Normal rate and regular rhythm.  Pulmonary:     Effort: Pulmonary effort is normal.     Breath sounds: Normal breath sounds.  Skin:    General: Skin is warm and dry.     Comments: Small erythematous area on left forearm from itching  Neurological:     General: No focal deficit present.     Mental Status: She is alert. Mental status is at baseline.  Psychiatric:        Mood and Affect: Mood normal.        Behavior: Behavior normal.     No results found for any visits on 08/23/24.      Assessment & Plan:   Assessment & Plan Generalized pruritus Chronic itching on hands, arms, and feet, likely due to histamine release. No rash, only redness from scratching. Benadryl provides relief but causes sedation. Zyrtec  prescribed. - Take Zyrtec  daily. - Use topical Benadryl cream as needed. - Take Benadryl pill if itching persists. - Apply fragrance-free moisturizer twice daily. - Consider steroid shot if severe itching persists.  - cetirizine  (ZYRTEC ) 10 MG tablet; Take 1 tablet (10 mg total) by mouth daily.  Dispense: 30 tablet; Refill: 11   Return for already scheduled.  Sharyle  Bernardo, DO

## 2024-08-29 ENCOUNTER — Other Ambulatory Visit: Payer: Self-pay | Admitting: Internal Medicine

## 2024-08-29 DIAGNOSIS — K219 Gastro-esophageal reflux disease without esophagitis: Secondary | ICD-10-CM

## 2024-08-29 DIAGNOSIS — J309 Allergic rhinitis, unspecified: Secondary | ICD-10-CM

## 2024-08-29 DIAGNOSIS — I480 Paroxysmal atrial fibrillation: Secondary | ICD-10-CM

## 2024-08-31 NOTE — Telephone Encounter (Signed)
 Requested Prescriptions  Pending Prescriptions Disp Refills   fluticasone  (FLONASE ) 50 MCG/ACT nasal spray [Pharmacy Med Name: Fluticasone  Propionate 50 MCG/ACT Nasal Suspension] 64 g 0    Sig: USE 2 SPRAYS IN BOTH NOSTRILS  DAILY     Ear, Nose, and Throat: Nasal Preparations - Corticosteroids Passed - 08/31/2024 11:41 AM      Passed - Valid encounter within last 12 months    Recent Outpatient Visits           1 week ago Itching   [facility] Bernardo Fend, DO   1 month ago Axillary lump, right   [facility] Gareth Mliss FALCON, FNP   2 months ago Hypercholesterolemia   [facility] Fend, DO   9 months ago Hypertension, unspecified type   [facility] Bernardo Fend, DO               metoprolol  succinate (TOPROL -XL) 25 MG 24 hr tablet [Pharmacy Med Name: Metoprolol  Succinate ER 25 MG Oral Tablet Extended Release 24 Hour] 200 tablet 0    Sig: TAKE 2 TABLETS BY MOUTH DAILY     Cardiovascular:  Beta Blockers Passed - 08/31/2024 11:41 AM      Passed - Last BP in normal range    BP Readings from Last 1 Encounters:  08/23/24 130/70         Passed - Last Heart Rate in normal range    Pulse Readings from Last 1 Encounters:  08/23/24 85         Passed - Valid encounter within last 6 months    Recent Outpatient Visits           1 week ago Itching   [facility] Bernardo Fend, DO   1 month ago Axillary lump, right   [facility] Gareth Mliss FALCON, FNP   2 months ago Hypercholesterolemia   [facility] Bernardo Fend, DO   9 months ago Hypertension, unspecified type   [facility] Bernardo Fend, DO               apixaban  (ELIQUIS ) 5 MG TABS tablet [Pharmacy Med Name: Eliquis  5 MG Oral Tablet] 200 tablet 0    Sig: TAKE 1 TABLET BY  MOUTH TWICE  DAILY     Hematology:  Anticoagulants - apixaban  Passed - 08/31/2024 11:41 AM      Passed - PLT in normal range and within 360 days    Platelets  Date Value Ref Range Status  11/28/2023 242 140 - 400 Thousand/uL Final  06/12/2021 248 150 - 450 x10E3/uL Final         Passed - HGB in normal range and within 360 days    Hemoglobin  Date Value Ref Range Status  11/28/2023 12.8 11.7 - 15.5 g/dL Final  91/73/7977 86.7 11.1 - 15.9 g/dL Final         Passed - HCT in normal range and within 360 days    HCT  Date Value Ref Range Status  11/28/2023 39.7 35.0 - 45.0 % Final   Hematocrit  Date Value Ref Range Status  06/12/2021 39.4 34.0 - 46.6 % Final         Passed - Cr in normal range and within 360 days    Creat  Date Value Ref Range Status  11/28/2023 0.72 0.60 - 1.00 mg/dL Final  Passed - AST in normal range and within 360 days    AST  Date Value Ref Range Status  11/28/2023 19 10 - 35 U/L Final   SGOT(AST)  Date Value Ref Range Status  11/29/2011 36 15 - 37 Unit/L Final         Passed - ALT in normal range and within 360 days    ALT  Date Value Ref Range Status  11/28/2023 14 6 - 29 U/L Final   SGPT (ALT)  Date Value Ref Range Status  11/29/2011 44 U/L Final    Comment:    12-78 NOTE: NEW REFERENCE RANGE 09/10/2011          Passed - Valid encounter within last 12 months    Recent Outpatient Visits           1 week ago Itching   [facility] Bernardo Fend, DO   1 month ago Axillary lump, right   [facility] Gareth Mliss FALCON, FNP   2 months ago Hypercholesterolemia   [facility] Bernardo Fend, DO   9 months ago Hypertension, unspecified type   [facility] Bernardo Fend, DO               omeprazole  (PRILOSEC) 20 MG capsule [Pharmacy Med Name: Omeprazole  20 MG Oral Capsule Delayed Release] 100 capsule 0     Sig: TAKE 1 CAPSULE BY MOUTH DAILY     Gastroenterology: Proton Pump Inhibitors Passed - 08/31/2024 11:41 AM      Passed - Valid encounter within last 12 months    Recent Outpatient Visits           1 week ago Itching   [facility] Bernardo Fend, DO   1 month ago Axillary lump, right   [facility] Gareth Mliss FALCON, FNP   2 months ago Hypercholesterolemia   [facility] Bernardo Fend, DO   9 months ago Hypertension, unspecified type   [facility] Bernardo Fend, [place]

## 2024-09-25 NOTE — Progress Notes (Unsigned)
   Cardiology Clinic Note   Date: 09/25/2024 ID: [name], DOB 1946/04/24, MRN [id number]  Primary Cardiologist:  Lisa Cage, MD  Chief Complaint   Lisa Caldwell is a 78 y.o. female who presents to the clinic today for ***  Patient Profile   Lisa Caldwell is followed by *** for the history outlined below.       Past medical history significant for: PAF. Onset 2022. Echo 07/13/2023: EF 60 to 65%.  No RWMA.  Normal diastolic parameters.  Normal RV size/function.  Mild MR. Hypertension. Hyperlipidemia. Lipid panel 11/28/2023: LDL 104, HDL 68, TG 106, total 192. GERD.  In summary, patient was first evaluated by Dr. Cage on 06/14/2023 to establish care.  She had previously been followed by Dr. Britta for A-fib.  Patient presented to [facility] ED in 2022 with dizziness and was found to be in A-fib with RVR.  She converted to sinus rhythm with diltiazem  and was placed on Eliquis  and Toprol .  She has not had recurrent A-fib since then.  She reported occasional DOE.  She underwent echo which demonstrated normal LV/RV function.   Patient was last seen in the office by me on 03/21/2024 for routine follow-up.  She reported occasional DOE resolving with rest.  She was very active doing aerobics and weightlifting 5 days a week as well as light to moderate yard work without episodes of dyspnea.     History of Present Illness    Today, patient ***  PAF Onset 2022 with no recurrence.  Denies spontaneous bleeding concerns.  Patient denies palpitations. EKG today shows*** - Continue Toprol , Eliquis .   Hypertension BP today***. No report of headaches or dizziness.  - Continue Toprol .   Hyperlipidemia LDL 104 February 2025. - Continue simvastatin . - Continue to follow with PCP.  ROS: All other systems reviewed and are otherwise negative except as noted in History of Present Illness.  EKGs/Labs Reviewed        11/28/2023: ALT 14; AST 19; BUN 13; Creat 0.72; Potassium 4.2; Sodium 140    11/28/2023: Hemoglobin 12.8; WBC 6.0   No results found for requested labs within last 365 days.   No results found for requested labs within last 365 days.  ***  Risk Assessment/Calculations    {Does this patient have ATRIAL FIBRILLATION?:[id number]} No BP recorded.  {Refresh Note OR Click here to enter BP  :1}***        Physical Exam    VS:  There were no vitals taken for this visit. , BMI There is no height or weight on file to calculate BMI.  GEN: Well nourished, well developed, in no acute distress. Neck: No JVD or carotid bruits. Cardiac: *** RRR. *** No murmur. No rubs or gallops.   Respiratory:  Respirations regular and unlabored. Clear to auscultation without rales, wheezing or rhonchi. GI: Soft, nontender, nondistended. Extremities: Radials/DP/PT 2+ and equal bilaterally. No clubbing or cyanosis. No edema ***  Skin: Warm and dry, no rash. Neuro: Strength intact.  Assessment & Plan   ***  Disposition: ***     {Are you ordering a CV Procedure (e.g. stress test, cath, DCCV, TEE, etc)?   Press F2        :[id number]}   Signed, Lisa HERO. Jermal Dismuke, DNP, NP-C

## 2024-09-28 ENCOUNTER — Encounter: Payer: Self-pay | Admitting: Student

## 2024-09-28 ENCOUNTER — Ambulatory Visit: Attending: Student | Admitting: Student

## 2024-09-28 VITALS — BP 123/70 | HR 70 | Ht 64.0 in | Wt 153.4 lb

## 2024-09-28 DIAGNOSIS — E7800 Pure hypercholesterolemia, unspecified: Secondary | ICD-10-CM

## 2024-09-28 DIAGNOSIS — I10 Essential (primary) hypertension: Secondary | ICD-10-CM | POA: Diagnosis not present

## 2024-09-28 DIAGNOSIS — I480 Paroxysmal atrial fibrillation: Secondary | ICD-10-CM

## 2024-09-28 NOTE — Patient Instructions (Addendum)
 Medication Instructions:   Your physician recommends that you continue on your current medications as directed. Please refer to the Current Medication list given to you today.    *If you need a refill on your cardiac medications before your next appointment, please call your pharmacy*  Lab Work:  None ordered at this time   If you have labs (blood work) drawn today and your tests are completely normal, you will receive your results only by:  MyChart Message (if you have MyChart) OR  A paper copy in the mail If you have any lab test that is abnormal or we need to change your treatment, we will call you to review the results.  Testing/Procedures:  None ordered at this time   Referrals:  None ordered at this time   Follow-Up:  At [facility], you and your health needs are our priority.  As part of our continuing mission to provide you with exceptional heart care, our providers are all part of one team.  This team includes your primary Cardiologist (physician) and Advanced Practice Providers or APPs (Physician Assistants and Nurse Practitioners) who all work together to provide you with the care you need, when you need it.  Your next appointment:   5 - 6 month(s)  Provider:    Deatrice Cage, MD or Barnie Hila, NP    We recommend signing up for the patient portal called MyChart.  Sign up information is provided on this After Visit Summary.  MyChart is used to connect with patients for Virtual Visits (Telemedicine).  Patients are able to view lab/test results, encounter notes, upcoming appointments, etc.  Non-urgent messages can be sent to your provider as well.   To learn more about what you can do with MyChart, go to [email or web address].

## 2024-11-20 ENCOUNTER — Other Ambulatory Visit: Payer: Self-pay | Admitting: Internal Medicine

## 2024-11-20 DIAGNOSIS — I480 Paroxysmal atrial fibrillation: Secondary | ICD-10-CM

## 2024-11-20 DIAGNOSIS — F3342 Major depressive disorder, recurrent, in full remission: Secondary | ICD-10-CM

## 2024-11-20 DIAGNOSIS — K219 Gastro-esophageal reflux disease without esophagitis: Secondary | ICD-10-CM

## 2024-11-22 NOTE — Telephone Encounter (Signed)
 Requested by interface surescripts. Future visit 12/13/24.  Requested Prescriptions  Pending Prescriptions Disp Refills   ELIQUIS  5 MG TABS tablet [Pharmacy Med Name: Eliquis  5 MG Oral Tablet] 200 tablet 0    Sig: TAKE 1 TABLET BY MOUTH TWICE  DAILY     Hematology:  Anticoagulants - apixaban  Passed - 11/22/2024  2:08 PM      Passed - PLT in normal range and within 360 days    Platelets  Date Value Ref Range Status  11/28/2023 242 140 - 400 Thousand/uL Final  06/12/2021 248 150 - 450 x10E3/uL Final         Passed - HGB in normal range and within 360 days    Hemoglobin  Date Value Ref Range Status  11/28/2023 12.8 11.7 - 15.5 g/dL Final  91/73/7977 86.7 11.1 - 15.9 g/dL Final         Passed - HCT in normal range and within 360 days    HCT  Date Value Ref Range Status  11/28/2023 39.7 35.0 - 45.0 % Final   Hematocrit  Date Value Ref Range Status  06/12/2021 39.4 34.0 - 46.6 % Final         Passed - Cr in normal range and within 360 days    Creat  Date Value Ref Range Status  11/28/2023 0.72 0.60 - 1.00 mg/dL Final         Passed - AST in normal range and within 360 days    AST  Date Value Ref Range Status  11/28/2023 19 10 - 35 U/L Final   SGOT(AST)  Date Value Ref Range Status  11/29/2011 36 15 - 37 Unit/L Final         Passed - ALT in normal range and within 360 days    ALT  Date Value Ref Range Status  11/28/2023 14 6 - 29 U/L Final   SGPT (ALT)  Date Value Ref Range Status  11/29/2011 44 U/L Final    Comment:    12-78 NOTE: NEW REFERENCE RANGE 09/10/2011          Passed - Valid encounter within last 12 months    Recent Outpatient Visits           3 months ago Itching   [facility] Bernardo Fend, DO   4 months ago Axillary lump, right   [facility] Gareth Mliss FALCON, FNP   5 months ago Hypercholesterolemia   [facility]ardo Fend, DO   12 months ago  Hypertension, unspecified type   [facility] Bernardo Fend, DO               metoprolol  succinate (TOPROL -XL) 25 MG 24 hr tablet [Pharmacy Med Name: Metoprolol  Succinate ER 25 MG Oral Tablet Extended Release 24 Hour] 200 tablet 0    Sig: TAKE 2 TABLETS BY MOUTH DAILY     Cardiovascular:  Beta Blockers Passed - 11/22/2024  2:08 PM      Passed - Last BP in normal range    BP Readings from Last 1 Encounters:  09/28/24 123/70         Passed - Last Heart Rate in normal range    Pulse Readings from Last 1 Encounters:  09/28/24 70         Passed - Valid encounter within last 6 months    Recent Outpatient Visits           3  months ago Itching   [facility] Bernardo Fend, DO   4 months ago Axillary lump, right   [facility] Gareth Mliss FALCON, FNP   5 months ago Hypercholesterolemia   [facility] Bernardo Fend, DO   12 months ago Hypertension, unspecified type   [facility] Bernardo Fend, DO               citalopram  (CELEXA ) 20 MG tablet [Pharmacy Med Name: Citalopram  Hydrobromide 20 MG Oral Tablet] 100 tablet 0    Sig: TAKE 1 TABLET BY MOUTH DAILY     Psychiatry:  Antidepressants - SSRI Passed - 11/22/2024  2:08 PM      Passed - Completed PHQ-2 or PHQ-9 in the last 360 days      Passed - Valid encounter within last 6 months    Recent Outpatient Visits           3 months ago Itching   [facility] Bernardo Fend, DO   4 months ago Axillary lump, right   [facility] Gareth Mliss FALCON, FNP   5 months ago Hypercholesterolemia   [facility] Bernardo Fend, DO   12 months ago Hypertension, unspecified type   [facility] Bernardo Fend, DO               omeprazole  (PRILOSEC) 20 MG capsule [Pharmacy Med  Name: Omeprazole  20 MG Oral Capsule Delayed Release] 100 capsule 2    Sig: TAKE 1 CAPSULE BY MOUTH DAILY     Gastroenterology: Proton Pump Inhibitors Passed - 11/22/2024  2:08 PM      Passed - Valid encounter within last 12 months    Recent Outpatient Visits           3 months ago Itching   [facility] Bernardo Fend, DO   4 months ago Axillary lump, right   [facility] Gareth Mliss FALCON, FNP   5 months ago Hypercholesterolemia   [facility] Bernardo Fend, DO   12 months ago Hypertension, unspecified type   [facility] Bernardo Fend, [place]

## 2024-12-13 ENCOUNTER — Ambulatory Visit: Admitting: Internal Medicine

## 2025-03-21 ENCOUNTER — Ambulatory Visit
